# Patient Record
Sex: Male | Born: 1975
Health system: Southern US, Community
[De-identification: ages and names within clinical notes are randomized; demographics above are authoritative.]

## PROBLEM LIST (undated history)

## (undated) DIAGNOSIS — M545 Low back pain: Secondary | ICD-10-CM

## (undated) DIAGNOSIS — E119 Type 2 diabetes mellitus without complications: Secondary | ICD-10-CM

## (undated) DIAGNOSIS — I10 Essential (primary) hypertension: Secondary | ICD-10-CM

## (undated) HISTORY — PX: HERNIA REPAIR: SHX51

## (undated) HISTORY — DX: Low back pain: M54.5

## (undated) HISTORY — DX: Essential (primary) hypertension: I10

---

## 2010-12-17 DIAGNOSIS — E119 Type 2 diabetes mellitus without complications: Secondary | ICD-10-CM

## 2010-12-17 HISTORY — DX: Type 2 diabetes mellitus without complications: E11.9

## 2013-04-13 ENCOUNTER — Emergency Department (HOSPITAL_COMMUNITY)
Admission: EM | Admit: 2013-04-13 | Discharge: 2013-04-13 | Disposition: A | Discharge status: Home / Self Care | Payer: Self-pay | Attending: Emergency Medicine | Admitting: Emergency Medicine

## 2013-04-13 ENCOUNTER — Encounter (HOSPITAL_COMMUNITY): Payer: Self-pay | Admitting: Emergency Medicine

## 2013-04-13 DIAGNOSIS — Z79899 Other long term (current) drug therapy: Secondary | ICD-10-CM | POA: Insufficient documentation

## 2013-04-13 DIAGNOSIS — M79605 Pain in left leg: Secondary | ICD-10-CM

## 2013-04-13 DIAGNOSIS — IMO0001 Reserved for inherently not codable concepts without codable children: Secondary | ICD-10-CM | POA: Insufficient documentation

## 2013-04-13 DIAGNOSIS — L0390 Cellulitis, unspecified: Secondary | ICD-10-CM

## 2013-04-13 DIAGNOSIS — L02419 Cutaneous abscess of limb, unspecified: Secondary | ICD-10-CM | POA: Insufficient documentation

## 2013-04-13 DIAGNOSIS — I10 Essential (primary) hypertension: Secondary | ICD-10-CM | POA: Insufficient documentation

## 2013-04-13 DIAGNOSIS — M79609 Pain in unspecified limb: Secondary | ICD-10-CM | POA: Insufficient documentation

## 2013-04-13 DIAGNOSIS — E119 Type 2 diabetes mellitus without complications: Secondary | ICD-10-CM | POA: Insufficient documentation

## 2013-04-13 HISTORY — DX: Type 2 diabetes mellitus without complications: E11.9

## 2013-04-13 LAB — GLUCOSE, CAPILLARY
Glucose-Capillary: 301 mg/dL — ABNORMAL HIGH (ref 70–99)
Glucose-Capillary: 200 mg/dL — ABNORMAL HIGH (ref 70–99)

## 2013-04-13 MED ORDER — CEPHALEXIN 500 MG PO CAPS: 500.0000 mg | ORAL_CAPSULE | Freq: Four times a day (QID) | ORAL | Status: DC

## 2013-04-13 MED ORDER — SULFAMETHOXAZOLE-TRIMETHOPRIM 800-160 MG PO TABS: ORAL_TABLET | ORAL | Status: DC

## 2013-04-13 NOTE — ED Notes (Signed)
PA completed US at bedside.  Pt tolerated well.

## 2013-04-13 NOTE — ED Notes (Signed)
Pt c/o left leg pain x 2 days; pt denies obvious injury; small amount of redness noted

## 2013-04-13 NOTE — ED Notes (Addendum)
C/O red area to left thigh about 3in X 4 in. Area is red, warm and swollen. No known injury. Onset 2 days ago.

## 2013-04-13 NOTE — ED Provider Notes (Signed)
History     CSN: [id number]  Arrival date & time 04/13/13  6213   First MD Initiated Contact with Patient 04/13/13 [phone]      Chief Complaint  Patient presents with  . Leg Pain    (Consider location/radiation/quality/duration/timing/severity/associated sxs/prior treatment) HPI Comments: Corey Graham is a 37 y/o M with PMHx of DM and HTN presenting to the ED with left thigh discomfort. Patient reported that when he woke up on Saturday morning he was experiencing discomfort to the lateral aspect of left thigh. Patient reported that pain is described as a constant burning sensation, that worsens with walking due to radiation of pain going up the left leg. Patient reported the has been resting, using Tylenol with minimal relief. Denied fall, trauma, injury. Denied fever, chills, numbness, paresthesias, headache, dizziness, gi symptoms, urinary symptoms, saddle anesthesia, bowel incontinence, loss of sensation, chest pain, shortness of breathe, difficulty breathing.   The history is provided by the patient and the spouse. The history is limited by a language barrier. No language interpreter was used.    Past Medical History  Diagnosis Date  . Diabetes mellitus without complication     History reviewed. No pertinent past surgical history.  History reviewed. No pertinent family history.  History  Substance Use Topics  . Smoking status: Never Smoker   . Smokeless tobacco: Not on file  . Alcohol Use: No      Review of Systems  Constitutional: Negative for fever and chills.  HENT: Negative for ear pain, sore throat, neck pain and neck stiffness.   Eyes: Negative for pain and visual disturbance.  Respiratory: Negative for cough, chest tightness and shortness of breath.   Cardiovascular: Negative for chest pain.  Gastrointestinal: Negative for nausea, vomiting, abdominal pain, diarrhea and constipation.  Genitourinary: Negative for dysuria, hematuria, decreased urine  volume and difficulty urinating.  Musculoskeletal: Positive for myalgias. Negative for back pain.       Left thigh pain  Skin: Negative for rash.  Neurological: Negative for dizziness, weakness, light-headedness, numbness and headaches.  All other systems reviewed and are negative.    Allergies  Review of patient's allergies indicates no known allergies.  Home Medications   Current Outpatient Rx  Name  Route  Sig  Dispense  Refill  . acetaminophen (TYLENOL) 500 MG tablet   Oral   Take 1,000 mg by mouth every 6 (six) hours as needed for pain.         Marland Kitchen lisinopril (PRINIVIL,ZESTRIL) 5 MG tablet   Oral   Take 5 mg by mouth daily.         . metFORMIN (GLUCOPHAGE) 500 MG tablet   Oral   Take 500 mg by mouth 2 (two) times daily with a meal.         . cephALEXin (KEFLEX) 500 MG capsule   Oral   Take 1 capsule (500 mg total) by mouth 4 (four) times daily.   40 capsule   0   . sulfamethoxazole-trimethoprim (BACTRIM DS,SEPTRA DS) 800-160 MG per tablet      One tablet PO BID x 10 days   20 tablet   0     BP 132/99  Pulse 84  Temp(Src) 97.9 F (36.6 C) (Oral)  Resp 16  SpO2 98%  Physical Exam  Nursing note and vitals reviewed. Constitutional: He is oriented to person, place, and time. He appears well-developed and well-nourished. No distress.  HENT:  Head: Normocephalic and atraumatic.  Mouth/Throat: Oropharynx is clear and moist.  No oropharyngeal exudate.  Uvula midline, symmetrical elevation  Eyes: Conjunctivae and EOM are normal. Pupils are equal, round, and reactive to light. Right eye exhibits no discharge. Left eye exhibits no discharge.  Neck: Normal range of motion. Neck supple. No tracheal deviation present.  Negative lymphadenopathy  Cardiovascular: Normal rate, regular rhythm and normal heart sounds.  Exam reveals no friction rub.   No murmur heard. Radial pulses 2+ bilaterally Pedal pulses 2+ bilaterally Negative ankle and leg swelling Negative  pitting edema  Pulmonary/Chest: Effort normal and breath sounds normal. No respiratory distress. He has no wheezes. He has no rales.  Musculoskeletal: He exhibits tenderness. He exhibits no edema.       Legs: Limited ROM to left knee secondary to pain from left lateral aspect of thigh Pain upon palpation to lateral aspect of left thigh Strength 5+/5+ lower extremities bilaterally  Lymphadenopathy:    He has no cervical adenopathy.  Neurological: He is alert and oriented to person, place, and time. No cranial nerve deficit. He exhibits normal muscle tone. Coordination normal.  Sensation intact with differentiation of sharp and dull sensation to lower extremities bilaterally  Skin: Skin is warm and dry. No rash noted. He is not diaphoretic. There is erythema.     Erythema noted to lateral aspect of left thigh, mild swelling noted.   Psychiatric: He has a normal mood and affect. His behavior is normal. Thought content normal.    ED Course  Procedures (including critical care time)  11:40AM [place] bedside performed negative finding of fluid accumulation, no abscess or cyst noted.   Labs Reviewed  GLUCOSE, CAPILLARY - Abnormal; Notable for the following:    Glucose-Capillary 301 (*)    All other components within normal limits  GLUCOSE, CAPILLARY - Abnormal; Notable for the following:    Glucose-Capillary 200 (*)    All other components within normal limits   No results found.   1. Cellulitis   2. Leg pain, left   3. HTN (hypertension)   4. DM (diabetes mellitus)       MDM  I personally evaluated and examined the patient. Patient afebrile, normotensive, non-tachycardic, alert and oriented. Bedside ultrasound performed - no accumulation of fluids noted - r/o abscess. Tenderness upon palpation to the lateral aspect of the left leg - decreased ROM to left knee secondary to pain in left thigh. Discussed findings and case with Dr. Ranae Palms, Dr. Ranae Palms saw and examined patient -  reported case of possible cellulitis - recommended patient be discharged with antibiotics and to follow-up as outpatient. Patient aseptic, non-toxic appearing, in no acute distress. No neurovascular damage noted. No sign of septic joint - left knee mobile, no erythema, no swelling, no warmth to touch, no effusion. No abscess noted from bedside US. Discharged patient. Possible cellulitis. Discharged patient with antibiotics - bactrim and keflex. Discussed with patient to follow-up with urgent care center by the end of this week for re-check. Discussed with patient to rest, elevate leg, ice, no strenuous activity. Note given for work. Discussed with patient to continue taking home medications as prescribed and to monitor glucose levels. Discussed with patient to monitor symptoms and if symptoms are to worsen or change to report back to the ED. Patient agreed to plan of care, understood, all questions answered.         Raymon Mutton, PA-C 04/13/13 2139 Patient's primary language is Spanish - interpreter was wife that was with him who speaks [place].  Raymon Mutton, PA-C 04/14/13 [phone]

## 2013-04-14 NOTE — ED Provider Notes (Signed)
Medical screening examination/treatment/procedure(s) were conducted as a shared visit with non-physician practitioner(s) and myself.  I personally evaluated the patient during the encounter   Loren Racer, MD 04/14/13 1743

## 2014-06-15 ENCOUNTER — Emergency Department (HOSPITAL_COMMUNITY): Payer: Self-pay

## 2014-06-15 ENCOUNTER — Encounter (HOSPITAL_COMMUNITY): Payer: Self-pay | Admitting: Emergency Medicine

## 2014-06-15 ENCOUNTER — Emergency Department (HOSPITAL_COMMUNITY)
Admission: EM | Admit: 2014-06-15 | Discharge: 2014-06-16 | Disposition: A | Discharge status: Home / Self Care | Payer: Self-pay | Attending: Emergency Medicine | Admitting: Emergency Medicine

## 2014-06-15 DIAGNOSIS — Z9119 Patient's noncompliance with other medical treatment and regimen: Secondary | ICD-10-CM | POA: Insufficient documentation

## 2014-06-15 DIAGNOSIS — Z91199 Patient's noncompliance with other medical treatment and regimen due to unspecified reason: Secondary | ICD-10-CM | POA: Insufficient documentation

## 2014-06-15 DIAGNOSIS — E119 Type 2 diabetes mellitus without complications: Secondary | ICD-10-CM | POA: Insufficient documentation

## 2014-06-15 DIAGNOSIS — S9030XA Contusion of unspecified foot, initial encounter: Secondary | ICD-10-CM | POA: Insufficient documentation

## 2014-06-15 DIAGNOSIS — S91115A Laceration without foreign body of left lesser toe(s) without damage to nail, initial encounter: Secondary | ICD-10-CM

## 2014-06-15 DIAGNOSIS — Z91148 Patient's other noncompliance with medication regimen for other reason: Secondary | ICD-10-CM

## 2014-06-15 DIAGNOSIS — R739 Hyperglycemia, unspecified: Secondary | ICD-10-CM

## 2014-06-15 DIAGNOSIS — Z79899 Other long term (current) drug therapy: Secondary | ICD-10-CM | POA: Insufficient documentation

## 2014-06-15 DIAGNOSIS — S91109A Unspecified open wound of unspecified toe(s) without damage to nail, initial encounter: Secondary | ICD-10-CM | POA: Insufficient documentation

## 2014-06-15 DIAGNOSIS — Y929 Unspecified place or not applicable: Secondary | ICD-10-CM | POA: Insufficient documentation

## 2014-06-15 DIAGNOSIS — W64XXXA Exposure to other animate mechanical forces, initial encounter: Secondary | ICD-10-CM | POA: Insufficient documentation

## 2014-06-15 DIAGNOSIS — Z9114 Patient's other noncompliance with medication regimen: Secondary | ICD-10-CM

## 2014-06-15 DIAGNOSIS — Y9389 Activity, other specified: Secondary | ICD-10-CM | POA: Insufficient documentation

## 2014-06-15 DIAGNOSIS — S9032XA Contusion of left foot, initial encounter: Secondary | ICD-10-CM

## 2014-06-15 LAB — I-STAT VENOUS BLOOD GAS, ED
BICARBONATE: 25.1 meq/L — AB (ref 20.0–24.0)
O2 Saturation: 85 %
PCO2 VEN: 41.5 mmHg — AB (ref 45.0–50.0)
PH VEN: 7.389 — AB (ref 7.250–7.300)
PO2 VEN: 50 mmHg — AB (ref 30.0–45.0)
TCO2: 26 mmol/L (ref 0–100)

## 2014-06-15 LAB — CBG MONITORING, ED
GLUCOSE-CAPILLARY: 345 mg/dL — AB (ref 70–99)
Glucose-Capillary: 316 mg/dL — ABNORMAL HIGH (ref 70–99)

## 2014-06-15 LAB — I-STAT CHEM 8, ED
BUN: 13 mg/dL (ref 6–23)
CHLORIDE: 101 meq/L (ref 96–112)
Calcium, Ion: 1.23 mmol/L (ref 1.12–1.23)
Creatinine, Ser: 0.5 mg/dL (ref 0.50–1.35)
Glucose, Bld: 378 mg/dL — ABNORMAL HIGH (ref 70–99)
HCT: 49 % (ref 39.0–52.0)
Hemoglobin: 16.7 g/dL (ref 13.0–17.0)
Potassium: 3.8 mEq/L (ref 3.7–5.3)
SODIUM: 136 meq/L — AB (ref 137–147)
TCO2: 26 mmol/L (ref 0–100)

## 2014-06-15 MED ORDER — CEPHALEXIN 250 MG PO CAPS
500.0000 mg | ORAL_CAPSULE | Freq: Once | ORAL | Status: AC
  Administered 2014-06-15: 500 mg via ORAL
  Filled 2014-06-15: qty 2

## 2014-06-15 MED ORDER — SODIUM CHLORIDE 0.9 % IV BOLUS (SEPSIS)
1000.0000 mL | Freq: Once | INTRAVENOUS | Status: AC
Start: 2014-06-15 — End: 2014-06-15
  Administered 2014-06-15: 1000 mL via INTRAVENOUS

## 2014-06-15 MED ORDER — BACITRACIN 500 UNIT/GM EX OINT
1.0000 "application " | TOPICAL_OINTMENT | Freq: Two times a day (BID) | CUTANEOUS | Status: DC
  Administered 2014-06-15: 1 via TOPICAL

## 2014-06-15 NOTE — ED Provider Notes (Signed)
CSN: [id number]     Arrival date & time 06/15/14  2022 History  This chart was scribed for Wynetta Emery, PA-C working with Rolland Porter, MD by Evon Slack, ED Scribe. This patient was seen in room TR06C/TR06C and the patient's care was started at 8:42 PM.    Chief Complaint  Patient presents with  . Foot Injury   The history is provided by the patient. No language interpreter was used.   HPI Comments: Corey Graham is a 38 y.o. male who presents to the Emergency Department complaining of left food injury. Pt states that a cow stepped on his foot this afternoon. The pain is 5/10, exacerbated by weightbearing. He states that he has take tylenol with some relief. He states that his last tetanus shot was 3 or 4 years ago. Pt also states that he has a history of diabetes. Noncompliant with metformin for over a year. Denies polydipsia or polyuria.  Past Medical History  Diagnosis Date  . Diabetes mellitus without complication    History reviewed. No pertinent past surgical history. History reviewed. No pertinent family history. History  Substance Use Topics  . Smoking status: Never Smoker   . Smokeless tobacco: Not on file  . Alcohol Use: No    Review of Systems  Musculoskeletal: Positive for arthralgias.  Skin: Positive for wound.   A complete 10 system review of systems was obtained and all systems are negative except as noted in the HPI and PMH.     Allergies  Review of patient's allergies indicates no known allergies.  Home Medications   Prior to Admission medications   Medication Sig Start Date End Date Taking? Authorizing Provider  acetaminophen (TYLENOL) 500 MG tablet Take 500 mg by mouth every 6 (six) hours as needed (pain).    Yes Historical Provider, MD  cephALEXin (KEFLEX) 500 MG capsule Take 1 capsule (500 mg total) by mouth 4 (four) times daily. 06/16/14   Nicole Pisciotta, PA-C  metFORMIN (GLUCOPHAGE) 500 MG tablet Take 1 tablet (500 mg total) by mouth  2 (two) times daily with a meal. 06/16/14   Nicole Pisciotta, PA-C  traMADol (ULTRAM) 50 MG tablet Take 1 tablet (50 mg total) by mouth every 6 (six) hours as needed. 06/16/14   Nicole Pisciotta, PA-C   Triage Vitals: BP 140/87  Pulse 81  Temp(Src) 98.1 F (36.7 C) (Oral)  Resp 18  Ht 5\' 5"  (1.651 m)  Wt 176 lb (79.833 kg)  BMI 29.29 kg/m2  SpO2 98%  Physical Exam  Nursing note and vitals reviewed. Constitutional: He is oriented to person, place, and time. He appears well-developed and well-nourished. No distress.  HENT:  Head: Normocephalic and atraumatic.  Right Ear: External ear normal.  Mouth/Throat: Oropharynx is clear and moist.  Eyes: Conjunctivae and EOM are normal. Pupils are equal, round, and reactive to light.  Neck: Normal range of motion. Neck supple. No tracheal deviation present.  Cardiovascular: Normal rate, regular rhythm and intact distal pulses.   Pulmonary/Chest: Effort normal and breath sounds normal. No stridor. No respiratory distress. He has no wheezes. He has no rales. He exhibits no tenderness.  Abdominal: Soft. Bowel sounds are normal. He exhibits no distension and no mass. There is no tenderness. There is no rebound and no guarding.  Musculoskeletal: Normal range of motion.       Feet:  Neurological: He is alert and oriented to person, place, and time.  Skin: Skin is warm and dry.  Psychiatric: He has a normal mood  and affect. His behavior is normal.    ED Course  Procedures (including critical care time) DIAGNOSTIC STUDIES: Oxygen Saturation is 98% on RA, normal by my interpretation.    COORDINATION OF CARE:    Labs Review Labs Reviewed  CBG MONITORING, ED - Abnormal; Notable for the following:    Glucose-Capillary 345 (*)    All other components within normal limits  I-STAT CHEM 8, ED - Abnormal; Notable for the following:    Sodium 136 (*)    Glucose, Bld 378 (*)    All other components within normal limits  I-STAT VENOUS BLOOD GAS, ED -  Abnormal; Notable for the following:    pH, Ven 7.389 (*)    pCO2, Ven 41.5 (*)    pO2, Ven 50.0 (*)    Bicarbonate 25.1 (*)    All other components within normal limits  CBG MONITORING, ED - Abnormal; Notable for the following:    Glucose-Capillary 316 (*)    All other components within normal limits  BLOOD GAS, VENOUS    Imaging Review Dg Foot Complete Left  06/15/2014   CLINICAL DATA:  Larey SeatFell from couch.  Second toe pain  EXAM: LEFT FOOT - COMPLETE 3+ VIEW  COMPARISON:  None.  FINDINGS: There is no evidence of fracture or dislocation. There is no evidence of arthropathy or other focal bone abnormality. Soft tissues are unremarkable.  IMPRESSION: Negative.   Electronically Signed   By: Signa Kellaylor  Stroud M.D.   On: 06/15/2014 21:36     EKG Interpretation None      MDM   Final diagnoses:  Hyperglycemia without ketosis  Foot contusion, left, initial encounter  Non compliance w medication regimen  Laceration of fourth toe, left, initial encounter   Filed Vitals:   06/15/14 2028 06/16/14 0010  BP: 140/87 140/94  Pulse: 81 72  Temp: 98.1 F (36.7 C) 97 F (36.1 C)  TempSrc: Oral Oral  Resp: 18 16  Height: 5\' 5"  (1.651 m)   Weight: 176 lb (79.833 kg)   SpO2: 98% 96%    Medications  bacitracin ointment 1 application (1 application Topical Given 06/15/14 2156)  cephALEXin (KEFLEX) capsule 500 mg (500 mg Oral Given 06/15/14 2156)  sodium chloride 0.9 % bolus 1,000 mL (0 mLs Intravenous Stopped 06/15/14 2327)    Corey DeutscherWinston Graham is a 38 y.o. male presenting with left foot pain after a cow stepped on it just prior to arrival. Pain is moderate in relief with Tylenol. Patient declines crutches states he has some at home. X-rays negative. Excellent range of motion patient is neurovascularly intact. There is a small laceration to the distal second digit. Bleeding is controlled.   Patient's wife tells me that he has been noncompliant with his diabetes medications for over a  year. States that he would take them but he just doesn't have insurance and doesn't have money to see a doctor. We have discussed how important it is to have very good wound hygiene and to keep an eye out for signs of infection especially because he is diabetic. Patient and wife verbalizes her understanding. CBG is elevated at 345. I stat Chem-8 with no gross abnormalities and CBG is non-acidotic. Patient is bolused and recheck shows glucose is 316. Patient amenable to discharge and will call to make an appointment at the wellness Center tomorrow morning.  Evaluation does not show pathology that would require ongoing emergent intervention or inpatient treatment. Pt is hemodynamically stable and mentating appropriately. Discussed findings and plan with  patient/guardian, who agrees with care plan. All questions answered. Return precautions discussed and outpatient follow up given.   New Prescriptions   CEPHALEXIN (KEFLEX) 500 MG CAPSULE    Take 1 capsule (500 mg total) by mouth 4 (four) times daily.   METFORMIN (GLUCOPHAGE) 500 MG TABLET    Take 1 tablet (500 mg total) by mouth 2 (two) times daily with a meal.   TRAMADOL (ULTRAM) 50 MG TABLET    Take 1 tablet (50 mg total) by mouth every 6 (six) hours as needed.          I personally performed the services described in this documentation, which was scribed in my presence. The recorded information has been reviewed and is accurate.      Wynetta Emeryicole Pisciotta, PA-C 06/16/14 [phone]

## 2014-06-15 NOTE — ED Notes (Signed)
Patient presents stating a cow stepped on his left second and third toe.  Toes look swollen

## 2014-06-16 MED ORDER — METFORMIN HCL 500 MG PO TABS: 500.0000 mg | ORAL_TABLET | Freq: Two times a day (BID) | ORAL | Status: DC

## 2014-06-16 MED ORDER — CEPHALEXIN 500 MG PO CAPS: 500.0000 mg | ORAL_CAPSULE | Freq: Four times a day (QID) | ORAL | Status: DC

## 2014-06-16 MED ORDER — TRAMADOL HCL 50 MG PO TABS: 50.0000 mg | ORAL_TABLET | Freq: Four times a day (QID) | ORAL | Status: DC | PRN

## 2014-06-16 NOTE — Discharge Instructions (Signed)
Si ve signos de infeccin ( calor, enrojecimiento , sensibilidad, pus, fuerte incremento en el dolor , la fiebre , vetas rojas ) inmediatamente volver a la sala de urgencias .  Tome sus antibiticos segn las indicaciones y Elaina [place] su finalizacin. Usted nunca debera tener ningn antibiticos sobrantes ! Empuje lquidos y Sunocomantenerse bien hidratado.  Para el control del dolor puede tomar : Motrin y / o Tylenol . Para el dolor irruptivo usted puede tomar Tramadol . No beba alcohol en coche o manejar maquinaria pesada al tomar Tramadol .  No dudara en volver a la sala de urgencias para cualquier nuevo empeoramiento ni respecto de los sntomas.  Si usted no tiene un mdico de atencin primaria puede establecer una en la  [facility]: [address] [place] [id number] [phone]  Despus de establecer la atencin . Hgales saber que te vieron en la sala de emergencia. Deben obtener los registros para Programmer, multimediasu manejo ulterior .  If you see signs of infection (warmth, redness, tenderness, pus, sharp increase in pain, fever, red streaking) immediately return to the emergency department.  Take your antibiotics as directed and to completion. You should never have any leftover antibiotics! Push fluids and stay well hydrated.   Do not hesitate to return to the Emergency Department for any new, worsening or concerning symptoms.   If you do not have a primary care doctor you can establish one at the   [facility]: [address] [place] [id number] [phone]  After you establish care. Let them know you were seen in the emergency room. They must obtain records for further management.   For pain control you may take:  Motrin and/or Tylenol. For breakthrough pain you may take Tramadol. Do not drink alcohol drive or operate heavy machinery when taking Tramadol.

## 2014-06-22 NOTE — ED Provider Notes (Signed)
Medical screening examination/treatment/procedure(s) were performed by non-physician practitioner and as supervising physician I was immediately available for consultation/collaboration.   EKG Interpretation None        Mark James, MD 06/22/14 0301 

## 2014-07-07 ENCOUNTER — Ambulatory Visit: Payer: Self-pay

## 2015-10-28 ENCOUNTER — Emergency Department (HOSPITAL_COMMUNITY): Payer: Self-pay

## 2015-10-28 ENCOUNTER — Emergency Department (HOSPITAL_COMMUNITY)
Admission: EM | Admit: 2015-10-28 | Discharge: 2015-10-28 | Disposition: A | Discharge status: Home / Self Care | Payer: Self-pay | Attending: Emergency Medicine | Admitting: Emergency Medicine

## 2015-10-28 ENCOUNTER — Encounter (HOSPITAL_COMMUNITY): Payer: Self-pay | Admitting: Nurse Practitioner

## 2015-10-28 DIAGNOSIS — J069 Acute upper respiratory infection, unspecified: Secondary | ICD-10-CM | POA: Insufficient documentation

## 2015-10-28 DIAGNOSIS — Z792 Long term (current) use of antibiotics: Secondary | ICD-10-CM | POA: Insufficient documentation

## 2015-10-28 DIAGNOSIS — J219 Acute bronchiolitis, unspecified: Secondary | ICD-10-CM | POA: Insufficient documentation

## 2015-10-28 DIAGNOSIS — E119 Type 2 diabetes mellitus without complications: Secondary | ICD-10-CM | POA: Insufficient documentation

## 2015-10-28 DIAGNOSIS — Z79899 Other long term (current) drug therapy: Secondary | ICD-10-CM | POA: Insufficient documentation

## 2015-10-28 MED ORDER — IPRATROPIUM-ALBUTEROL 0.5-2.5 (3) MG/3ML IN SOLN
3.0000 mL | Freq: Once | RESPIRATORY_TRACT | Status: AC
  Administered 2015-10-28: 3 mL via RESPIRATORY_TRACT
  Filled 2015-10-28: qty 3

## 2015-10-28 MED ORDER — GUAIFENESIN ER 600 MG PO TB12: 600.0000 mg | ORAL_TABLET | Freq: Two times a day (BID) | ORAL | Status: DC

## 2015-10-28 MED ORDER — BENZONATATE 100 MG PO CAPS: 100.0000 mg | ORAL_CAPSULE | Freq: Two times a day (BID) | ORAL | Status: DC | PRN

## 2015-10-28 MED ORDER — ALBUTEROL SULFATE HFA 108 (90 BASE) MCG/ACT IN AERS: 2.0000 | INHALATION_SPRAY | Freq: Four times a day (QID) | RESPIRATORY_TRACT | Status: DC | PRN

## 2015-10-28 NOTE — ED Notes (Addendum)
He reports painful cough increasingly worse over past 2 weeks, no fevers, n/v. One time he coughed and saw blood in his sputum but otherwise has been dry. Cough is worse at night. Denies weight loss. He has had night sweats. Denies weight loss. A&Ox4, resp e/u

## 2015-10-28 NOTE — Discharge Instructions (Signed)
Your x-ray today shows bronchiolitis, or inflammation of the small airways of your lungs. This is likely due to a virus. As we discussed, I will give you a prescription for an albuterol inhaler to use as needed for shortness of breath. You will also receive a prescription for tessalon, a cough medicine, and mucinex, an expectorant. You may use tylenol or advil as needed for pain. If you develop new or concerning symptoms, return to the ER.  Take medications as prescribed. Return to the emergency room for worsening condition or new concerning symptoms. Follow up with your regular doctor. If you don't have a regular doctor use one of the numbers below to establish a primary care doctor.   Emergency Department Resource Guide 1) Find a Doctor and Pay Out of Pocket Although you won't have to find out who is covered by your insurance plan, it is a good idea to ask around and get recommendations. You will then need to call the office and see if the doctor you have chosen will accept you as a new patient and what types of options they offer for patients who are self-pay. Some doctors offer discounts or will set up payment plans for their patients who do not have insurance, but you will need to ask so you aren't surprised when you get to your appointment.  2) Contact Your Local Health Department Not all health departments have doctors that can see patients for sick visits, but many do, so it is worth a call to see if yours does. If you don't know where your local health department is, you can check in your phone book. The CDC also has a tool to help you locate your state's health department, and many state websites also have listings of all of their local health departments.  3) Find a Walk-in Clinic If your illness is not likely to be very severe or complicated, you may want to try a walk in clinic. These are popping up all over the country in pharmacies, drugstores, and shopping centers. They're usually  staffed by nurse practitioners or physician assistants that have been trained to treat common illnesses and complaints. They're usually fairly quick and inexpensive. However, if you have serious medical issues or chronic medical problems, these are probably not your best option.  No Primary Care Doctor: - Call Health Connect at  [phone] - they can help you locate a primary care doctor that  accepts your insurance, provides certain services, etc. - Physician Referral [phone]- [phone]  Emergency Department Resource Guide 1) Find a Doctor and Pay Out of Pocket Although you won't have to find out who is covered by your insurance plan, it is a good idea to ask around and get recommendations. You will then need to call the office and see if the doctor you have chosen will accept you as a new patient and what types of options they offer for patients who are self-pay. Some doctors offer discounts or will set up payment plans for their patients who do not have insurance, but you will need to ask so you aren't surprised when you get to your appointment.  2) Contact Your Local Health Department Not all health departments have doctors that can see patients for sick visits, but many do, so it is worth a call to see if yours does. If you don't know where your local health department is, you can check in your phone book. The CDC also has a tool to help you locate your state's  health department, and many state websites also have listings of all of their local health departments.  3) Find a Walk-in Clinic If your illness is not likely to be very severe or complicated, you may want to try a walk in clinic. These are popping up all over the country in pharmacies, drugstores, and shopping centers. They're usually staffed by nurse practitioners or physician assistants that have been trained to treat common illnesses and complaints. They're usually fairly quick and inexpensive. However, if you have serious medical  issues or chronic medical problems, these are probably not your best option.  No Primary Care Doctor: - Call Health Connect at  [phone] - they can help you locate a primary care doctor that  accepts your insurance, provides certain services, etc. - Physician Referral Service- [phone]  Chronic Pain Problems: Organization         Address  Phone   Notes  [facility]  [phone] Patients need to be referred by their primary care doctor.   Medication Assistance: Organization         Address  Phone   Notes  [facility] [address] [place] [place], [place] [address] [phone] --Must be a resident of [facility] -- Must have NO insurance coverage whatsoever (no Medicaid/ Medicare, etc.) -- The pt. MUST have a primary care doctor that directs their care regularly and follows them in the community   [facility]  [phone]   [employer]  [phone]    Agencies that provide inexpensive medical care: Organization         Address  Phone   Notes  [facility]  [phone]   [facility]    [phone]   [facility] [address] [place], [place] [address] [phone]   [facility] [address] [place] [phone]   [facility]    [phone]   [facility]    [phone]   [facility]  [address], [place] Phone:  [phone], Fax:  [phone] Hours of Operation:  9 am - 6 pm, M-F.  Also accepts Medicaid/Medicare and self-pay.  [facility]  [address], [place] Phone: [phone], Fax: [phone]. Hours of Operation:  8:30 am - 5:30 pm, M-F.  Also accepts Medicaid and self-pay.  [facility] [place] [address] [place] Phone: [phone]   [facility] [address] [place] [employer], [place]  [phone], Ext. [phone] Mondays & Thursdays: 7-9 AM.  First 15 patients are seen on a first come, first serve basis.    Medicaid-accepting [facility] Providers:  Organization         Address  Phone   Notes  [facility] [address], [place] [phone] Also accepts self-pay patients.  [facility] [address] Laurell Josephs [place], [place]  [phone]   [facility] [address], [place] [phone]   [facility] [address], [place] [phone]   Renaye Rakers [address], [place]   [phone] Only accepts [place] patients after they have their name applied to their card.   Self-Pay (no insurance) in [facility]:  Organization  Address  Phone   Notes  Sickle Cell Patients, [facility] [place] [phone]   [facility] [place] [phone]   [facility] [place]  [address], [place] [phone]   [facility]/Dr. Osei-Bonsu  [address] [place] or [address], [place] [phone] Phone number for both [place] and [employer] locations is the same.  [facility] [address] [place] [phone]   [facility] [address], [place] or [address] [phone]   [facility] [address], [place] [phone], phone; [phone], fax Sees patients 1st and 3rd Saturday of every month.  Must not qualify for public or private insurance (i.e. Medicaid, Medicare, [place] Health Choice, Veterans' Benefits)  Household income should be no more than 200% of the poverty level The clinic cannot treat you if you are pregnant or think you are pregnant  Sexually transmitted  diseases are not treated at the clinic.

## 2015-10-28 NOTE — ED Provider Notes (Signed)
CSN: [id number]     Arrival date & time 10/28/15  1234 History   First MD Initiated Contact with Patient 10/28/15 1500     Chief Complaint  Patient presents with  . Cough    HPI   [name] is an 39 y.o. male with history of diet-controlled DM who presents to the ED for evaluation of cough. He speaks [place] and some [place]; the history is partially obtained with his son translating. Pt reports he was in his usual state of health until about two weeks ago when he began experiencing a cough.  He states that the cough is mostly dry but he sometimes feels congestion in his chest. He states that the cough has been particularly worse the past two nights and feels that he has had chest tightness and SOB at night. He also endorses chest tightness and diffuse rib pain when he has his coughing fits. Denies any other URI symptoms at home including fever, sore throat, abd pain, N/V/D. He has not tried anything to alleviate his symptoms. Denies tobacco, EtOH, or other drug use. Denies flu shot this year. Denies recent travel.   Past Medical History  Diagnosis Date  . Diabetes mellitus without complication (HCC)    History reviewed. No pertinent past surgical history. History reviewed. No pertinent family history. Social History  Substance Use Topics  . Smoking status: Never Smoker   . Smokeless tobacco: None  . Alcohol Use: No    Review of Systems  All other systems reviewed and are negative.     Allergies  Review of patient's allergies indicates no known allergies.  Home Medications   Prior to Admission medications   Medication Sig Start Date End Date Taking? Authorizing Provider  acetaminophen (TYLENOL) 500 MG tablet Take 500 mg by mouth every 6 (six) hours as needed (pain).     Historical Provider, MD  cephALEXin (KEFLEX) 500 MG capsule Take 1 capsule (500 mg total) by mouth 4 (four) times daily. 06/16/14   Nicole Pisciotta, PA-C  metFORMIN (GLUCOPHAGE) 500 MG tablet Take 1  tablet (500 mg total) by mouth 2 (two) times daily with a meal. 06/16/14   Nicole Pisciotta, PA-C  traMADol (ULTRAM) 50 MG tablet Take 1 tablet (50 mg total) by mouth every 6 (six) hours as needed. 06/16/14   Nicole Pisciotta, PA-C   BP 134/83 mmHg  Pulse 98  Temp(Src) 98.4 F (36.9 C) (Oral)  Resp 16  Wt 173 lb 4.8 oz (78.608 kg)  SpO2 99% Physical Exam  Constitutional: He is oriented to person, place, and time. He appears well-developed and well-nourished. No distress.  HENT:  Head: Atraumatic.  Right Ear: External ear normal.  Left Ear: External ear normal.  Mouth/Throat: Oropharynx is clear and moist. No oropharyngeal exudate.  Eyes: Conjunctivae and EOM are normal. No scleral icterus.  Neck: Normal range of motion. Neck supple. No tracheal deviation present.  Cardiovascular: Normal rate, regular rhythm and normal heart sounds.   Pulmonary/Chest: Effort normal. No accessory muscle usage. No respiratory distress. He exhibits no tenderness.  Diffuse bilateral inspiratory and expiratory wheezes. Diffuse rhonchi and upper airway congestion.   Abdominal: Soft. Bowel sounds are normal. He exhibits no distension. There is no tenderness.  Musculoskeletal: Normal range of motion. He exhibits no edema or tenderness.  Lymphadenopathy:    He has no cervical adenopathy.  Neurological: He is alert and oriented to person, place, and time.  Skin: Skin is warm and dry. He is not diaphoretic.  Nursing note  and vitals reviewed.   ED Course  Procedures (including critical care time) Labs Review Labs Reviewed - No data to display  Imaging Review Dg Chest 2 View  10/28/2015  CLINICAL DATA:  Cough for 2 weeks EXAM: CHEST  2 VIEW COMPARISON:  None. FINDINGS: There is central interstitial prominence bilaterally. There is no edema or consolidation. Heart size and pulmonary vascularity are normal. No adenopathy. No bone lesions. IMPRESSION: Findings consistent with central bronchiolitis. This appearance  may be seen with viral type pneumonitis. No edema or consolidation. No volume loss. Electronically Signed   By: Bretta BangWilliam  Woodruff III M.D.   On: 10/28/2015 15:47   I have personally reviewed and evaluated these images and lab results as part of my medical decision-making.   EKG Interpretation None      MDM   Final diagnoses:  Bronchiolitis  Upper respiratory infection    Likely acute bronchitis. Pt has pretty significant wheezing on exam but has no known history of COPD/RAD. He is afebrile, not tachycardic, and maintaining SpO2 99% on RA so I have a lower suspicion for pneumonia. Will get CXR. Will also give duoneb treatment. Dispo likely d/c home with supportive meds.    CXR significant for central bronchiolitis, possibly 2/2 viral pneumonitis. Discussed findings with pt. He subjectively feels improved after duoneb. Lung sounds much improved with only one soft wheeze in lower R lobe. No indication for abx therapy at this time. Will give pt rx for albuterol inhaler for wheezing prn. Will also give tessalon perles and mucinex. Strict return precautions given including new or worsening symptoms, high fever, chest pain, difficulty breathing. Reso guide given for PCP establishment.    Carlene CoriaSerena Y Sohum Delillo, PA-C 10/28/15 1639  Leta BaptistEmily Roe Nguyen, MD 10/30/15 [phone]

## 2016-10-19 LAB — GLUCOSE, POCT (MANUAL RESULT ENTRY): POC Glucose: 355 mg/dl — AB (ref 70–99)

## 2016-10-30 ENCOUNTER — Ambulatory Visit: Payer: Self-pay | Admitting: Internal Medicine

## 2016-12-25 ENCOUNTER — Encounter: Payer: Self-pay | Admitting: Internal Medicine

## 2016-12-25 ENCOUNTER — Ambulatory Visit (INDEPENDENT_AMBULATORY_CARE_PROVIDER_SITE_OTHER): Payer: Self-pay | Admitting: Internal Medicine

## 2016-12-25 VITALS — BP 124/80 | HR 78 | Resp 12 | Ht 63.5 in | Wt 164.0 lb

## 2016-12-25 DIAGNOSIS — R739 Hyperglycemia, unspecified: Secondary | ICD-10-CM | POA: Insufficient documentation

## 2016-12-25 DIAGNOSIS — E1165 Type 2 diabetes mellitus with hyperglycemia: Secondary | ICD-10-CM | POA: Insufficient documentation

## 2016-12-25 DIAGNOSIS — N481 Balanitis: Secondary | ICD-10-CM

## 2016-12-25 DIAGNOSIS — Z79899 Other long term (current) drug therapy: Secondary | ICD-10-CM

## 2016-12-25 LAB — GLUCOSE, POCT (MANUAL RESULT ENTRY): POC GLUCOSE: 248 mg/dL — AB (ref 70–99)

## 2016-12-25 MED ORDER — FLUCONAZOLE 150 MG PO TABS: 150.0000 mg | ORAL_TABLET | Freq: Once | ORAL | 0 refills | Status: AC

## 2016-12-25 MED ORDER — METFORMIN HCL ER 500 MG PO TB24: 500.0000 mg | ORAL_TABLET | Freq: Every day | ORAL | 11 refills | Status: DC

## 2016-12-25 MED ORDER — AGAMATRIX PRESTO W/DEVICE KIT: PACK | 0 refills | Status: DC

## 2016-12-25 MED ORDER — GLUCOSE BLOOD VI STRP: ORAL_STRIP | 12 refills | Status: DC

## 2016-12-25 NOTE — Progress Notes (Signed)
   Subjective:    Patient ID: Corey Graham, male    DOB: 05/18/1976, 41 y.o.   MRN: [id number]  HPI   Here to establish  1.  DM type 2:  Diagnosed 6 years ago.  [place]oved from [place] to [place] 5 months ago.  Previously went to clinic in [place], but last time was at least 2 years ago.   Last treated his DM 2 years ago as well. Has also not been checking his sugars. He did take Metformin 500 mg once or twice daily, but gave him headache and made him sleepy.  Drinks soda 2-3 times daily  2.  Possible yeast infection of penis:  Wife states she has had yeast infections and now her husband has burning and pain of distal penis.   3.  Flaking, redness and itching of malar area and top of head for a while.  Scalp is better now, but not face.  No change with alcohol or hot beverage intake.   No outpatient prescriptions have been marked as taking for the 12/25/16 encounter (Office Visit) with Julieanne MansonElizabeth Rheana Casebolt, MD.    No Known Allergies       Review of Systems     Objective:   Physical Exam  HEENT:  PERRL, EOMI, malar and nasolabial folds with mild erythema, flaking.  Minimally in glabellar area Neck:  Supple, No adenopathy Chest:  CTA CV:  RRR with normal S1 and S2, No S3, S4 or murmur, radial pulses normal and equal. GU:  Unable to retract foreskin fully due to erythema of underside of foreskin with edema and painful fissuring.        Assessment & Plan:  1.  DM:  Long discussion regarding lifestyle change with family with eating and physical activity.   Discussed complications of DM long term if not controlled Try Metformin ER 500 mg twice daily with meals. A1C for baseline Glucometer and test strips Instruction on documenting sugars and bringing in documentation at next visit in 6 weeks. Wife with morbid obesity and establishing next week. Eye referral.  2.  Balanitis:  Discussed need to control DM.  Fluconazole 150 mg daily for 3 days.  3.  Seborrhea of  face:  Terbinafine cream twice daily to affected area.

## 2016-12-26 LAB — COMPREHENSIVE METABOLIC PANEL
A/G RATIO: 1.4 (ref 1.2–2.2)
ALT: 27 IU/L (ref 0–44)
AST: 23 IU/L (ref 0–40)
Albumin: 4.2 g/dL (ref 3.5–5.5)
Alkaline Phosphatase: 135 IU/L — ABNORMAL HIGH (ref 39–117)
BILIRUBIN TOTAL: 0.4 mg/dL (ref 0.0–1.2)
BUN/Creatinine Ratio: 18 (ref 9–20)
BUN: 12 mg/dL (ref 6–20)
CALCIUM: 9.5 mg/dL (ref 8.7–10.2)
CO2: 24 mmol/L (ref 18–29)
Chloride: 96 mmol/L (ref 96–106)
Creatinine, Ser: 0.68 mg/dL — ABNORMAL LOW (ref 0.76–1.27)
GFR calc Af Amer: 139 mL/min/{1.73_m2} (ref >59)
GFR, EST NON AFRICAN AMERICAN: 120 mL/min/{1.73_m2} (ref >59)
GLOBULIN, TOTAL: 3 g/dL (ref 1.5–4.5)
Glucose: 214 mg/dL — ABNORMAL HIGH (ref 65–99)
POTASSIUM: 4.7 mmol/L (ref 3.5–5.2)
SODIUM: 136 mmol/L (ref 134–144)
Total Protein: 7.2 g/dL (ref 6.0–8.5)

## 2016-12-26 LAB — HGB A1C W/O EAG: Hgb A1c MFr Bld: 12.9 % — ABNORMAL HIGH (ref 4.8–5.6)

## 2017-01-30 ENCOUNTER — Other Ambulatory Visit: Payer: Self-pay | Admitting: Licensed Clinical Social Worker

## 2017-02-01 NOTE — Progress Notes (Signed)
Patient was notified of lab results. He would like Dr. Delrae AlfredMulberry to know he works from 2 am to 8am and says he snacks at work with cookies, coffee, or tea whenever has a few minutes to rest. When he gets home he checks his blood sugar levels and are usually 209,238, or 264. He then eats breakfast which is usually a fried egg and beans and milk or cereal. He works again from 2pm-6pm and eats again when he gets home at 6pm. Usually piece of stake with some beans and about 8 to 10 tortillas and sometimes has a second helping of food.

## 2017-02-14 ENCOUNTER — Ambulatory Visit: Payer: Self-pay | Admitting: Internal Medicine

## 2017-03-13 ENCOUNTER — Ambulatory Visit: Payer: Self-pay | Admitting: Internal Medicine

## 2017-06-05 ENCOUNTER — Ambulatory Visit: Payer: Self-pay | Admitting: Internal Medicine

## 2017-06-10 ENCOUNTER — Ambulatory Visit (INDEPENDENT_AMBULATORY_CARE_PROVIDER_SITE_OTHER): Payer: Self-pay | Admitting: Internal Medicine

## 2017-06-10 ENCOUNTER — Encounter: Payer: Self-pay | Admitting: Internal Medicine

## 2017-06-10 ENCOUNTER — Ambulatory Visit: Payer: Self-pay | Admitting: Internal Medicine

## 2017-06-10 VITALS — BP 130/90 | HR 86 | Resp 12 | Ht 63.0 in | Wt 170.0 lb

## 2017-06-10 DIAGNOSIS — E1165 Type 2 diabetes mellitus with hyperglycemia: Secondary | ICD-10-CM

## 2017-06-10 DIAGNOSIS — M545 Low back pain, unspecified: Secondary | ICD-10-CM

## 2017-06-10 DIAGNOSIS — G8929 Other chronic pain: Secondary | ICD-10-CM

## 2017-06-10 LAB — GLUCOSE, POCT (MANUAL RESULT ENTRY): POC Glucose: 318 mg/dl — AB (ref 70–99)

## 2017-06-11 LAB — HGB A1C W/O EAG: HEMOGLOBIN A1C: 11.7 % — AB (ref 4.8–5.6)

## 2017-06-24 ENCOUNTER — Encounter: Payer: Self-pay | Admitting: Internal Medicine

## 2017-06-24 DIAGNOSIS — M545 Low back pain, unspecified: Secondary | ICD-10-CM

## 2017-06-24 HISTORY — DX: Low back pain, unspecified: M54.50

## 2017-06-24 NOTE — Progress Notes (Addendum)
   Subjective:    Patient ID: Corey Graham, male    DOB: 1976-02-04, 41 y.o.   MRN: [id number]  HPI    This is a note transcribed from written record of Dr. Carolann Littler, covering physician.  Please see written record in Media tab:    Follow up for Diabetes:  Previous A1C = 12.9 Jan 9th, before treatment  Has been taking Metformin ER 500 mg twice daily , but forgot meds the past 4 days.  No side effects.  When he  takes it BS's  Are 150-190,  If not taking >280-300s. Saw eye doctor last winter was told he needed to see a different doc, but has not rec'd appt. Yet.    Low back pain for several years.  Works on Barista job.  No injury.  Low mid back pain--hurts after standing a long time.  No acute injry;noradiating pain, numbness or weakness  Current Meds  Medication Sig  . Blood Glucose Monitoring Suppl (AGAMATRIX PRESTO) w/Device KIT Check sugars twice daily before meals  . glucose blood (AGAMATRIX PRESTO TEST) test strip Check sugars twice daily before meals  . metFORMIN (GLUCOPHAGE-XR) 500 MG 24 hr tablet Take 1 tablet (500 mg total) by mouth daily with breakfast.    No Known Allergies  Review of Systems     Objective:   Physical Exam Skin:  No rash HEENT:  Nl Neck:  Nl Chest:  Lungs clear CV:  RRR without murmur Abdomen:  Soft, NT Extremities:  Nl senstaion, no lesions, no edema Back:  - SLR.  Mild tenderness L1/2 midline + right paralumbar area.  No visible/palpable abn Neuro-normal LE exam       Assessment & Plan:  1.  Diabetes- ?Doing better?  Tolerating metformin, no SE's.  Check Hgb A1C today. Continue Metformin ER 500 BID ( Can take 2 qd or 1 BID) F/u in 3 mos with Dr. Amil Amen Check with eye doctor--?referral Suspect poor compliance with meal Sugar in coffee Suggested:  Sub a  H.b'd egg for cookies Monitor:  Check glucose at home 3 x/week and record--bring notebook next visit. A1C returned at 11.7:  Would increased dose of  Metformin (onlyl on 500 BID without side effects  Defer to Dr. Amil Amen.  2.  Back PIn-try heating pad. Tylenol If worse and wants work note, PT

## 2017-09-09 ENCOUNTER — Ambulatory Visit (INDEPENDENT_AMBULATORY_CARE_PROVIDER_SITE_OTHER): Payer: Self-pay | Admitting: Internal Medicine

## 2017-09-09 ENCOUNTER — Encounter: Payer: Self-pay | Admitting: Internal Medicine

## 2017-09-09 VITALS — BP 150/100 | HR 70 | Resp 12 | Ht 63.0 in | Wt 164.0 lb

## 2017-09-09 DIAGNOSIS — K029 Dental caries, unspecified: Secondary | ICD-10-CM

## 2017-09-09 DIAGNOSIS — Z23 Encounter for immunization: Secondary | ICD-10-CM

## 2017-09-09 DIAGNOSIS — I10 Essential (primary) hypertension: Secondary | ICD-10-CM

## 2017-09-09 DIAGNOSIS — Z79899 Other long term (current) drug therapy: Secondary | ICD-10-CM

## 2017-09-09 DIAGNOSIS — E1165 Type 2 diabetes mellitus with hyperglycemia: Secondary | ICD-10-CM

## 2017-09-09 DIAGNOSIS — R030 Elevated blood-pressure reading, without diagnosis of hypertension: Secondary | ICD-10-CM

## 2017-09-09 DIAGNOSIS — E11319 Type 2 diabetes mellitus with unspecified diabetic retinopathy without macular edema: Secondary | ICD-10-CM

## 2017-09-09 DIAGNOSIS — H40003 Preglaucoma, unspecified, bilateral: Secondary | ICD-10-CM

## 2017-09-09 HISTORY — DX: Essential (primary) hypertension: I10

## 2017-09-09 LAB — GLUCOSE, POCT (MANUAL RESULT ENTRY): POC GLUCOSE: 235 mg/dL — AB (ref 70–99)

## 2017-09-09 MED ORDER — LISINOPRIL 5 MG PO TABS: 5.0000 mg | ORAL_TABLET | Freq: Every day | ORAL | 11 refills | Status: DC

## 2017-09-09 MED ORDER — METFORMIN HCL ER 500 MG PO TB24: ORAL_TABLET | ORAL | 11 refills | Status: DC

## 2017-09-09 NOTE — Patient Instructions (Signed)
Tome un vaso de agua antes de cada comida Tome un minimo de 6 a 8 vasos de agua diarios Coma tres veces al dia Coma una proteina y una grasa saludable con comida.  (huevos, pescado, pollo, pavo, y limite carnes rojas Coma 5 porciones diarias de legumbres.  Mezcle los colores Coma 2 porciones diarias de frutas con cascara cuando sea comestible Use platos pequeos Suelte su tenedor o cuchara despues de cada mordida hata que se mastique y se trague Come en la mesa con amigos o familiares por lo menos una vez al dia Apague la televisin y aparatos electrnicos durante la comida  Su objetivo debe ser perder una libra por semana  Free flu vaccine at flu vaccine clinics:  September 20 8:30 to 11:30 a.m. And September 29th [facility] 1-4 p.m.  Both at [facility] next door to clinic  

## 2017-09-09 NOTE — Addendum Note (Signed)
Addended by: Marcene Duos on: 09/09/2017 05:19 PM   Modules accepted: Orders

## 2017-09-09 NOTE — Progress Notes (Signed)
Corey Graham will call [facility] to have records faxed.  09/09/2017.

## 2017-09-09 NOTE — Progress Notes (Signed)
Patient dental referral set to process 1st of the month.  To be included in the 10 to be sent.

## 2017-09-09 NOTE — Progress Notes (Addendum)
Subjective:    Patient ID: Corey Graham, male    DOB: [birth year], 41 y.o.   MRN: [id number]  HPI   1.  DM:  Checking sugars only 1-2 times weekly.   States generally around 8:30 a.m. When he returns from work. He is missing medications frequently.  Has 4 tabs of Metformin ER in bottle dated 07/16/2017, which should have been finished at least 3 weeks ago. Not scheduling his day.  Not changing lifestyle or eating habits.  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 And to work by 2 a.m. Describes eating a honeybun and other processed foods from vending in the morning for a snack around 4 a.m. Gets home around 8:30 a.m. Watches TV for 1 hour  May eat later --a regular breakfast with fried egg, 5 corn tortillas, beans and rice with chicken.  To bed until about 1:30 p.m. And to work again at 2 p.m.   Home around 6-6:30 p.m.  Eventually eats dinner Bedtime usually 10:30 p.m. And gets up 3 hours later. 3 children overweight.  Wife states he will not listen to her about lifestyle changes. Not clear if he feels he does really not have a problem Checks feet nightly--no concerns Had optometry exam and new glasses. Was told he has some problems with eyes and needs retinal specialist. No influenza this season yet No pneumovax No Tdap in past 10 years. No FLP recently   2.  Elevated BP:  Many years ago, was told bp was high--16 years ago.   Never was on medication.    3.  Dental problems:  Has cavities and breaking teeth --molars on both sider  Current Meds  Medication Sig  . Blood Glucose Monitoring Suppl (AGAMATRIX PRESTO) w/Device KIT Check sugars twice daily before meals  . glucose blood (AGAMATRIX PRESTO TEST) test strip Check sugars twice daily before meals  . metFORMIN (GLUCOPHAGE-XR) 500 MG 24 hr tablet 2 tabs by mouth twice daily with meals  . [DISCONTINUED] metFORMIN (GLUCOPHAGE-XR) 500 MG 24 hr tablet Take  1 tablet (500 mg total) by mouth daily with breakfast.    No Known Allergies  Review of Systems     Objective:   Physical Exam NAD HEENT:  PERRL, EOMI,  TMs pearly gray, throat without injection.  Several molars broken or deeply caried. Neck:  Supple, No adenopathy, no thyromegaly Chest:  CTA CV:  RRR without murmur or rub. Radial and DP pulses normal and equal Abd:  S, NT, No HSM or mass, + BS LE:  No edema  Diabetic Foot Exam - Simple   Simple Foot Form Diabetic Foot exam was performed with the following findings:  Yes 09/09/2017 10:07 AM  Visual Inspection No deformities, no ulcerations, no other skin breakdown bilaterally:  Yes Sensation Testing Intact to touch and monofilament testing bilaterally:  Yes Pulse Check Posterior Tibialis and Dorsalis pulse intact bilaterally:  Yes Comments          Assessment & Plan:  1.  DM:  A1C., BMP, FLP:  Metformin ER 500 2 tabs 2 times daily.  To get his day scheduled better--wife to sit down with him today and get a regular schedule mapped out To involve children in healthier lifestyle. Pneumovax Tdap To return for free flu vaccine next Saturday at [facility]. Referral to Ophthalmologist for glaucoma suspect when saw Optometry F/U with me in 1 month for #1 and 2  2.  Elevated  BP:  Urine microalbumin/crea and Lisinopril 5 mg daily. Return in 1 week for BP and BMP check  3.  Dental decay:  Dental clinic referral.  Discussed flossing nightly and brushing teeth afterward and in the morning as well when returns from morning work hours and after breakfast.

## 2017-09-10 LAB — MICROALBUMIN / CREATININE URINE RATIO
Creatinine, Urine: 79.8 mg/dL
Microalb/Creat Ratio: 691.6 mg/g creat — ABNORMAL HIGH (ref 0.0–30.0)
Microalbumin, Urine: 551.9 ug/mL

## 2017-09-10 LAB — BASIC METABOLIC PANEL
BUN / CREAT RATIO: 21 — AB (ref 9–20)
BUN: 13 mg/dL (ref 6–24)
CHLORIDE: 99 mmol/L (ref 96–106)
CO2: 22 mmol/L (ref 20–29)
Calcium: 9.3 mg/dL (ref 8.7–10.2)
Creatinine, Ser: 0.62 mg/dL — ABNORMAL LOW (ref 0.76–1.27)
GFR, EST AFRICAN AMERICAN: 143 mL/min/{1.73_m2} (ref >59)
GFR, EST NON AFRICAN AMERICAN: 124 mL/min/{1.73_m2} (ref >59)
Glucose: 240 mg/dL — ABNORMAL HIGH (ref 65–99)
POTASSIUM: 4.5 mmol/L (ref 3.5–5.2)
SODIUM: 137 mmol/L (ref 134–144)

## 2017-09-10 LAB — LIPID PANEL W/O CHOL/HDL RATIO
CHOLESTEROL TOTAL: 233 mg/dL — AB (ref 100–199)
HDL: 50 mg/dL (ref >39)
LDL CALC: 152 mg/dL — AB (ref 0–99)
TRIGLYCERIDES: 154 mg/dL — AB (ref 0–149)
VLDL Cholesterol Cal: 31 mg/dL (ref 5–40)

## 2017-09-10 LAB — HGB A1C W/O EAG: Hgb A1c MFr Bld: 10.9 % — ABNORMAL HIGH (ref 4.8–5.6)

## 2017-09-10 NOTE — Progress Notes (Signed)
This referral entered in error - new referral ordered.

## 2017-09-10 NOTE — Progress Notes (Signed)
Dennie Bible will process this referral to go out first of the month.  Referral printed.

## 2017-09-16 ENCOUNTER — Ambulatory Visit (INDEPENDENT_AMBULATORY_CARE_PROVIDER_SITE_OTHER): Payer: Self-pay

## 2017-09-16 VITALS — BP 128/82 | HR 74

## 2017-09-16 DIAGNOSIS — R030 Elevated blood-pressure reading, without diagnosis of hypertension: Secondary | ICD-10-CM

## 2017-09-16 DIAGNOSIS — Z79899 Other long term (current) drug therapy: Secondary | ICD-10-CM

## 2017-09-17 LAB — BASIC METABOLIC PANEL
BUN/Creatinine Ratio: 14 (ref 9–20)
BUN: 10 mg/dL (ref 6–24)
CALCIUM: 9.5 mg/dL (ref 8.7–10.2)
CO2: 28 mmol/L (ref 20–29)
Chloride: 97 mmol/L (ref 96–106)
Creatinine, Ser: 0.74 mg/dL — ABNORMAL LOW (ref 0.76–1.27)
GFR calc Af Amer: 133 mL/min/{1.73_m2} (ref >59)
GFR calc non Af Amer: 115 mL/min/{1.73_m2} (ref >59)
GLUCOSE: 167 mg/dL — AB (ref 65–99)
Potassium: 4.3 mmol/L (ref 3.5–5.2)
Sodium: 137 mmol/L (ref 134–144)

## 2017-09-17 NOTE — Progress Notes (Signed)
Patient instructed to continue current therapy and will let him know if Dr. Delrae Alfred decides to change his treatment

## 2017-10-09 ENCOUNTER — Ambulatory Visit: Payer: Self-pay | Admitting: Internal Medicine

## 2017-10-14 ENCOUNTER — Ambulatory Visit: Payer: Self-pay | Admitting: Internal Medicine

## 2017-10-14 IMAGING — DX DG CHEST 2V
2 series · 2 of 2 positions shown · non-contrast
Comparison: None.

CLINICAL DATA: Cough for 2 weeks

EXAM:
CHEST  2 VIEW

[chest pa]
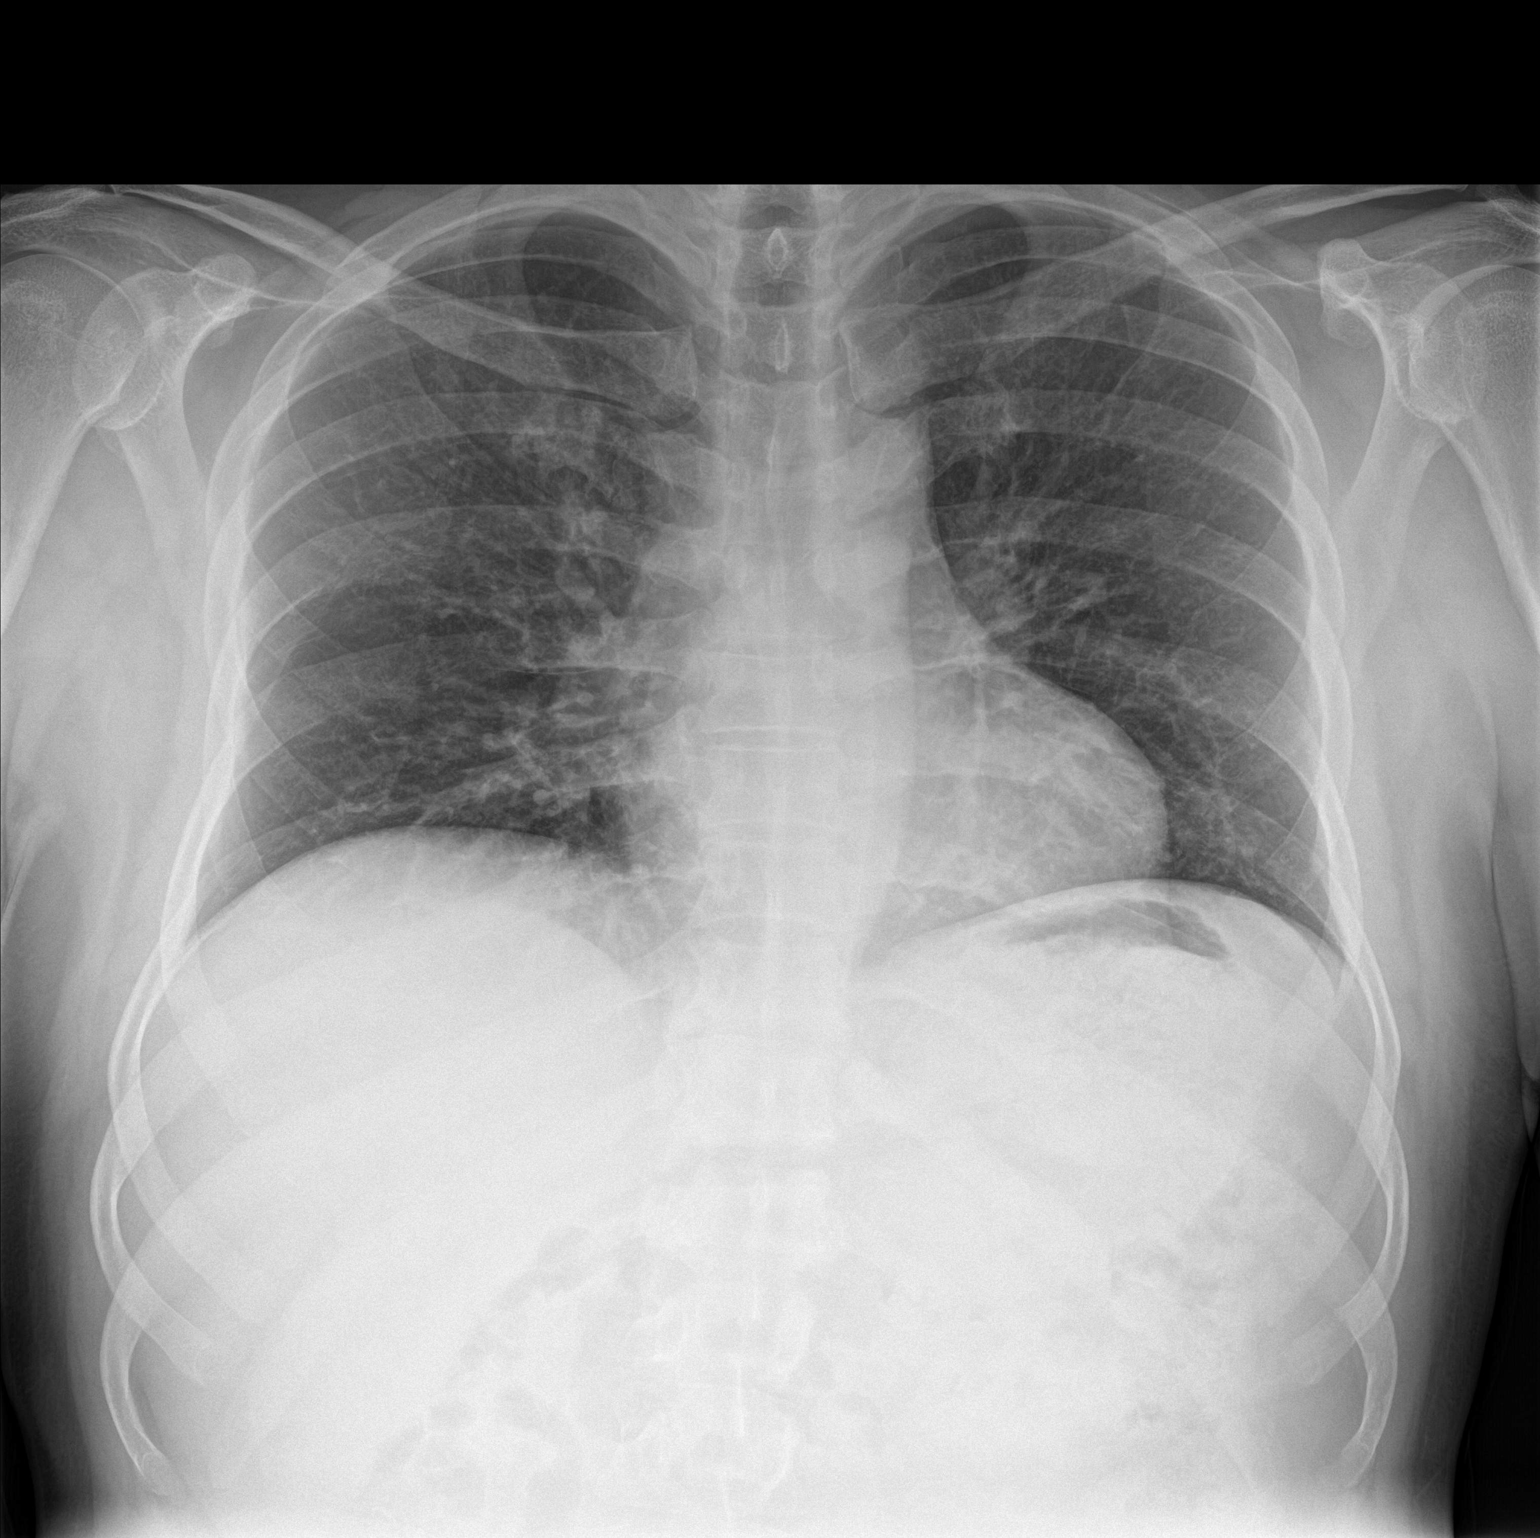

[chest lat]
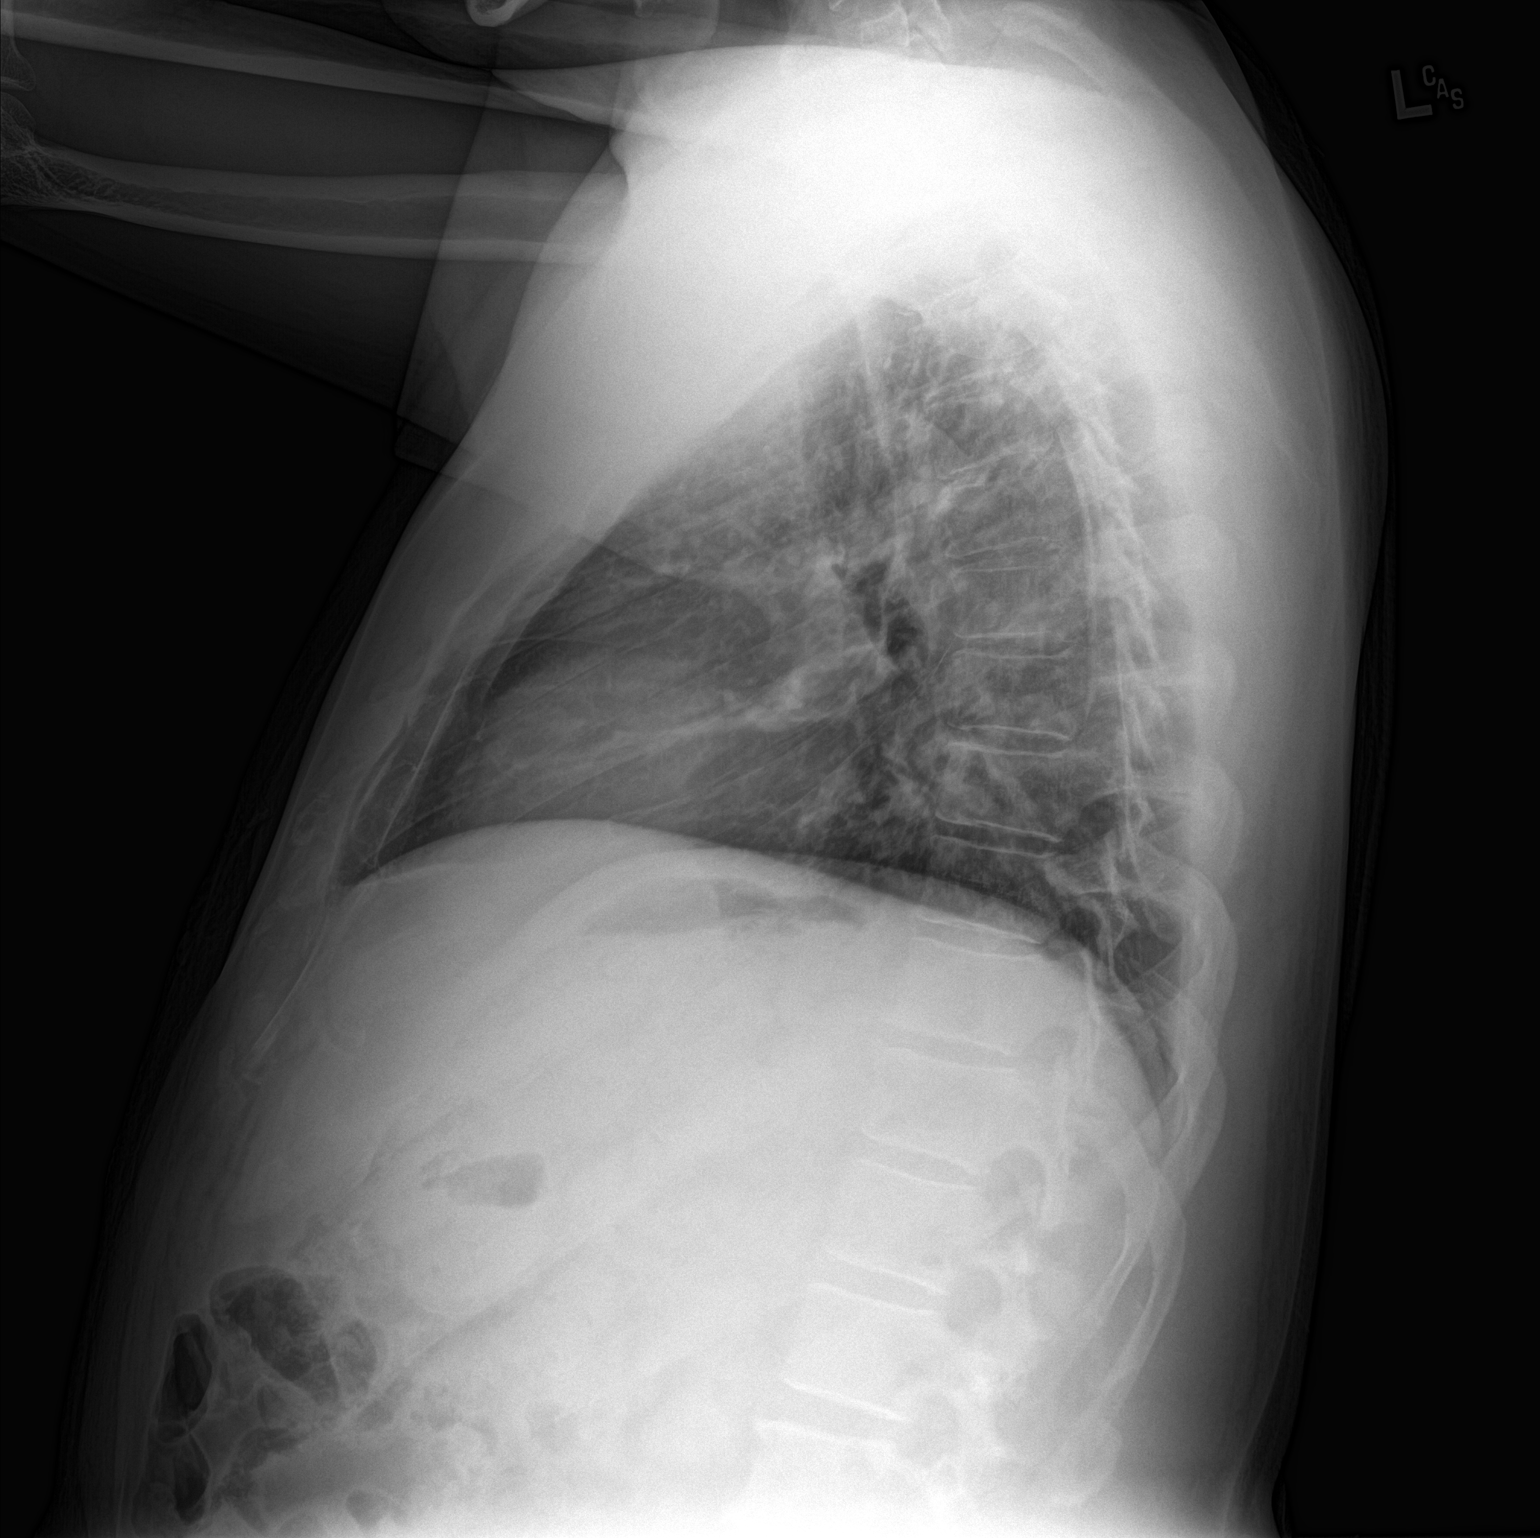

[2 of 2 positions shown; findings below may reference images not displayed]

FINDINGS: There is central interstitial prominence bilaterally. There is no
edema or consolidation. Heart size and pulmonary vascularity are
normal. No adenopathy. No bone lesions.
IMPRESSION: Findings consistent with central bronchiolitis. This appearance may
be seen with viral type pneumonitis. No edema or consolidation. No
volume loss.

## 2017-11-05 ENCOUNTER — Ambulatory Visit: Payer: Self-pay | Admitting: Internal Medicine

## 2017-11-05 ENCOUNTER — Encounter: Payer: Self-pay | Admitting: Internal Medicine

## 2017-11-05 VITALS — BP 142/102 | HR 76 | Resp 12 | Ht 63.0 in | Wt 166.0 lb

## 2017-11-05 DIAGNOSIS — R519 Headache, unspecified: Secondary | ICD-10-CM

## 2017-11-05 DIAGNOSIS — E1165 Type 2 diabetes mellitus with hyperglycemia: Secondary | ICD-10-CM

## 2017-11-05 DIAGNOSIS — R51 Headache: Secondary | ICD-10-CM

## 2017-11-05 DIAGNOSIS — Z23 Encounter for immunization: Secondary | ICD-10-CM

## 2017-11-05 DIAGNOSIS — I10 Essential (primary) hypertension: Secondary | ICD-10-CM

## 2017-11-05 MED ORDER — LISINOPRIL 5 MG PO TABS: ORAL_TABLET | ORAL | 11 refills | Status: DC

## 2017-11-05 NOTE — Progress Notes (Signed)
Subjective:    Patient ID: Corey Graham, male    DOB: January 12, 1976, 41 y.o.   MRN: [id number]  HPI   Has missed 2 appointments--missed the first and cancelled the 2nd.  1.  DM:  Has a few sugars in upper 100s and low 200s, but in general running in low to mid 100s.   The higher sugars were days when they were late to fill his meds.  2.  Essential Hypertension:  Stopped taking Lisinopril.  States he felt he was having headaches and not feeling well.  Only took for 1 week. States entire head hurt about 2 hours after taking the medication.  The headache would last 2 hours.  He denies having nausea, photophobia.  Unable to describe the character of the pain.  Sort of describes a sense of vertigo with the medication.  Just did not feel well in general taking the medication. He is not sure why he did not share this info with Cherice when he came in 1 week after starting the medication.   3.  Headache: Started last night.  Has never had a headache like this before.  States the pain came on suddenly in his right frontal head and around the right eye when he was standing going out to the car and turned his head to the right.  No pain in neck.   Describes the pain as sudden sharp pain shooting out from his head toward the right frontal and periorbital areas.   No discomfort in between the shooting pain.  He does state his right eye feels swollen.   Vision in both eyes fine. States he was able to sleep last night.  The pain did not keep him awake.   He cannot recall that he did anything yesterday where he might have over used neck muscles.   He is not under undo stress. No numbness, tingling or weakness in hands or feet.  4.  Right upper molar with cavity, but no pain.  Does not feel this is cause of headache pain.  Has appt with dentist in December.  Current Meds  Medication Sig  . Blood Glucose Monitoring Suppl (AGAMATRIX PRESTO) w/Device KIT Check sugars twice daily before meals  .  glucose blood (AGAMATRIX PRESTO TEST) test strip Check sugars twice daily before meals  . metFORMIN (GLUCOPHAGE-XR) 500 MG 24 hr tablet 2 tabs by mouth twice daily with meals    No Known Allergies   Review of Systems     Objective:   Physical Exam Intermittently pressing on right forehead.  HEENT:  PERRL, EOMI, discs sharp, Eyes without obvious swelling, redness or watering. TMs pearly gray, throat without injection.  Caries in upper molars.  Periodontal disease with gum recession. Neck:  Supple, No adenopathy.  Traps to nuchal ridge tight, but patient denies tenderness. Chest:  CTA CV:  RRR with normal S1 and S2, No S3, S4 or murmur.  Radial pulses normal and equal Abd:  S, NT, No HSM or mass, + BS Neuro: A & O x 3, CN II-XII grossly intact, DTRs 2+/4, Motor 5/5 throughout.  Sensory grossly normal. Gait normal        Assessment & Plan:  1.  DM: improved control based on blood sugar journal.  Discussed need to pick up medications 5 days before runs out so never runs out of meds.  2.  Essential Hypertension:  Possibly factor in headache.  Perhaps BP dropped to quickly with low dose Lisinopril.  To start  2.5 mg daily and notify clinic immediately if does not feel well on medication.   Consider switch to ARB if so.  3.  Headache:  Suspect tension related.  Ibuprofen 200 mg 2-4 tabs with food as needed.  If develops severe headache or with neurologic symptoms, to go to ED.  4.  HM:  Influenza vaccine.    5.  Hyperlipidemia:  Recheck FLP with A1C  Follow up end of December for bp and pulse as well as A1C

## 2017-12-03 ENCOUNTER — Other Ambulatory Visit: Payer: Self-pay

## 2017-12-03 VITALS — BP 122/82 | HR 74

## 2017-12-03 DIAGNOSIS — E1165 Type 2 diabetes mellitus with hyperglycemia: Secondary | ICD-10-CM

## 2017-12-03 DIAGNOSIS — I10 Essential (primary) hypertension: Secondary | ICD-10-CM

## 2017-12-03 DIAGNOSIS — Z13220 Encounter for screening for lipoid disorders: Secondary | ICD-10-CM

## 2017-12-04 ENCOUNTER — Other Ambulatory Visit: Payer: Self-pay | Admitting: Internal Medicine

## 2017-12-04 LAB — LIPID PANEL W/O CHOL/HDL RATIO
Cholesterol, Total: 217 mg/dL — ABNORMAL HIGH (ref 100–199)
HDL: 51 mg/dL (ref >39)
LDL Calculated: 134 mg/dL — ABNORMAL HIGH (ref 0–99)
TRIGLYCERIDES: 158 mg/dL — AB (ref 0–149)
VLDL CHOLESTEROL CAL: 32 mg/dL (ref 5–40)

## 2017-12-04 LAB — HGB A1C W/O EAG: Hgb A1c MFr Bld: 7.9 % — ABNORMAL HIGH (ref 4.8–5.6)

## 2017-12-04 MED ORDER — GLIPIZIDE 5 MG PO TABS: ORAL_TABLET | ORAL | 11 refills | Status: DC

## 2017-12-04 NOTE — Progress Notes (Signed)
Adding low dose glipizide to get glucose down just a bit more.

## 2018-01-31 ENCOUNTER — Telehealth: Payer: Self-pay | Admitting: Internal Medicine

## 2018-01-31 MED ORDER — METFORMIN HCL ER 500 MG PO TB24: ORAL_TABLET | ORAL | 11 refills | Status: DC

## 2018-01-31 NOTE — Telephone Encounter (Signed)
[facility]. Nicky please notify patient

## 2018-01-31 NOTE — Telephone Encounter (Signed)
Patient called stating will like to have Metformin to be [facility].  Cherice please advise.

## 2018-01-31 NOTE — Telephone Encounter (Signed)
Nicky left detail VM to patient.

## 2018-02-11 ENCOUNTER — Ambulatory Visit: Payer: Self-pay | Admitting: Internal Medicine

## 2018-04-11 ENCOUNTER — Ambulatory Visit: Payer: Self-pay | Admitting: Internal Medicine

## 2018-04-11 ENCOUNTER — Encounter: Payer: Self-pay | Admitting: Internal Medicine

## 2018-04-11 VITALS — BP 138/92 | HR 84 | Resp 12 | Ht 63.0 in | Wt 172.0 lb

## 2018-04-11 DIAGNOSIS — G4700 Insomnia, unspecified: Secondary | ICD-10-CM

## 2018-04-11 DIAGNOSIS — E1165 Type 2 diabetes mellitus with hyperglycemia: Secondary | ICD-10-CM

## 2018-04-11 DIAGNOSIS — I10 Essential (primary) hypertension: Secondary | ICD-10-CM

## 2018-04-11 LAB — GLUCOSE, POCT (MANUAL RESULT ENTRY): POC GLUCOSE: 166 mg/dL — AB (ref 70–99)

## 2018-04-11 NOTE — Patient Instructions (Addendum)
Corey GammonCarlos Graham Zafn--read his books  Warm bath and reading before bed. If unable to get to sleep in 20 minutes, get out of bed, go somewhere calm and read until you are sleepy, then back to bed. If awaken and unable to get back to sleep in 20 minutes--read as above until sleepy. Physical activity with kids every day--gradually work up to 1 hour.  Do not exercise close to bedtime. No napping Try to get sleep from 9 a.m. To 1 p.m. And 9 p.m. To 1 a.m. Daily.

## 2018-04-11 NOTE — Progress Notes (Signed)
   Subjective:    Patient ID: Corey Graham, male    DOB: 06/20/1976, 42 y.o.   MRN: [id number]  HPI   1.  DM:  Not checking sugars.  States he forgets.  Last checked 2 weeks ago and was 142 fasting in the morning. Forgetting the Metformin once daily, sometimes in the morning and sometimes in the evening.  Takes the half dose of Glipizide the other time of day.  Basically, taking only one with each meal, but not both.    2.  Essential Hypertension/microlbumuria:  Taking lisinopril 2.5 mg daily.  He denies missing his Lisinopril at all.    3.  Hyperlipidemia:  Was not at goal in February, but did not follow up after labs.  Discussed may need to start statin, but needs to get his other medications set first.     4.  Insomnia:  For at least 2 years.  Can only sleep about 3 hours.  Works from 2 a.m. To 8 a.m.  Returns to work 2 p.m. Until 6 or 7 p.m. Milks cows on a dairy farm. Tries to sleep starting at 10 a.m., but unable to fall asleep, so gets up and watches TV or walks.   Little noises wake him up.  Children x 3 are 16, 13, 10.   They are not there when he returns in the morning. He spends time with kids in the evening when he returns from job.  They do not do anything physically active--watch TV or eat dinner together.  One child has weight concerns. At night, in bed by 9 to 11 p.m., but still with problems sleeping. Not outside much with his work nor with free time.  Current Meds  Medication Sig  . glipiZIDE (GLUCOTROL) 5 MG tablet 1/2 tab by mouth twice daily with meals  . lisinopril (PRINIVIL,ZESTRIL) 5 MG tablet 1/2 tab by mouth daily  . metFORMIN (GLUCOPHAGE-XR) 500 MG 24 hr tablet 2 tabs by mouth twice daily with meals    No Known Allergies  Review of Systems     Objective:   Physical Exam  NAD Lungs:  CTA CV:  RRR without murmur or rub, radial pulses normal and equal LE:  No edema      Assessment & Plan:  1.  DM:  Did not feel meds most of 2018 when  called pharmacy.  Last metformin fill was from July of 2018 until just a month ago.  [facility],   2.  Essential Hypertension:  Not filling medication regularly as above  3.  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   Otherwise sleep recommendations given in patient instructions.  4.  Left shoulder: return for acute visit.  Brought this up at end of visit.

## 2018-04-25 ENCOUNTER — Ambulatory Visit: Payer: Self-pay | Admitting: Internal Medicine

## 2018-05-01 ENCOUNTER — Ambulatory Visit: Payer: Self-pay | Admitting: Internal Medicine

## 2018-07-14 ENCOUNTER — Ambulatory Visit: Payer: Self-pay | Admitting: Internal Medicine

## 2018-07-14 ENCOUNTER — Encounter: Payer: Self-pay | Admitting: Internal Medicine

## 2018-07-14 VITALS — BP 138/80 | HR 76 | Resp 12 | Ht 63.0 in | Wt 175.0 lb

## 2018-07-14 DIAGNOSIS — R1010 Upper abdominal pain, unspecified: Secondary | ICD-10-CM

## 2018-07-14 DIAGNOSIS — M25512 Pain in left shoulder: Secondary | ICD-10-CM

## 2018-07-14 DIAGNOSIS — M75102 Unspecified rotator cuff tear or rupture of left shoulder, not specified as traumatic: Secondary | ICD-10-CM

## 2018-07-14 DIAGNOSIS — G8929 Other chronic pain: Secondary | ICD-10-CM

## 2018-07-14 DIAGNOSIS — E1165 Type 2 diabetes mellitus with hyperglycemia: Secondary | ICD-10-CM

## 2018-07-14 LAB — GLUCOSE, POCT (MANUAL RESULT ENTRY): POC GLUCOSE: 284 mg/dL — AB (ref 70–99)

## 2018-07-14 NOTE — Progress Notes (Signed)
CC: Shoulder pain Abdominal pain  HPI: [name] is a 42 yo M with HTN and DM who presents with 6+ months of intermittent left shoulder pain and stomach pain.    With respect to shoulder pain, this is an intermittent pain, severe in intensity, worse with activity, especially reaching out to lift something in front of him, or with abduction of the arm.  There is no associated paresthesias, swelling, skin changes.  There is no trauma, injury, car accident, fall.  He has had no previous problems with this shoulder.  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  He has not tried any medications to help it.  With regard to the abdominal pain, this is been present for the last 4 days, is also intermittent, not provoked by eating, movement, activity, exertion.  It is a sharp pain, radiating from the groin up to the epigastrium, lasts 30 seconds to minute, resolve spontaneously.    ROS: No fever, vomiting, diarrhea, weight loss, abdominal distention.  No paresthesias in the arm, no arm weakness.  He will    PMH:  1. Hypertension 2. Diabetes   Medications:  Prior to Admission medications   Medication Sig Start Date End Date Taking? Authorizing Provider  glipiZIDE (GLUCOTROL) 5 MG tablet 1/2 tab by mouth twice daily with meals 12/04/17  Yes Mack Hook, MD  lisinopril (PRINIVIL,ZESTRIL) 5 MG tablet 1/2 tab by mouth daily Patient taking differently: 5 mg daily.  11/05/17  Yes Mack Hook, MD  metFORMIN (GLUCOPHAGE-XR) 500 MG 24 hr tablet 2 tabs by mouth twice daily with meals 01/31/18  Yes Mack Hook, MD  Blood Glucose Monitoring Suppl (AGAMATRIX PRESTO) w/Device KIT Check sugars twice daily before meals Patient not taking: Reported on 04/11/2018 12/25/16   Mack Hook, MD  glucose blood (AGAMATRIX PRESTO TEST) test strip Check sugars twice daily before meals Patient not taking: Reported on 04/11/2018 12/25/16   Mack Hook, MD       SH: Works in a dairy. Nonsmoker      OBJECTIVE: BP 138/80 (BP Location: Left Arm, Patient Position: Sitting, Cuff Size: Normal)   Pulse 76   Resp 12   Ht '5\' 3"'$  (1.6 m)   Wt 175 lb (79.4 kg)   BMI 31.00 kg/m  GEN: Appears well, no acute distress, well nourished Abdomen: No tenderness to palpation, no distension, no scars. No HSM   COR: Normal S1, S2, RRR< no murmurs, no LE edema RESP: Normal respiratory rate and rhythm, lungs clear without wheezes or rales. MSK: There is no deformity of the left shoulder.  There is pain with palpation over the biceps tendon insertion, and over the rotator cuff, and over the [facility] joint.  There is no swelling over the [facility] joint.  There is normal active and passive range of motion of the right shoulder.  Left shoulder flexion is lacking only 20 degrees, same with extension.  With abduction, he lacks about 90 degrees, limited mostly by pain.  With passive range of motion, I can extend the left shoulder further.  Negative drop arm test.  No pain with internal or external rotation of the shoulder.  Actually no pain with resisted abduction.      A&P: 1.  Rotator cuff syndrome versus AC joint sprain: The history of pain with lifting things separated from his body suggest to me [facility] joint injury.  On exam, he is mostly having pain with abduction, which also  suggests rotator cuff pathology.  There is also a history of some locking of the shoulder, which suggests osteoarthritis. -Rest -Ibuprofen for 1 week, with omeprazole -Physical therapy referral has been made - X-ray, to eval for arthritis of the [facility] joint -Follow up pending results of x-ray, PT   2. Abdominal pain Intermittent, spontaneous, brief, sharp.  Suspect gas pain or constipation.  3. Diabetes Poorly controlled.  Glucose here is 284.  Patient reports medication noncompliance.    Corey Graham 07/14/2018, 12:50 PM

## 2018-10-06 ENCOUNTER — Other Ambulatory Visit: Payer: Self-pay | Admitting: Internal Medicine

## 2019-03-03 ENCOUNTER — Ambulatory Visit: Payer: Self-pay | Admitting: Internal Medicine

## 2019-03-03 ENCOUNTER — Other Ambulatory Visit: Payer: Self-pay

## 2019-03-03 ENCOUNTER — Encounter: Payer: Self-pay | Admitting: Internal Medicine

## 2019-03-03 VITALS — BP 142/98 | HR 70 | Resp 12 | Ht 63.0 in | Wt 167.0 lb

## 2019-03-03 DIAGNOSIS — H109 Unspecified conjunctivitis: Secondary | ICD-10-CM

## 2019-03-03 DIAGNOSIS — E1165 Type 2 diabetes mellitus with hyperglycemia: Secondary | ICD-10-CM

## 2019-03-03 DIAGNOSIS — I10 Essential (primary) hypertension: Secondary | ICD-10-CM

## 2019-03-03 DIAGNOSIS — H5461 Unqualified visual loss, right eye, normal vision left eye: Secondary | ICD-10-CM

## 2019-03-03 LAB — GLUCOSE, POCT (MANUAL RESULT ENTRY): POC GLUCOSE: 489 mg/dL — AB (ref 70–99)

## 2019-03-03 MED ORDER — AGAMATRIX ULTRA-THIN LANCETS MISC: 11 refills | Status: DC

## 2019-03-03 MED ORDER — LISINOPRIL 5 MG PO TABS: ORAL_TABLET | ORAL | 11 refills | Status: DC

## 2019-03-03 MED ORDER — METFORMIN HCL ER 500 MG PO TB24: ORAL_TABLET | ORAL | 11 refills | Status: DC

## 2019-03-03 MED ORDER — GLIPIZIDE 5 MG PO TABS: ORAL_TABLET | ORAL | 11 refills | Status: DC

## 2019-03-03 MED ORDER — OFLOXACIN 0.3 % OP SOLN: 1.0000 [drp] | Freq: Four times a day (QID) | OPHTHALMIC | 0 refills | Status: DC

## 2019-03-03 MED ORDER — GLUCOSE BLOOD VI STRP: ORAL_STRIP | 12 refills | Status: DC

## 2019-03-03 NOTE — Progress Notes (Signed)
    Subjective:    Patient ID: Corey Graham, male   DOB: [birth year], 43 y.o.   MRN: [id number]   HPI   interpreted  Two weeks ago, developed redness and pustular discharge in his right eye and the next day, developed the same thing in his left eye.  He apparently used chamolile tea he had brewed to put in his eyes.   His eyes are better with redness and discharge, but he is having a foreign body sensation in his right eye, lateral canthus area.   He is noting a "black" area in the upper part of his right visual field.  Sounds like he also is having floaters.  States these symptoms started the day after he developed the redness and discharge He is sometimes awakening with crusting of his eyes. His left eye otherwise seems normal.  2.  DM/HTN:  Has not been taking medications for 1 month.  Has not taken Glipizide for 6 months.  Was filled until 11/2018, however.   Has a very difficult job at a dairy.  Long fragmented hours and limited sleep. Employers are not supportive of time for medical care.   He is off 10-12 each morning and could come in.  No outpatient medications have been marked as taking for the 03/03/19 encounter (Appointment) with Julieanne Manson, MD.   No Known Allergies   Review of Systems    Objective:   There were no vitals taken for this visit.  Physical Exam   Constantly rubbing at right eye. HEENT:  PERRL, EOMI, + RR OU.  Discs sharp.  Unable to clearly see if any hemorrhages.   On fluorescence of right eye, no obvious abrasions/scratching.  No lesions over upper right cornea as well. Conjunctivae mildly injected bilaterally.  Mild crusting about eyelashes   Assessment & Plan   1.  Bilateral conjunctivitis with complaints of vision loss or changes in right eye:  Urgent referral to Ophthalmology.  Ocuflox eye drops 1 drop each eye 4 times daily for 5 days.  2.  DM:  Restart meds.  Long discussion regarding complications if does not follow up  regularly.   Discussed we will work with him to have appts during the 10-12 pm hour time, but he needs to recognize the importance of his care.  3.  Hypertension:  Restart medication. As above.  Spent 45 minutes face to face

## 2019-04-16 ENCOUNTER — Ambulatory Visit: Payer: Self-pay | Admitting: Internal Medicine

## 2019-05-12 NOTE — Progress Notes (Addendum)
[facility] Note  05/13/2019     CHIEF COMPLAINT Patient presents for Retina Evaluation   HISTORY OF PRESENT ILLNESS: Corey Graham is a 43 y.o. male who presents to the clinic today for:   HPI    Retina Evaluation    In right eye.  This started months ago.  Duration of months.  Associated Symptoms Floaters.  I, the attending physician,  performed the HPI with the patient and updated documentation appropriately.          Comments    Patient complains of shade covering superior part of his vision in his right eye.  Patient states this began about 3 months ago.  Patient is unable to see through shaded area.  Patient denies eye pain or discomfort.  Patient complains of floaters in his right eye only.  He denies flashes of light.  Unknown last BS/HgA1c or BP       Last edited by Bernarda Caffey, MD on 05/13/2019 10:02 AM. (History)    pt is with a family member who is interpreting for him today, pt states he saw Dr. Frederico Hamman last week, but he states his vision has been down for about 3 months, pt states he got an infection in his right eye and that's when he noticed the decreased vision, pt endorses being diabetic and having high blood pressure, pt states he does not check his blood sugar at home bc he forgets, last A1c (12.18.18) was 7.9, glucose on 03.17.20 was 489  Referring physician: Gevena Cotton, MD [address] [place], [place] [address]  HISTORICAL INFORMATION:   Selected notes from the MEDICAL RECORD NUMBER    CURRENT MEDICATIONS: Current Outpatient Medications (Ophthalmic Drugs)  Medication Sig  . ofloxacin (OCUFLOX) 0.3 % ophthalmic solution Place 1 drop into both eyes 4 (four) times daily.   No current facility-administered medications for this visit.  (Ophthalmic Drugs)   Current Outpatient Medications (Other)  Medication Sig  . AgaMatrix Ultra-Thin Lancets MISC Check blood glucose twice daily before meals   . Blood Glucose Monitoring Suppl (AGAMATRIX PRESTO) w/Device KIT Check sugars twice daily before meals (Patient not taking: Reported on 04/11/2018)  . glipiZIDE (GLUCOTROL) 5 MG tablet 1/2 tab by mouth twice daily with meals  . glucose blood (AGAMATRIX PRESTO TEST) test strip Check blood glucose twice daily before meals  . lisinopril (PRINIVIL,ZESTRIL) 5 MG tablet 1/2 tab by mouth daily  . metFORMIN (GLUCOPHAGE-XR) 500 MG 24 hr tablet 2 tabs by mouth twice daily with meals   No current facility-administered medications for this visit.  (Other)      REVIEW OF SYSTEMS: ROS    Positive for: Eyes   Negative for: Constitutional, Gastrointestinal, Neurological, Skin, Genitourinary, Musculoskeletal, HENT, Endocrine, Cardiovascular, Respiratory, Psychiatric, Allergic/Imm, Heme/Lymph   Last edited by Doneen Poisson on 05/13/2019  9:01 AM. (History)       ALLERGIES No Known Allergies  PAST MEDICAL HISTORY Past Medical History:  Diagnosis Date  . Diabetes mellitus without complication ([place]) 6811  . Essential hypertension 09/09/2017  . Low back pain 06/24/2017   History reviewed. No pertinent surgical history.  FAMILY HISTORY History reviewed. No pertinent family history.  SOCIAL HISTORY Social History   Tobacco Use  . Smoking status: Never Smoker  . Smokeless tobacco: Never Used  Substance Use Topics  . Alcohol use: Yes    Comment: infrequent  . Drug use: No         OPHTHALMIC  EXAM:  Base Eye Exam    Visual Acuity (Snellen - Linear)      Right Left   Dist [place] 20/400 20/20   Dist ph [place] NI        Tonometry (Tonopen, 9:10 AM)      Right Left   Pressure 21 24       Pupils      Dark Light Shape React APD   Right 4 3 Round Brisk 0   Left 4 3 Round Brisk 0       Visual Fields      Left Right    Full Full       Extraocular Movement      Right Left    Full Full       Neuro/Psych    Oriented x3:  Yes   Mood/Affect:  Normal       Dilation    Both eyes:   1.0% Mydriacyl, 2.5% Phenylephrine @ 9:10 AM        Slit Lamp and Fundus Exam    Slit Lamp Exam      Right Left   Lids/Lashes Normal midl Meibomian gland dysfunction   Conjunctiva/Sclera mild nasal Pinguecula mild nasal Pinguecula   Cornea 1+ Punctate epithelial erosions, mild Debris in tear film 1+ Punctate epithelial erosions, mild Debris in tear film   Anterior Chamber Deep and quiet Deep and quiet   Iris Round and dilated, No NVI Round and dilated, No NVI   Lens Clear Clear   Vitreous mild Vitreous syneresis mild Vitreous syneresis       Fundus Exam      Right Left   Disc focal edema inferiorly with +disc hemes Pink and Sharp, +cupping, inferior rim thinning   C/D Ratio 0.6 0.75   Macula Blunted foveal reflex, massive central edema, blot hemes greatest inferiorly along IT arcade, cluster of exudates superior macula Flat, scattered MA/CWS   Vessels Vascular attenuation, Tortuous, AV crossing changes, +CRVO with greater effect inferiorly Vascular attenuation, Tortuous, Copper wiring, AV crossing changes   Periphery Attached, 360 DBH greatest inferiorly Attached, scattered DBH/CWS greatest posteriorly about the disc, scattered peripheral MA/IRH        Refraction    Wearing Rx      Sphere   Right None   Left None       Manifest Refraction (Retinoscopy)      Sphere Cylinder Axis Dist VA   Right NI +/-3.00      Left [place] +0.25 030 20/20          IMAGING AND PROCEDURES  Imaging and Procedures for _0 @  OCT, Retina - OU - Both Eyes       Right Eye Quality was borderline. Central Foveal Thickness: 870. Progression has no prior data. Findings include abnormal foveal contour, subretinal fluid, intraretinal fluid, intraretinal hyper-reflective material.   Left Eye Quality was borderline. Central Foveal Thickness: 276. Findings include normal foveal contour, no SRF, no IRF (Trace ERM).   Notes *Images captured and stored on drive  Diagnosis / Impression:  OD:  CRVO with massive CME (+IRF/SRF) OS: NFP, no IRF/SRF   Clinical management:  See below  Abbreviations: NFP - Normal foveal profile. CME - cystoid macular edema. PED - pigment epithelial detachment. IRF - intraretinal fluid. SRF - subretinal fluid. EZ - ellipsoid zone. ERM - epiretinal membrane. ORA - outer retinal atrophy. ORT - outer retinal tubulation. SRHM - subretinal hyper-reflective material        Intravitreal Injection,  Pharmacologic Agent - OD - Right Eye       Time Out 05/13/2019. 11:31 AM. Confirmed correct patient, procedure, site, and patient consented.   Anesthesia Topical anesthesia was used. Anesthetic medications included Lidocaine 2%, Proparacaine 0.5%.   Procedure Preparation included 5% betadine to ocular surface, eyelid speculum. A 30 gauge needle was used.   Injection:  1.25 mg Bevacizumab (AVASTIN) SOLN   NDC: 30076-226-33, Lot: [id number] , Expiration date: 05/29/2019   Route: Intravitreal, Site: Right Eye, Waste: 0 mL  Post-op Post injection exam found visual acuity of at least counting fingers. The patient tolerated the procedure well. There were no complications. The patient received written and verbal post procedure care education.                 ASSESSMENT/PLAN:    ICD-10-CM   1. Central retinal vein occlusion with macular edema of right eye H34.8110 Intravitreal Injection, Pharmacologic Agent - OD - Right Eye    Bevacizumab (AVASTIN) SOLN 1.25 mg  2. Retinal edema H35.81 OCT, Retina - OU - Both Eyes  3. Moderate nonproliferative diabetic retinopathy of both eyes with macular edema associated with type 2 diabetes mellitus ([place]) E32.1224   4. Essential hypertension I10   5. Hypertensive retinopathy of both eyes H35.033   6. 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 CRVO w/ CME, OD  - The natural history of retinal vein occlusion and macular edema and treatment options including observation, laser photocoagulation, and  intravitreal antiVEGF injection with Avastin and Lucentis and Eylea and intravitreal injection of steroids with triamcinolone and Ozurdex and the complications of these procedures including loss of vision, infection, cataract, glaucoma, and retinal detachment were discussed with patient.  - Specifically discussed findings from [place] / [place] study regarding patient stabilization with anti-VEGF agents and increased potential for visual improvements.  Also discussed need for frequent follow up and potentially multiple injections given the chronic nature of the disease process  - pt reports symptoms for ~3 mos  - exam with 360 hemes, but inferior greater than superior; +inferior disc edema w/ hemes  - OCT shows massive central CME OD  - recommend IVA OD #1 today, 05.27.20  - pt wishes to proceed  - RBA of procedure discussed, questions answered  - informed consent obtained and signed  - see procedure note  - pt interested in referral to [facility] for [facility] of care  - will make referral w/ recommendation of f/u in ~4 weeks  3. Moderate nonproliferative diabetic retinopathy, both eyes - The incidence, risk factors for progression, natural history and treatment options for diabetic retinopathy were discussed with patient.   - The need for close monitoring of blood glucose, blood pressure, and serum lipids, avoiding cigarette or any type of tobacco, and the need for long term follow up was also discussed with patient. - exam scattered Why OU -- OD complicated by CRVO as above - OS without DME - monitor  4,5. Hypertensive retinopathy OU  - discussed importance of tight BP control  - monitor  6. Ocular hypertension, Glaucoma suspect  - IOP today, OD: 21, OS: 24  - +cupping OS   Ophthalmic Meds Ordered this visit:  Meds ordered this encounter  Medications  . Bevacizumab (AVASTIN) SOLN 1.25 mg       Return in about 4 weeks (around 06/10/2019) for f/u CRVO OD,  Dilated Exam, OCT.  There are no Patient Instructions on file for this visit.   Explained  the diagnoses, plan, and follow up with the patient and they expressed understanding.  Patient expressed understanding of the importance of proper follow up care.   Gardiner Sleeper, M.D., Ph.D. Diseases & Surgery of the Retina and Vitreous [facility]  I have reviewed the above documentation for accuracy and completeness, and I agree with the above. Gardiner Sleeper, M.D., Ph.D. 05/13/19 1:21 PM      Abbreviations: M myopia (nearsighted); A astigmatism; H hyperopia (farsighted); P presbyopia; Mrx spectacle prescription;  CTL contact lenses; OD right eye; OS left eye; OU both eyes  XT exotropia; ET esotropia; PEK punctate epithelial keratitis; PEE punctate epithelial erosions; DES dry eye syndrome; MGD meibomian gland dysfunction; ATs artificial tears; PFAT's preservative free artificial tears; [place] nuclear sclerotic cataract; PSC posterior subcapsular cataract; ERM epi-retinal membrane; PVD posterior vitreous detachment; RD retinal detachment; DM diabetes mellitus; DR diabetic retinopathy; NPDR non-proliferative diabetic retinopathy; PDR proliferative diabetic retinopathy; CSME clinically significant macular edema; DME diabetic macular edema; dbh dot blot hemorrhages; CWS cotton wool spot; POAG primary open angle glaucoma; C/D cup-to-disc ratio; HVF humphrey visual field; GVF goldmann visual field; OCT optical coherence tomography; IOP intraocular pressure; BRVO Branch retinal vein occlusion; CRVO central retinal vein occlusion; CRAO central retinal artery occlusion; BRAO branch retinal artery occlusion; RT retinal tear; SB scleral buckle; PPV pars plana vitrectomy; VH Vitreous hemorrhage; PRP panretinal laser photocoagulation; IVK intravitreal kenalog; VMT vitreomacular traction; MH Macular hole;  NVD neovascularization of the disc; NVE neovascularization elsewhere; AREDS age related eye  disease study; ARMD age related macular degeneration; POAG primary open angle glaucoma; EBMD epithelial/anterior basement membrane dystrophy; ACIOL anterior chamber intraocular lens; IOL intraocular lens; PCIOL posterior chamber intraocular lens; Phaco/IOL phacoemulsification with intraocular lens placement; [place] photorefractive keratectomy; LASIK laser assisted in situ keratomileusis; HTN hypertension; DM diabetes mellitus; COPD chronic obstructive pulmonary disease

## 2019-05-13 ENCOUNTER — Other Ambulatory Visit: Payer: Self-pay

## 2019-05-13 ENCOUNTER — Encounter (INDEPENDENT_AMBULATORY_CARE_PROVIDER_SITE_OTHER): Payer: Self-pay | Admitting: Ophthalmology

## 2019-05-13 ENCOUNTER — Ambulatory Visit (INDEPENDENT_AMBULATORY_CARE_PROVIDER_SITE_OTHER): Payer: Self-pay | Admitting: Ophthalmology

## 2019-05-13 DIAGNOSIS — I10 Essential (primary) hypertension: Secondary | ICD-10-CM

## 2019-05-13 DIAGNOSIS — H3581 Retinal edema: Secondary | ICD-10-CM

## 2019-05-13 DIAGNOSIS — H35033 Hypertensive retinopathy, bilateral: Secondary | ICD-10-CM

## 2019-05-13 DIAGNOSIS — E113313 Type 2 diabetes mellitus with moderate nonproliferative diabetic retinopathy with macular edema, bilateral: Secondary | ICD-10-CM

## 2019-05-13 DIAGNOSIS — H40053 Ocular hypertension, bilateral: Secondary | ICD-10-CM

## 2019-05-13 DIAGNOSIS — H34811 Central retinal vein occlusion, right eye, with macular edema: Secondary | ICD-10-CM

## 2019-05-13 MED ORDER — BEVACIZUMAB CHEMO INJECTION 1.25MG/0.05ML SYRINGE FOR KALEIDOSCOPE
1.2500 mg | INTRAVITREAL | Status: AC | PRN
  Administered 2019-05-13: 1.25 mg via INTRAVITREAL

## 2019-06-11 DIAGNOSIS — H3581 Retinal edema: Secondary | ICD-10-CM | POA: Insufficient documentation

## 2019-06-11 DIAGNOSIS — H34831 Tributary (branch) retinal vein occlusion, right eye, with macular edema: Secondary | ICD-10-CM | POA: Insufficient documentation

## 2019-06-11 DIAGNOSIS — E113413 Type 2 diabetes mellitus with severe nonproliferative diabetic retinopathy with macular edema, bilateral: Secondary | ICD-10-CM | POA: Insufficient documentation

## 2019-06-11 DIAGNOSIS — H2513 Age-related nuclear cataract, bilateral: Secondary | ICD-10-CM | POA: Insufficient documentation

## 2020-02-22 ENCOUNTER — Other Ambulatory Visit: Payer: Self-pay

## 2020-02-22 ENCOUNTER — Ambulatory Visit (HOSPITAL_COMMUNITY)
Admission: EM | Admit: 2020-02-22 | Discharge: 2020-02-22 | Disposition: A | Discharge status: Home / Self Care | Payer: Self-pay | Attending: Urgent Care | Admitting: Urgent Care

## 2020-02-22 ENCOUNTER — Encounter (HOSPITAL_COMMUNITY): Payer: Self-pay | Admitting: Emergency Medicine

## 2020-02-22 DIAGNOSIS — L02419 Cutaneous abscess of limb, unspecified: Secondary | ICD-10-CM

## 2020-02-22 DIAGNOSIS — L03119 Cellulitis of unspecified part of limb: Secondary | ICD-10-CM

## 2020-02-22 DIAGNOSIS — E1165 Type 2 diabetes mellitus with hyperglycemia: Secondary | ICD-10-CM

## 2020-02-22 DIAGNOSIS — M79605 Pain in left leg: Secondary | ICD-10-CM

## 2020-02-22 MED ORDER — DOXYCYCLINE HYCLATE 100 MG PO CAPS: 100.0000 mg | ORAL_CAPSULE | Freq: Two times a day (BID) | ORAL | 0 refills | Status: DC

## 2020-02-22 MED ORDER — NAPROXEN 500 MG PO TABS: 500.0000 mg | ORAL_TABLET | Freq: Two times a day (BID) | ORAL | 0 refills | Status: DC

## 2020-02-22 NOTE — Discharge Instructions (Addendum)
Para su diabetes, asegrese de Immunologist ricos en almidn y carbohidratos como la pasta, el pan, los pasteles, el arroz, las patatas y [place]. Estos alimentos pueden elevar su nivel de Production assistant, radio. Adems, limite su consumo de alcohol a 1 por da, evite los refrescos, los ts dulces. Para la presin arterial elevada, asegrese de controlar la sal en su dieta. No coma alimentos de restaurantes y limite los alimentos procesados en casa. Los alimentos procesados incluyen cosas como comidas congeladas, carnes y cenas de [place]. Asegrese de que su dolor preste atencin a las etiquetas de sodio en los alimentos que compra en el supermercado. Para condimentar, puede usar [employer] Mrs. Dash, que incluye muchos condimentos sin sal.  Ensaladas: col rizada, espinacas, repollo, mezcla de primavera; use semillas como pipas de calabaza o pipas de girasol, almendras; tambin puedes usar 1-2 huevos duros en tus ensaladas Frutas: aguacates, bayas (arndanos, frambuesas, moras), manzanas, naranjas. Verduras: esprragos, coliflor, brcoli, judas verdes, picos de [place], pimientos morrones; Mantngase alejado de verduras con [employer] papas, zanahorias, guisantes.  En cuanto a la carne, es mejor comer carnes [place] y limitar el consumo de carnes rojas, incluido el cerdo. Pescado salvaje, pechuga de pollo son buenas opciones.  No coma ningn alimento de esta lista al que sea alrgico.

## 2020-02-22 NOTE — ED Provider Notes (Signed)
[place]   MRN: [id number] DOB: March 24, 1976  Subjective:   Corey Graham is a 44 y.o. male presenting for 1 week hx of persistent left lower leg pain, redness and swelling.  Patient had an abscess that he has expressed and admittedly has gotten better in the past few days.  However he is concerned because he has diabetes that he has not managed consistently.  Has not had a good relationship with his PCP in the past due to feeling criticized.  Would like to get help finding a new one.  Denies fever, streaking, nausea, vomiting, belly pain.  Denies calf pain, redness and swelling.  No current facility-administered medications for this encounter.  Current Outpatient Medications:  .  AgaMatrix Ultra-Thin Lancets MISC, Check blood glucose twice daily before meals, Disp: 100 each, Rfl: 11 .  Blood Glucose Monitoring Suppl (AGAMATRIX PRESTO) w/Device KIT, Check sugars twice daily before meals (Patient not taking: Reported on 04/11/2018), Disp: 1 kit, Rfl: 0 .  glipiZIDE (GLUCOTROL) 5 MG tablet, 1/2 tab by mouth twice daily with meals, Disp: 30 tablet, Rfl: 11 .  glucose blood (AGAMATRIX PRESTO TEST) test strip, Check blood glucose twice daily before meals, Disp: 100 each, Rfl: 12 .  lisinopril (PRINIVIL,ZESTRIL) 5 MG tablet, 1/2 tab by mouth daily, Disp: 30 tablet, Rfl: 11 .  metFORMIN (GLUCOPHAGE-XR) 500 MG 24 hr tablet, 2 tabs by mouth twice daily with meals, Disp: 120 tablet, Rfl: 11 .  ofloxacin (OCUFLOX) 0.3 % ophthalmic solution, Place 1 drop into both eyes 4 (four) times daily., Disp: 5 mL, Rfl: 0   No Known Allergies  Past Medical History:  Diagnosis Date  . Diabetes mellitus without complication ([place]) 0355  . Essential hypertension 09/09/2017  . Low back pain 06/24/2017     Past Surgical History:  Procedure Laterality Date  . HERNIA REPAIR      No family history on file.  Social History   Tobacco Use  . Smoking status: Never Smoker  . Smokeless tobacco:  Never Used  Substance Use Topics  . Alcohol use: Yes    Comment: infrequent  . Drug use: No    ROS   Objective:   Vitals: BP (!) 143/90   Pulse 85   Temp 98.3 F (36.8 C) (Oral)   Resp 16   SpO2 98%   Physical Exam Constitutional:      General: He is not in acute distress.    Appearance: Normal appearance. He is well-developed and normal weight. He is not ill-appearing, toxic-appearing or diaphoretic.  HENT:     Head: Normocephalic and atraumatic.     Right Ear: External ear normal.     Left Ear: External ear normal.     Nose: Nose normal.     Mouth/Throat:     Pharynx: Oropharynx is clear.  Eyes:     General: No scleral icterus.       Right eye: No discharge.        Left eye: No discharge.     Extraocular Movements: Extraocular movements intact.     Pupils: Pupils are equal, round, and reactive to light.  Cardiovascular:     Rate and Rhythm: Normal rate.  Pulmonary:     Effort: Pulmonary effort is normal.  Musculoskeletal:     Cervical back: Normal range of motion.  Skin:      Neurological:     Mental Status: He is alert and oriented to person, place, and time.  Psychiatric:  Mood and Affect: Mood normal.        Behavior: Behavior normal.        Thought Content: Thought content normal.        Judgment: Judgment normal.     Assessment and Plan :   1. Cellulitis and abscess of leg   2. Left leg pain   3. Uncontrolled type 2 diabetes mellitus with hyperglycemia (HCC)     Wound is not amenable to drainage as there is no fluctuance.  Start doxycycline for cellulitis and abscess of left lower leg.  No evidence of DVT.  Recommended dietary modifications, follow-up with primary care [place] for reestablishing care with a new PCP. Counseled patient on potential for adverse effects with medications prescribed/recommended today, ER and return-to-clinic precautions discussed, patient verbalized understanding.    Jaynee Eagles, [place] 02/22/20 1644

## 2020-02-22 NOTE — ED Triage Notes (Signed)
PT has red swollen area over left lower leg for one week. Surrounding warmth and redness.

## 2020-03-21 ENCOUNTER — Other Ambulatory Visit: Payer: Self-pay | Admitting: Internal Medicine

## 2020-03-25 ENCOUNTER — Other Ambulatory Visit: Payer: Self-pay | Admitting: Internal Medicine

## 2020-03-31 ENCOUNTER — Ambulatory Visit (INDEPENDENT_AMBULATORY_CARE_PROVIDER_SITE_OTHER): Payer: Self-pay | Admitting: Primary Care

## 2020-04-01 ENCOUNTER — Ambulatory Visit (INDEPENDENT_AMBULATORY_CARE_PROVIDER_SITE_OTHER): Payer: Self-pay | Admitting: Primary Care

## 2020-04-01 ENCOUNTER — Other Ambulatory Visit: Payer: Self-pay

## 2020-04-01 ENCOUNTER — Encounter (INDEPENDENT_AMBULATORY_CARE_PROVIDER_SITE_OTHER): Payer: Self-pay | Admitting: Primary Care

## 2020-04-01 VITALS — BP 137/92 | HR 83 | Temp 97.3°F | Ht 64.57 in | Wt 166.4 lb

## 2020-04-01 DIAGNOSIS — G8929 Other chronic pain: Secondary | ICD-10-CM

## 2020-04-01 DIAGNOSIS — M545 Low back pain, unspecified: Secondary | ICD-10-CM

## 2020-04-01 DIAGNOSIS — I10 Essential (primary) hypertension: Secondary | ICD-10-CM

## 2020-04-01 DIAGNOSIS — Z13220 Encounter for screening for lipoid disorders: Secondary | ICD-10-CM

## 2020-04-01 DIAGNOSIS — Z131 Encounter for screening for diabetes mellitus: Secondary | ICD-10-CM

## 2020-04-01 DIAGNOSIS — E1165 Type 2 diabetes mellitus with hyperglycemia: Secondary | ICD-10-CM

## 2020-04-01 LAB — POCT GLYCOSYLATED HEMOGLOBIN (HGB A1C): Hemoglobin A1C: 13.2 % — AB (ref 4.0–5.6)

## 2020-04-01 MED ORDER — LISINOPRIL 20 MG PO TABS: ORAL_TABLET | ORAL | 1 refills | Status: DC

## 2020-04-01 MED ORDER — JANUMET 50-1000 MG PO TABS: 1.0000 | ORAL_TABLET | Freq: Two times a day (BID) | ORAL | 1 refills | Status: DC

## 2020-04-01 MED ORDER — GLIPIZIDE 10 MG PO TABS: ORAL_TABLET | ORAL | 1 refills | Status: DC

## 2020-04-01 MED ORDER — IBUPROFEN 800 MG PO TABS: 800.0000 mg | ORAL_TABLET | Freq: Three times a day (TID) | ORAL | 1 refills | Status: DC | PRN

## 2020-04-01 MED FILL — IBUPROFEN 800 MG TABLET: 800 | 30 days supply | Qty: 90 | Fill #0

## 2020-04-01 MED FILL — glipiZIDE 10 MG TABS: 10 | 30 days supply | Qty: 60 | Fill #0

## 2020-04-01 MED FILL — JANUMET 50-1,000 MG TABLET: 50-1000 | 30 days supply | Qty: 60 | Fill #0

## 2020-04-01 MED FILL — LISINOPRIL 20 MG TABLET: 20 | 30 days supply | Qty: 30 | Fill #0

## 2020-04-01 NOTE — Progress Notes (Signed)
Pt has some lower back pain since yesterday

## 2020-04-01 NOTE — Patient Instructions (Signed)
Diabetes mellitus y nutricin, en adultos Diabetes Mellitus and Nutrition, Adult Si sufre de diabetes (diabetes mellitus), es muy importante tener hbitos alimenticios saludables debido a que sus niveles de azcar en la sangre (glucosa) se ven afectados en gran medida por lo que come y bebe. [place]er alimentos saludables en las cantidades adecuadas, aproximadamente a la misma hora todos los das, lo ayudar a:  Controlar la glucemia.  Disminuir el riesgo de sufrir una enfermedad cardaca.  Mejorar la presin arterial.  Alcanzar o mantener un peso saludable. Todas las personas que sufren de diabetes son diferentes y cada una tiene necesidades diferentes en cuanto a un plan de alimentacin. El mdico puede recomendarle que trabaje con un especialista en dietas y nutricin (nutricionista) para elaborar el mejor plan para usted. Su plan de alimentacin puede variar segn factores como:  Las caloras que necesita.  Los medicamentos que toma.  Su peso.  Sus niveles de glucemia, presin arterial y colesterol.  Su nivel de actividad.  Otras afecciones que tenga, como enfermedades cardacas o renales. Cmo me afectan los carbohidratos? Los carbohidratos, o hidratos de carbono, afectan su nivel de glucemia ms que cualquier otro tipo de alimento. La ingesta de carbohidratos naturalmente aumenta la cantidad de glucosa en la sangre. El recuento de carbohidratos es un mtodo destinado a llevar un registro de la cantidad de carbohidratos que se consumen. El recuento de carbohidratos es importante para mantener la glucemia a un nivel saludable, especialmente si utiliza insulina o toma determinados medicamentos por va oral para la diabetes. Es importante conocer la cantidad de carbohidratos que se pueden ingerir en cada comida sin correr ningn riesgo. Esto es diferente en cada persona. Su nutricionista puede ayudarlo a calcular la cantidad de carbohidratos que debe ingerir en cada comida y en cada  refrigerio. Entre los alimentos que contienen carbohidratos, se incluyen:  Pan, cereal, arroz, pastas y galletas.  Papas y maz.  Guisantes, frijoles y lentejas.  Leche y yogur.  Frutas y jugo.  Postres, como pasteles, galletas, helado y caramelos. Cmo me afecta el alcohol? El alcohol puede provocar disminuciones sbitas de la glucemia (hipoglucemia), especialmente si utiliza insulina o toma determinados medicamentos por va oral para la diabetes. La hipoglucemia es una afeccin potencialmente mortal. Los sntomas de la hipoglucemia (somnolencia, mareos y confusin) son similares a los sntomas de haber consumido demasiado alcohol. Si el mdico afirma que el alcohol es seguro para usted, siga estas pautas:  Limite el consumo de alcohol a no ms de 1medida por da si es mujer y no est embarazada, y a 2medidas si es hombre. Una medida equivale a 12oz (355ml) de cerveza, 5oz (148ml) de vino o 1oz (44ml) de bebidas alcohlicas de alta graduacin.  No beba con el estmago vaco.  Mantngase hidratado bebiendo agua, refrescos dietticos o t helado sin azcar.  Tenga en cuenta que los refrescos comunes, los jugos y otras bebida para mezclar pueden contener mucha azcar y se deben contar como carbohidratos. Cules son algunos consejos para seguir este plan?  Leer las etiquetas de los alimentos  Comience por leer el tamao de la porcin en la "Informacin nutricional" en las etiquetas de los alimentos envasados y las bebidas. La cantidad de caloras, carbohidratos, grasas y otros nutrientes mencionados en la etiqueta se basan en una porcin del alimento. Muchos alimentos contienen ms de una porcin por envase.  Verifique la cantidad total de gramos (g) de carbohidratos totales en una porcin. Puede calcular la cantidad de porciones de carbohidratos al dividir el   total de carbohidratos por 15. Por ejemplo, si un alimento tiene un total de 30g de carbohidratos, equivale a 2  porciones de carbohidratos.  Verifique la cantidad de gramos (g) de grasas saturadas y grasas trans en una porcin. Escoja alimentos que no contengan grasa o que tengan un bajo contenido.  Verifique la cantidad de miligramos (mg) de sal (sodio) en una porcin. La mayora de las personas deben limitar la ingesta de sodio total a menos de 2300mg por da.  Siempre consulte la informacin nutricional de los alimentos etiquetados como "con bajo contenido de grasa" o "sin grasa". Estos alimentos pueden tener un mayor contenido de azcar agregada o carbohidratos refinados, y deben evitarse.  Hable con su nutricionista para identificar sus objetivos diarios en cuanto a los nutrientes mencionados en la etiqueta. Al ir de compras  Evite comprar alimentos procesados, enlatados o precocinados. Estos alimentos tienden a tener una mayor cantidad de grasa, sodio y azcar agregada.  Compre en la zona exterior de la tienda de comestibles. Esta zona incluye frutas y verduras frescas, granos a granel, carnes frescas y productos lcteos frescos. Al cocinar  Utilice mtodos de coccin a baja temperatura, como hornear, en lugar de mtodos de coccin a alta temperatura, como frer en abundante aceite.  Cocine con aceites saludables, como el aceite de oliva, canola o girasol.  Evite cocinar con manteca, crema o carnes con alto contenido de grasa. Planificacin de las comidas  Coma las comidas y los refrigerios regularmente, preferentemente a la misma hora todos los das. Evite pasar largos perodos de tiempo sin comer.  Consuma alimentos ricos en fibra, como frutas frescas, verduras, frijoles y cereales integrales. Consulte a su nutricionista sobre cuntas porciones de carbohidratos puede consumir en cada comida.  Consuma entre 4 y 6 onzas (oz) de protenas magras por da, como carnes magras, pollo, pescado, huevos o tofu. Una onza de protena magra equivale a: ? 1 onza de carne, pollo o  pescado. ? 1huevo. ?  taza de tofu.  Coma algunos alimentos por da que contengan grasas saludables, como aguacates, frutos secos, semillas y pescado. Estilo de vida  Controle su nivel de glucemia con regularidad.  Haga actividad fsica habitualmente como se lo haya indicado el mdico. Esto puede incluir lo siguiente: ? 150minutos semanales de ejercicio de intensidad moderada o alta. Esto podra incluir caminatas dinmicas, ciclismo o gimnasia acutica. ? Realizar ejercicios de elongacin y de fortalecimiento, como yoga o levantamiento de pesas, por lo menos 2veces por semana.  Tome los medicamentos como se lo haya indicado el mdico.  No consuma ningn producto que contenga nicotina o tabaco, como cigarrillos y cigarrillos electrnicos. Si necesita ayuda para dejar de fumar, consulte al mdico.  Trabaje con un asesor o instructor en diabetes para identificar estrategias para controlar el estrs y cualquier desafo emocional y social. Preguntas para hacerle al mdico  Es necesario que consulte a un instructor en el cuidado de la diabetes?  Es necesario que me rena con un nutricionista?  A qu nmero puedo llamar si tengo preguntas?  Cules son los mejores momentos para controlar la glucemia? Dnde encontrar ms informacin:  Asociacin Estadounidense de la Diabetes (American Diabetes Association): diabetes.org  Academia de Nutricin y Diettica (Academy of Nutrition and Dietetics): www.eatright.org  Instituto Nacional de la Diabetes y las Enfermedades Digestivas y Renales (National Institute of Diabetes and Digestive and Kidney Diseases, NIH): www.niddk.nih.gov Resumen  Un plan de alimentacin saludable lo ayudar a controlar la glucemia y mantener un estilo de vida saludable.    Trabajar con un especialista en dietas y nutricin (nutricionista) puede ayudarlo a elaborar el mejor plan de alimentacin para usted.  Tenga en cuenta que los carbohidratos (hidratos de  carbono) y el alcohol tienen efectos inmediatos en sus niveles de glucemia. Es importante contar los carbohidratos que ingiere y consumir alcohol con prudencia. Esta informacin no tiene como fin reemplazar el consejo del mdico. Asegrese de hacerle al mdico cualquier pregunta que tenga. Document Revised: 08/13/2017 Document Reviewed: 03/25/2017 Elsevier Patient Education  2020 Elsevier Inc.  

## 2020-04-01 NOTE — Progress Notes (Signed)
 New Patient Office Visit  Subjective:  Patient ID: Corey Graham, male    DOB: 02/05/1976  Age: 43 y.o. MRN: [id number]  CC:  Chief Complaint  Patient presents with  . New Patient (Initial Visit)    HPI ( Interpretor Jesus [id number]) [name] is a 43 year old Hispanic male which has not had medical attention for several years he is a type 2 diabetes and hypertension. Denies shortness of breath, headaches, chest pain or lower extremity edema, sudden onset, vision changes, unilateral weakness, dizziness, paresthesias.   Past Medical History:  Diagnosis Date  . Diabetes mellitus without complication (HCC) 2012  . Essential hypertension 09/09/2017  . Low back pain 06/24/2017    Past Surgical History:  Procedure Laterality Date  . HERNIA REPAIR      History reviewed. No pertinent family history.  Social History   Socioeconomic History  . Marital status: Married    Spouse name: Not on file  . Number of children: 3  . Years of education: Not on file  . Highest education level: Not on file  Occupational History  . Not on file  Tobacco Use  . Smoking status: Never Smoker  . Smokeless tobacco: Never Used  Substance and Sexual Activity  . Alcohol use: Yes    Comment: infrequent  . Drug use: No  . Sexual activity: Yes  Other Topics Concern  . Not on file  Social History Narrative   LIves at home with wife and 3 children   Social Determinants of Health   Financial Resource Strain:   . Difficulty of Paying Living Expenses:   Food Insecurity:   . Worried About Running Out of Food in the Last Year:   . Ran Out of Food in the Last Year:   Transportation Needs:   . Lack of Transportation (Medical):   . Lack of Transportation (Non-Medical):   Physical Activity:   . Days of Exercise per Week:   . Minutes of Exercise per Session:   Stress:   . Feeling of Stress :   Social Connections:   . Frequency of Communication with Friends and Family:    . Frequency of Social Gatherings with Friends and Family:   . Attends Religious Services:   . Active Member of Clubs or Organizations:   . Attends Club or Organization Meetings:   . Marital Status:   Intimate Partner Violence:   . Fear of Current or Ex-Partner:   . Emotionally Abused:   . Physically Abused:   . Sexually Abused:     ROS Review of Systems  All other systems reviewed and are negative.   Objective:   Today's Vitals: BP (!) 137/92 (BP Location: Right Arm, Patient Position: Sitting, Cuff Size: Normal)   Pulse 83   Temp (!) 97.3 F (36.3 C) (Temporal)   Ht 5' 4.57" (1.64 m)   Wt 166 lb 6.4 oz (75.5 kg)   SpO2 97%   BMI 28.06 kg/m   Physical Exam Vitals reviewed.  Constitutional:      Appearance: Normal appearance.  HENT:     Head: Normocephalic.     Nose: Nose normal.  Eyes:     Extraocular Movements: Extraocular movements intact.     Pupils: Pupils are equal, round, and reactive to light.  Cardiovascular:     Rate and Rhythm: Normal rate and regular rhythm.  Pulmonary:     Effort: Pulmonary effort is normal.     Breath sounds: Normal breath sounds.    Abdominal:     General: Bowel sounds are normal.     Palpations: Abdomen is soft.  Musculoskeletal:        General: Normal range of motion.     Cervical back: Normal range of motion.  Skin:    General: Skin is warm and dry.  Neurological:     Mental Status: He is alert and oriented to person, place, and time.  Psychiatric:        Mood and Affect: Mood normal.        Behavior: Behavior normal.        Thought Content: Thought content normal.        Judgment: Judgment normal.     Assessment & Plan:  Corey Graham was seen today for new patient (initial visit).  Diagnoses and all orders for this visit:  Screening for diabetes mellitus -     HgB A1c  Essential hypertension Counseled on blood pressure goal of less than 130/80, low-sodium, DASH diet, medication compliance, 150 minutes of moderate  intensity exercise per week. Discussed medication compliance, adverse effects. -     Comprehensive metabolic panel  Uncontrolled type 2 diabetes mellitus with hyperglycemia, without long-term current use of insulin (HCC) ADA recommends the following therapeutic goals for glycemic control related to A1c measurements: Goal of therapy: Less than 6.5 hemoglobin A1c.  Reference clinical practice recommendations. Foods that are high in carbohydrates are the following rice, potatoes, breads, sugars, and pastas.  Reduction in the intake (eating) will assist in lowering your blood sugars. -     Comprehensive metabolic panel -     CBC with Differential  Lipid screening -     Lipid Panel  Chronic bilateral low back pain without sciatica BACK PAIN  Location: lumbar/sacral Quality: burning, heaviness, pressure, squeezing and Onset: gradual Worse with: bend       Better with: rest  Radiation: no Trauma: shovel to move feed for animals  Best sitting/standing/leaning forward: worse  Red Flags Fecal/urinary incontinence: no  Numbness/Weakness: no  Fever/chills/sweats: no  Night pain: yes  Unexplained weight loss: no  No relief with bedrest: yes  h/o cancer/immunosuppression:   Other orders -     lisinopril (ZESTRIL) 20 MG tablet; Take 1 tab by mouth daily -     glipiZIDE (GLUCOTROL) 10 MG tablet; Take 1 tab by mouth twice daily with meals -     sitaGLIPtin-metformin (JANUMET) 50-1000 MG tablet; Take 1 tablet by mouth 2 (two) times daily with a meal. -     ibuprofen (ADVIL) 800 MG tablet; Take 1 tablet (800 mg total) by mouth every 8 (eight) hours as needed for moderate pain.    Outpatient Encounter Medications as of 04/01/2020  Medication Sig  . glipiZIDE (GLUCOTROL) 10 MG tablet Take 1 tab by mouth twice daily with meals  . lisinopril (ZESTRIL) 20 MG tablet Take 1 tab by mouth daily  . metFORMIN (GLUCOPHAGE-XR) 500 MG 24 hr tablet 2 tabs by mouth twice daily with meals  . [DISCONTINUED]  glipiZIDE (GLUCOTROL) 5 MG tablet 1/2 tab by mouth twice daily with meals  . [DISCONTINUED] lisinopril (PRINIVIL,ZESTRIL) 5 MG tablet 1/2 tab by mouth daily  . AgaMatrix Ultra-Thin Lancets MISC Check blood glucose twice daily before meals (Patient not taking: Reported on 04/01/2020)  . Blood Glucose Monitoring Suppl (AGAMATRIX PRESTO) w/Device KIT Check sugars twice daily before meals (Patient not taking: Reported on 04/01/2020)  . glucose blood (AGAMATRIX PRESTO TEST) test strip Check blood glucose twice daily before meals (Patient  not taking: Reported on 04/01/2020)  . ibuprofen (ADVIL) 800 MG tablet Take 1 tablet (800 mg total) by mouth every 8 (eight) hours as needed for moderate pain.  . sitaGLIPtin-metformin (JANUMET) 50-1000 MG tablet Take 1 tablet by mouth 2 (two) times daily with a meal.  . [DISCONTINUED] doxycycline (VIBRAMYCIN) 100 MG capsule Take 1 capsule (100 mg total) by mouth 2 (two) times daily.  . [DISCONTINUED] naproxen (NAPROSYN) 500 MG tablet Take 1 tablet (500 mg total) by mouth 2 (two) times daily.   No facility-administered encounter medications on file as of 04/01/2020.     {Follow-up: Return in about 4 weeks (around 04/29/2020) for in person Bp check.   Michelle P Edwards, NP 

## 2020-04-02 LAB — CBC WITH DIFFERENTIAL/PLATELET
Basophils Absolute: 0.1 10*3/uL (ref 0.0–0.2)
Basos: 1 %
EOS (ABSOLUTE): 0.5 10*3/uL — ABNORMAL HIGH (ref 0.0–0.4)
Eos: 6 %
Hematocrit: 49 % (ref 37.5–51.0)
Hemoglobin: 16.5 g/dL (ref 13.0–17.7)
Immature Grans (Abs): 0 10*3/uL (ref 0.0–0.1)
Immature Granulocytes: 0 %
Lymphocytes Absolute: 2.9 10*3/uL (ref 0.7–3.1)
Lymphs: 36 %
MCH: 30.3 pg (ref 26.6–33.0)
MCHC: 33.7 g/dL (ref 31.5–35.7)
MCV: 90 fL (ref 79–97)
Monocytes Absolute: 0.5 10*3/uL (ref 0.1–0.9)
Monocytes: 6 %
Neutrophils Absolute: 4.1 10*3/uL (ref 1.4–7.0)
Neutrophils: 51 %
Platelets: 226 10*3/uL (ref 150–450)
RBC: 5.45 x10E6/uL (ref 4.14–5.80)
RDW: 12.4 % (ref 11.6–15.4)
WBC: 8.1 10*3/uL (ref 3.4–10.8)

## 2020-04-02 LAB — LIPID PANEL
Chol/HDL Ratio: 5.6 ratio — ABNORMAL HIGH (ref 0.0–5.0)
Cholesterol, Total: 282 mg/dL — ABNORMAL HIGH (ref 100–199)
HDL: 50 mg/dL (ref >39)
LDL Chol Calc (NIH): 170 mg/dL — ABNORMAL HIGH (ref 0–99)
Triglycerides: 324 mg/dL — ABNORMAL HIGH (ref 0–149)
VLDL Cholesterol Cal: 62 mg/dL — ABNORMAL HIGH (ref 5–40)

## 2020-04-02 LAB — COMPREHENSIVE METABOLIC PANEL
ALT: 23 IU/L (ref 0–44)
AST: 24 IU/L (ref 0–40)
Albumin/Globulin Ratio: 1.7 (ref 1.2–2.2)
Albumin: 4.5 g/dL (ref 4.0–5.0)
Alkaline Phosphatase: 150 IU/L — ABNORMAL HIGH (ref 39–117)
BUN/Creatinine Ratio: 14 (ref 9–20)
BUN: 10 mg/dL (ref 6–24)
Bilirubin Total: 0.4 mg/dL (ref 0.0–1.2)
CO2: 22 mmol/L (ref 20–29)
Calcium: 9.6 mg/dL (ref 8.7–10.2)
Chloride: 97 mmol/L (ref 96–106)
Creatinine, Ser: 0.71 mg/dL — ABNORMAL LOW (ref 0.76–1.27)
GFR calc Af Amer: 134 mL/min/{1.73_m2} (ref >59)
GFR calc non Af Amer: 116 mL/min/{1.73_m2} (ref >59)
Globulin, Total: 2.6 g/dL (ref 1.5–4.5)
Glucose: 232 mg/dL — ABNORMAL HIGH (ref 65–99)
Potassium: 4.8 mmol/L (ref 3.5–5.2)
Sodium: 137 mmol/L (ref 134–144)
Total Protein: 7.1 g/dL (ref 6.0–8.5)

## 2020-04-05 ENCOUNTER — Other Ambulatory Visit (INDEPENDENT_AMBULATORY_CARE_PROVIDER_SITE_OTHER): Payer: Self-pay | Admitting: Primary Care

## 2020-04-05 MED ORDER — PRAVASTATIN SODIUM 40 MG PO TABS: 40.0000 mg | ORAL_TABLET | Freq: Every day | ORAL | 3 refills | Status: DC

## 2020-04-05 MED FILL — PRAVASTATIN NA 40 MG TAB: 40 | 30 days supply | Qty: 30 | Fill #0

## 2020-04-05 NOTE — Progress Notes (Unsigned)
prava

## 2020-04-06 ENCOUNTER — Telehealth (INDEPENDENT_AMBULATORY_CARE_PROVIDER_SITE_OTHER): Payer: Self-pay

## 2020-04-06 NOTE — Telephone Encounter (Signed)
Patient called to inform PCP that these are the medication that he has Lisinopril 5 mg, Glipizide 5 mg, Metformin.  This is just an [place]

## 2020-04-06 NOTE — Telephone Encounter (Signed)
Sent to PCP for notification.

## 2020-04-08 ENCOUNTER — Telehealth (INDEPENDENT_AMBULATORY_CARE_PROVIDER_SITE_OTHER): Payer: Self-pay

## 2020-04-08 NOTE — Telephone Encounter (Signed)
Call placed to patient using pacific interpreter [phone]) patient verified date of birth. He is aware of elevated cholesterol which can lead to stroke and heart attack. Advised him to decrease fatty food, red meat, cheese and milk. Advised to increase fiber with whole grains and veggies. Patient aware that cholesterol medication was sent to [facility] pharmacy. Maryjean Morn, CMA

## 2020-04-08 NOTE — Telephone Encounter (Signed)
-----   Message from Grayce Sessions, NP sent at 04/05/2020 10:09 AM EDT ----- Sent pravastatin 40mg  to [facility]. Cholesterol that can lead to heart attack and stroke. Decrease your fatty foods, red meat, cheese, milk and increase fiber like whole grains and veggies.

## 2020-04-29 ENCOUNTER — Other Ambulatory Visit: Payer: Self-pay

## 2020-04-29 ENCOUNTER — Ambulatory Visit (INDEPENDENT_AMBULATORY_CARE_PROVIDER_SITE_OTHER): Payer: Self-pay | Admitting: Primary Care

## 2020-04-29 VITALS — BP 151/100 | HR 82 | Ht 64.57 in | Wt 169.0 lb

## 2020-04-29 DIAGNOSIS — E1165 Type 2 diabetes mellitus with hyperglycemia: Secondary | ICD-10-CM

## 2020-04-29 DIAGNOSIS — I10 Essential (primary) hypertension: Secondary | ICD-10-CM

## 2020-04-29 DIAGNOSIS — G44219 Episodic tension-type headache, not intractable: Secondary | ICD-10-CM

## 2020-04-29 LAB — GLUCOSE, POCT (MANUAL RESULT ENTRY): POC Glucose: 143 mg/dl — AB (ref 70–99)

## 2020-04-29 MED ORDER — AMLODIPINE BESYLATE 10 MG PO TABS: 10.0000 mg | ORAL_TABLET | Freq: Every day | ORAL | 3 refills | Status: DC

## 2020-04-29 NOTE — Progress Notes (Signed)
Presents for  hypertension evaluation, on previous visit medication was adjusted to include increasing lisinopril '20mg'$  daily.  Patient reports adherence with medications.  Current Medication List Current Outpatient Medications on File Prior to Visit  Medication Sig Dispense Refill  . AgaMatrix Ultra-Thin Lancets MISC Check blood glucose twice daily before meals 100 each 11  . Blood Glucose Monitoring Suppl (AGAMATRIX PRESTO) w/Device KIT Check sugars twice daily before meals 1 kit 0  . glipiZIDE (GLUCOTROL) 10 MG tablet Take 1 tab by mouth twice daily with meals 180 tablet 1  . glucose blood (AGAMATRIX PRESTO TEST) test strip Check blood glucose twice daily before meals 100 each 12  . ibuprofen (ADVIL) 800 MG tablet Take 1 tablet (800 mg total) by mouth every 8 (eight) hours as needed for moderate pain. 90 tablet 1  . lisinopril (ZESTRIL) 20 MG tablet Take 1 tab by mouth daily 90 tablet 1  . pravastatin (PRAVACHOL) 40 MG tablet Take 1 tablet (40 mg total) by mouth daily. 90 tablet 3  . sitaGLIPtin-metformin (JANUMET) 50-1000 MG tablet Take 1 tablet by mouth 2 (two) times daily with a meal. 180 tablet 1   No current facility-administered medications on file prior to visit.   Past Medical History  Past Medical History:  Diagnosis Date  . Diabetes mellitus without complication ([place]) 0947  . Essential hypertension 09/09/2017  . Low back pain 06/24/2017   Dietary habits include: decreasing sodium and sugars in diet  Exercise habits include  Family / Social history: none  ASCVD risk factors include- Corey Graham  O:  Physical Exam Vitals reviewed.  Constitutional:      Appearance: Normal appearance.  HENT:     Nose: Nose normal.  Cardiovascular:     Rate and Rhythm: Normal rate and regular rhythm.  Pulmonary:     Effort: Pulmonary effort is normal.     Breath sounds: Normal breath sounds.  Abdominal:     General: Bowel sounds are normal.  Musculoskeletal:        General: Normal range  of motion.     Cervical back: Normal range of motion.  Skin:    General: Skin is warm and dry.  Neurological:     Mental Status: He is alert and oriented to person, place, and time.  Psychiatric:        Mood and Affect: Mood normal.        Behavior: Behavior normal.        Thought Content: Thought content normal.        Judgment: Judgment normal.      Review of Systems  Neurological: Positive for headaches.       Intermittent  All other systems reviewed and are negative.   Last 3 Office BP readings: BP Readings from Last 3 Encounters:  04/29/20 (!) 151/100  04/01/20 (!) 137/92  02/22/20 (!) 143/90    BMET    Component Value Date/Time   NA 137 04/01/2020 0957   K 4.8 04/01/2020 0957   CL 97 04/01/2020 0957   CO2 22 04/01/2020 0957   GLUCOSE 232 (H) 04/01/2020 0957   GLUCOSE 378 (H) 06/15/2014 2232   BUN 10 04/01/2020 0957   CREATININE 0.71 (L) 04/01/2020 0957   CALCIUM 9.6 04/01/2020 0957   GFRNONAA 116 04/01/2020 0957   GFRAA 134 04/01/2020 0957    Renal function: CrCl cannot be calculated (Patient's most recent lab result is older than the maximum 21 days allowed.).  Clinical ASCVD: Yes  The 10-year ASCVD risk  score Corey Bussing DC Brooke Bonito., et al., 2013) is: 8.1%   Values used to calculate the score:     Age: 44 years     Sex: Male     Is Non-Hispanic African American: No     Diabetic: Yes     Tobacco smoker: No     Systolic Blood Pressure: 677 mmHg     Is BP treated: Yes     HDL Cholesterol: 50 mg/dL     Total Cholesterol: 282 mg/dL  Diagnoses and all orders for this visit:  -    A/P:  Essential hypertension Hypertension longstanding on current medications. BP Goal = 130/80 mmHg. Patient is adherent with current medications.  -Started adding Norvasc 10, and lisinopril  -F/u labs ordered - none -Counseled on lifestyle modifications for blood pressure control including reduced dietary sodium, increased exercise, adequate sleep   Uncontrolled type 2  diabetes mellitus with hyperglycemia, without long-term current use of insulin (HCC) -     Glucose (CBG) 143  ADA recommends the following therapeutic goals for glycemic control related to A1c measurements: Goal of therapy: Less than 6.5 hemoglobin A1c.  Reference clinical practice recommendations. Foods that are high in carbohydrates are the following rice, potatoes, breads, sugars, and pastas.  Reduction in the intake (eating) will assist in lowering your blood sugars  Episodic tension-type headache, not intractable Headache Management:   Cool Compress. Lie down and place a cool compress on your head.   Avoid headache triggers. If certain foods or odors seem to have triggered your migraines in the past, avoid them. A headache diary might help you identify triggers.   Include physical activity in your daily routine. Try a daily walk or other moderate aerobic exercise.   Manage stress. Find healthy ways to cope with the stressors, such as delegating tasks on your to-do list.   Practice relaxation techniques. Try deep breathing, yoga, massage and visualization.   Eat regularly. Eating regularly scheduled meals and maintaining a healthy diet might help prevent headaches. Also, drink plenty of fluids.   Follow a regular sleep schedule. Sleep deprivation might contribute to headaches  Consider biofeedback. With this mind-body technique, you learn to control certain bodily functions -- such as muscle tension, heart rate and blood pressure -- to prevent headaches or reduce headache pain.

## 2020-04-29 NOTE — Progress Notes (Signed)
BP f/u  At times he does get headaches in the back of his head  [facility] [id number]  169lb

## 2020-05-09 ENCOUNTER — Other Ambulatory Visit: Payer: Self-pay

## 2020-05-09 ENCOUNTER — Ambulatory Visit: Payer: Self-pay

## 2020-05-11 MED FILL — AMLODIPINE BESYLATE 10 MG T: 10 | 30 days supply | Qty: 30 | Fill #0

## 2020-05-11 MED FILL — PRAVASTATIN NA 40 MG TAB: 40 | 30 days supply | Qty: 30 | Fill #1

## 2020-05-11 MED FILL — AMLODIPINE BESYLATE 10 MG T: 10 | 30 days supply | Qty: 30 | Fill #1

## 2020-05-20 MED FILL — IBUPROFEN 800 MG TABLET: 800 | 30 days supply | Qty: 90 | Fill #1

## 2020-05-20 MED FILL — PRAVASTATIN NA 40 MG TAB: 40 | 30 days supply | Qty: 30 | Fill #1

## 2020-05-20 MED FILL — AMLODIPINE BESYLATE 10 MG T: 10 | 30 days supply | Qty: 30 | Fill #0

## 2020-05-20 MED FILL — glipiZIDE 10 MG TABS: 10 | 30 days supply | Qty: 60 | Fill #1

## 2020-05-20 MED FILL — JANUMET 50-1,000 MG TABLET: 50-1000 | 30 days supply | Qty: 60 | Fill #1

## 2020-05-30 ENCOUNTER — Other Ambulatory Visit: Payer: Self-pay

## 2020-05-30 ENCOUNTER — Encounter (INDEPENDENT_AMBULATORY_CARE_PROVIDER_SITE_OTHER): Payer: Self-pay | Admitting: Primary Care

## 2020-05-30 ENCOUNTER — Ambulatory Visit (INDEPENDENT_AMBULATORY_CARE_PROVIDER_SITE_OTHER): Payer: Self-pay | Admitting: Primary Care

## 2020-05-30 VITALS — BP 138/90 | HR 91 | Temp 97.4°F | Ht 64.0 in | Wt 172.6 lb

## 2020-05-30 DIAGNOSIS — E1165 Type 2 diabetes mellitus with hyperglycemia: Secondary | ICD-10-CM

## 2020-05-30 DIAGNOSIS — E1149 Type 2 diabetes mellitus with other diabetic neurological complication: Secondary | ICD-10-CM

## 2020-05-30 DIAGNOSIS — I10 Essential (primary) hypertension: Secondary | ICD-10-CM

## 2020-05-30 LAB — POCT CBG (FASTING - GLUCOSE)-MANUAL ENTRY: Glucose Fasting, POC: 97 mg/dL (ref 70–99)

## 2020-05-30 MED ORDER — GABAPENTIN 100 MG PO CAPS: 100.0000 mg | ORAL_CAPSULE | Freq: Three times a day (TID) | ORAL | 3 refills | Status: DC

## 2020-05-30 NOTE — Progress Notes (Signed)
[facility] for  hypertension evaluation, on previous visit medication was adjusted to include lisinopril 20 mg daily   Patient reports adherence with medications.  Current Medication List Current Outpatient Medications on File Prior to Visit  Medication Sig Dispense Refill  . AgaMatrix Ultra-Thin Lancets MISC Check blood glucose twice daily before meals 100 each 11  . amLODipine (NORVASC) 10 MG tablet Take 1 tablet (10 mg total) by mouth daily. 90 tablet 3  . Blood Glucose Monitoring Suppl (AGAMATRIX PRESTO) w/Device KIT Check sugars twice daily before meals 1 kit 0  . glipiZIDE (GLUCOTROL) 10 MG tablet Take 1 tab by mouth twice daily with meals 180 tablet 1  . glucose blood (AGAMATRIX PRESTO TEST) test strip Check blood glucose twice daily before meals 100 each 12  . ibuprofen (ADVIL) 800 MG tablet Take 1 tablet (800 mg total) by mouth every 8 (eight) hours as needed for moderate pain. 90 tablet 1  . lisinopril (ZESTRIL) 20 MG tablet Take 1 tab by mouth daily 90 tablet 1  . pravastatin (PRAVACHOL) 40 MG tablet Take 1 tablet (40 mg total) by mouth daily. 90 tablet 3  . sitaGLIPtin-metformin (JANUMET) 50-1000 MG tablet Take 1 tablet by mouth 2 (two) times daily with a meal. 180 tablet 1   No current facility-administered medications on file prior to visit.   Past Medical History  Past Medical History:  Diagnosis Date  . Diabetes mellitus without complication ([place]) 5697  . Essential hypertension 09/09/2017  . Low back pain 06/24/2017   Dietary habits include:watching carbohydrates  Exercise habits include: walking Family / Social history: No  ASCVD risk factors include- [place]  O:  BP 138/90 (BP Location: Right Arm, Patient Position: Sitting, Cuff Size: Normal)   Pulse 91   Temp (!) 97.4 F (36.3 C) (Temporal)   Ht 5' 4" (1.626 m)   Wt 172 lb 9.6 oz (78.3 kg)   SpO2 94%   BMI 29.63 kg/m   Review of Systems  All other systems reviewed and are  negative. numbness and tingling in his hands  Polydipsia Polyuria   Physical Exam  General: Vital signs reviewed.  Patient is well-developed and well-nourished, in no acute distress and cooperative with exam.  Head: Normocephalic and atraumatic. Eyes: EOMI, conjunctivae normal, no scleral icterus.  Neck: Supple, trachea midline, normal ROM, no JVD, masses, thyromegaly, or carotid bruit present.  Cardiovascular: RRR, S1 normal, S2 normal, no murmurs, gallops, or rubs. Pulmonary/Chest: Clear to auscultation bilaterally, no wheezes, rales, or rhonchi. Abdominal: Soft, non-tender, non-distended, BS +, no masses, organomegaly, or guarding present.  Musculoskeletal: No joint deformities, erythema, or stiffness, ROM full and nontender. Extremities: No lower extremity edema bilaterally,  pulses symmetric and intact bilaterally. No cyanosis or clubbing. Neurological: A&O x3, Strength is normal and symmetric bilaterally, cranial nerve II-XII are grossly intact, no focal motor deficit, sensory intact to light touch bilaterally.  Skin: Warm, dry and intact. No rashes or erythema. Psychiatric: Normal mood and affect. speech and behavior is normal. Cognition and memory are normal.  Last 3 Office BP readings: BP Readings from Last 3 Encounters:  05/30/20 138/90  04/29/20 (!) 151/100  04/01/20 (!) 137/92    BMET    Component Value Date/Time   NA 137 04/01/2020 0957   K 4.8 04/01/2020 0957   CL 97 04/01/2020 0957   CO2 22 04/01/2020 0957   GLUCOSE 232 (H) 04/01/2020 0957   GLUCOSE 378 (H) 06/15/2014 2232   BUN 10 04/01/2020  0957   CREATININE 0.71 (L) 04/01/2020 0957   CALCIUM 9.6 04/01/2020 0957   GFRNONAA 116 04/01/2020 0957   GFRAA 134 04/01/2020 0957    Renal function: CrCl cannot be calculated (Patient's most recent lab result is older than the maximum 21 days allowed.).  Clinical ASCVD: Yes  The 10-year ASCVD risk score Mikey Bussing DC Jr., et al., 2013) is: 7.6%   Values used to calculate  the score:     Age: 44 years     Sex: Male     Is Non-Hispanic African American: No     Diabetic: Yes     Tobacco smoker: No     Systolic Blood Pressure: 321 mmHg     Is BP treated: Yes     HDL Cholesterol: 50 mg/dL     Total Cholesterol: 282 mg/dL   A/P: Quay was seen today for blood pressure check.  Diagnoses and all orders for this visit:  Uncontrolled type 2 diabetes mellitus with hyperglycemia, without long-term current use of insulin (HCC) -     Glucose (CBG), Fasting Goal of therapy: Less than 6.5 hemoglobin A1c. Decrease foods that are high in carbohydrates are the following rice, potatoes, breads, sugars, and pastas.  Reduction in the intake (eating) will assist in lowering your blood sugars.  Other diabetic neurological complication associated with type 2 diabetes mellitus ([place]) Other orders Experiencing  numbness and, burning, tingling,in his fingers   Will prescribe -     gabapentin (NEURONTIN) 100 MG capsule; Take 1 capsule (100 mg total) by mouth 3 (three) times daily.  Essential hypertension Hypertension longstanding currently amlopdipine 48m daily and lisinopril 226mdaily on current medications. BP Goal = 130/80 mmHg. Patient is adherent with current medications.  -Continued .  -F/u labs ordered - -Counseled on lifestyle modifications for blood pressure control including reduced dietary sodium, increased exercise, adequate sleep  MiJuluis MireP-C

## 2020-06-07 MED FILL — LISINOPRIL 20 MG TABLET: 20 | 30 days supply | Qty: 30 | Fill #1

## 2020-06-13 ENCOUNTER — Telehealth: Payer: Self-pay | Admitting: Primary Care

## 2020-06-13 NOTE — Telephone Encounter (Signed)
I received a msg from FO to call the Pt, I called and LVM to call me back to see how I can help him

## 2020-08-02 ENCOUNTER — Ambulatory Visit (INDEPENDENT_AMBULATORY_CARE_PROVIDER_SITE_OTHER): Payer: Self-pay | Admitting: Primary Care

## 2020-08-05 ENCOUNTER — Encounter (INDEPENDENT_AMBULATORY_CARE_PROVIDER_SITE_OTHER): Payer: Self-pay | Admitting: Primary Care

## 2020-08-05 ENCOUNTER — Ambulatory Visit (INDEPENDENT_AMBULATORY_CARE_PROVIDER_SITE_OTHER): Payer: Self-pay | Admitting: Primary Care

## 2020-08-05 ENCOUNTER — Other Ambulatory Visit: Payer: Self-pay

## 2020-08-05 VITALS — BP 152/96 | HR 93 | Ht 64.0 in | Wt 176.4 lb

## 2020-08-05 DIAGNOSIS — I10 Essential (primary) hypertension: Secondary | ICD-10-CM

## 2020-08-05 DIAGNOSIS — E1165 Type 2 diabetes mellitus with hyperglycemia: Secondary | ICD-10-CM

## 2020-08-05 LAB — POCT GLYCOSYLATED HEMOGLOBIN (HGB A1C): HbA1c, POC (controlled diabetic range): 9.3 % — AB (ref 0.0–7.0)

## 2020-08-05 LAB — GLUCOSE, POCT (MANUAL RESULT ENTRY): POC Glucose: 225 mg/dl — AB (ref 70–99)

## 2020-08-05 MED ORDER — LISINOPRIL 40 MG PO TABS: ORAL_TABLET | ORAL | 1 refills | Status: DC

## 2020-08-05 MED ORDER — JANUMET 50-1000 MG PO TABS: 1.0000 | ORAL_TABLET | Freq: Two times a day (BID) | ORAL | 1 refills | Status: DC

## 2020-08-05 MED ORDER — GLIPIZIDE 10 MG PO TABS: ORAL_TABLET | ORAL | 1 refills | Status: DC

## 2020-08-05 MED FILL — ?GLIPIZIDE 10 MG TABLET: 10 | 30 days supply | Qty: 60 | Fill #0

## 2020-08-05 MED FILL — LISINOPRIL 40 MG TABLET: 40 | 90 days supply | Qty: 90 | Fill #0

## 2020-08-05 MED FILL — !JANUMET 50-1000MG TABLET: 50-1000 | 30 days supply | Qty: 60 | Fill #0

## 2020-08-05 NOTE — Patient Instructions (Addendum)
Hipertensin en los adultos Hypertension, Adult El trmino hipertensin es otra forma de denominar a la presin arterial elevada. La presin arterial elevada fuerza al corazn a trabajar ms para bombear la sangre. Esto puede causar problemas con el paso del tiempo. Una lectura de presin arterial est compuesta por 2 nmeros. Hay un nmero superior (sistlico) sobre un nmero inferior (diastlico). Lo ideal es tener la presin arterial por debajo de 120/80. Las elecciones saludables pueden ayudar a bajar la presin arterial, o tal vez necesite medicamentos para bajarla. Cules son las causas? Se desconoce la causa de esta afeccin. Algunas afecciones pueden estar relacionadas con la presin arterial alta. Qu incrementa el riesgo?  Fumar.  Tener diabetes mellitus tipo 2, colesterol alto, o ambos.  No hacer la cantidad suficiente de actividad fsica o ejercicio.  Tener sobrepeso.  Consumir mucha grasa, azcar, caloras o sal (sodio) en su dieta.  Beber alcohol en exceso.  Tener una enfermedad renal a largo plazo (crnica).  Tener antecedentes familiares de presin arterial alta.  Edad. Los riesgos aumentan con la edad.  Raza. El riesgo es mayor para las personas afroamericanas.  Sexo. Antes de los 45aos, los hombres corren ms riesgo que las mujeres. Despus de los 65aos, las mujeres corren ms riesgo que los hombres.  Tener apnea obstructiva del sueo.  Estrs. Cules son los signos o los sntomas?  Es posible que la presin arterial alta puede no cause sntomas. La presin arterial muy alta (crisis hipertensiva) puede provocar: ? Dolor de cabeza. ? Sensaciones de preocupacin o nerviosismo (ansiedad). ? Falta de aire. ? Hemorragia nasal. ? Sensacin de malestar en el estmago (nuseas). ? Vmitos. ? Cambios en la forma de ver. ? Dolor muy intenso en el pecho. ? Convulsiones. Cmo se trata?  Esta afeccin se trata haciendo cambios saludables en el estilo de  vida, por ejemplo: ? Consumir alimentos saludables. ? Hacer ms ejercicio. ? Beber menos alcohol.  El mdico puede recetarle medicamentos si los cambios en el estilo de vida no son suficientes para lograr controlar la presin arterial y si: ? El nmero de arriba est por encima de 130. ? El nmero de abajo est por encima de 80.  Su presin arterial personal ideal puede variar. Siga estas instrucciones en su casa: Comida y bebida   Si se lo dicen, siga el plan de alimentacin de DASH (Dietary Approaches to Stop Hypertension, Maneras de alimentarse para detener la hipertensin). Para seguir este plan: ? Llene la mitad del plato de cada comida con frutas y verduras. ? Llene un cuarto del plato de cada comida con cereales integrales. Los cereales integrales incluyen pasta integral, arroz integral y pan integral. ? Coma y beba productos lcteos con bajo contenido de grasa, como leche descremada o yogur bajo en grasas. ? Llene un cuarto del plato de cada comida con protenas bajas en grasa (magras). Las protenas bajas en grasa incluyen pescado, pollo sin piel, huevos, frijoles y tofu. ? Evite consumir carne grasa, carne curada y procesada, o pollo con piel. ? Evite consumir alimentos prehechos o procesados.  Consuma menos de 1500 mg de sal por da.  No beba alcohol si: ? El mdico le indica que no lo haga. ? Est embarazada, puede estar embarazada o est tratando de quedar embarazada.  Si bebe alcohol: ? Limite la cantidad que bebe a lo siguiente:  De 0 a 1 medida por da para las mujeres.  De 0 a 2 medidas por da para los hombres. ? Est atento a   la cantidad de alcohol que hay en las bebidas que toma. En los [place], una medida equivale a una botella de cerveza de 12oz ( ), un vaso de vino de 5oz ( ) o un vaso de una bebida alcohlica de alta graduacin de 1oz (70ml). Estilo de vida   Trabaje con su mdico para mantenerse en un peso saludable o para perder  peso. Pregntele a su mdico cul es el peso recomendable para usted.  Haga al menos de ejercicio la [employer] de la [place]. Estos pueden incluir caminar, nadar o andar en bicicleta.  Realice al menos 30 minutos de ejercicio que fortalezca sus msculos (ejercicios de resistencia) al menos 3 das a la [place]. Estos pueden incluir levantar pesas o hacer Pilates.  No consuma ningn producto que contenga nicotina o tabaco, como cigarrillos, cigarrillos electrnicos y tabaco de Theatre manager. Si necesita ayuda para dejar de fumar, consulte al [employer].  Controle su presin arterial en su casa tal como le indic el mdico.  Concurra a todas las visitas de seguimiento como se lo haya indicado el mdico. Esto es importante. Medicamentos  [employer] de venta libre y los recetados solamente como se lo haya indicado el mdico. Siga cuidadosamente las indicaciones.  No omita las dosis de medicamentos para la presin arterial. Los medicamentos pierden eficacia si omite dosis. El hecho de omitir las dosis tambin [place] el riesgo de otros problemas.  Pregntele a su mdico a qu efectos secundarios o reacciones a los Museum/gallery curator. Comunquese con un mdico si:  Piensa que tiene [place] reaccin a los medicamentos que est tomando.  Tiene dolores de cabeza frecuentes (recurrentes).  Se siente mareado.  Tiene hinchazn en los tobillos.  Tiene problemas de visin. Solicite ayuda inmediatamente si:  Siente un dolor de cabeza muy intenso.  Empieza a sentirse desorientado (confundido).  Se siente dbil o adormecido.  Siente que va a desmayarse.  Tiene un dolor muy intenso en las siguientes zonas: ? Pecho. ? Vientre (abdomen).  Vomita ms de una vez.  Tiene dificultad para respirar. Resumen  El trmino hipertensin es otra forma de denominar a la presin arterial elevada.  La presin arterial elevada fuerza al corazn a trabajar ms para bombear  la sangre.  Para la [employer], una presin arterial normal es menor que 120/80.  Las decisiones saludables pueden ayudarle a disminuir su presin arterial. Si no puede bajar su presin arterial mediante decisiones saludables, es posible que deba tomar medicamentos. Esta informacin no tiene Theme park manager el consejo del mdico. Asegrese de hacerle al mdico cualquier pregunta que tenga. Document Revised: 09/18/2018 Document Reviewed: 09/18/2018 Elsevier Patient Education  2020 Elsevier Inc. Can try melatonin 5mg -15 mg at night for sleep, can also do benadryl 25-50mg  at night for sleep.  If this does not help we can try prescription medication.  Also here is some information about good sleep hygiene.   Insomnia Insomnia is frequent trouble falling and/or staying asleep. Insomnia can be a long term problem or a short term problem. Both are common. Insomnia can be a short term problem when the wakefulness is related to a certain stress or worry. Long term insomnia is often related to ongoing stress during waking hours and/or poor sleeping habits. Overtime, sleep deprivation itself can make the problem worse. Every little thing feels more severe because you are overtired and your ability to cope is decreased. CAUSES   Stress, anxiety, and depression.  Poor sleeping habits.  Distractions such as TV in the bedroom.  Naps close to bedtime.  Engaging in emotionally charged conversations before bed.  Technical reading before sleep.  Alcohol and other sedatives. They may make the problem worse. They can hurt normal sleep patterns and normal dream activity.  Stimulants such as caffeine for several hours prior to bedtime.  Pain syndromes and shortness of breath can cause insomnia.  Exercise late at night.  Changing time zones may cause sleeping problems (jet lag). It is sometimes helpful to have someone observe your sleeping patterns. They should look for periods of not  breathing during the night (sleep apnea). They should also look to see how long those periods last. If you live alone or observers are uncertain, you can also be observed at a sleep clinic where your sleep patterns will be professionally monitored. Sleep apnea requires a checkup and treatment. Give your caregivers your medical history. Give your caregivers observations your family has made about your sleep.  SYMPTOMS   Not feeling rested in the morning.  Anxiety and restlessness at bedtime.  Difficulty falling and staying asleep. TREATMENT   Your caregiver may prescribe treatment for an underlying medical disorders. Your caregiver can give advice or help if you are using alcohol or other drugs for self-medication. Treatment of underlying problems will usually eliminate insomnia problems.  Medications can be prescribed for short time use. They are generally not recommended for lengthy use.  Over-the-counter sleep medicines are not recommended for lengthy use. They can be habit forming.  You can promote easier sleeping by making lifestyle changes such as:  Using relaxation techniques that help with breathing and reduce muscle tension.  Exercising earlier in the day.  Changing your diet and the time of your last meal. No night time snacks.  Establish a regular time to go to bed.  Counseling can help with stressful problems and worry.  Soothing music and white noise may be helpful if there are background noises you cannot remove.  Stop tedious detailed work at least one hour before bedtime. HOME CARE INSTRUCTIONS   Keep a diary. Inform your caregiver about your progress. This includes any medication side effects. See your caregiver regularly. Take note of:  Times when you are asleep.  Times when you are awake during the night.  The quality of your sleep.  How you feel the next day. This information will help your caregiver care for you.  Get out of bed if you are still awake  after 15 minutes. Read or do some quiet activity. Keep the lights down. Wait until you feel sleepy and go back to bed.  Keep regular sleeping and waking hours. Avoid naps.  Exercise regularly.  Avoid distractions at bedtime. Distractions include watching television or engaging in any intense or detailed activity like attempting to balance the household checkbook.  Develop a bedtime ritual. Keep a familiar routine of bathing, brushing your teeth, climbing into bed at the same time each night, listening to soothing music. Routines increase the success of falling to sleep faster.  Use relaxation techniques. This can be using breathing and muscle tension release routines. It can also include visualizing peaceful scenes. You can also help control troubling or intruding thoughts by keeping your mind occupied with boring or repetitive thoughts like the old concept of counting sheep. You can make it more creative like imagining planting one beautiful flower after another in your backyard garden.  During your day, work to eliminate stress. When this is not possible use  some of the previous suggestions to help reduce the anxiety that accompanies stressful situations. MAKE SURE YOU:   Understand these instructions.  Will watch your condition.  Will get help right away if you are not doing well or get worse. Document Released: 11/30/2000 Document Revised: 02/25/2012 Document Reviewed: 12/31/2007 [facility] Patient Information 2015 [place], [place]. This information is not intended to replace advice given to you by your health care provider. Make sure you discuss any questions you have with your health care provider.

## 2020-08-05 NOTE — Progress Notes (Signed)
Established Patient Office Visit  Subjective:  Patient ID: Corey Graham, male    DOB: [birth year]  Age: 44 y.o. MRN: [id number]  CC:  Chief Complaint  Patient presents with  . Diabetes    HPI Corey Graham is a 44 year presents for management of type 2 diabetes he denies any signs and symptoms of hypo or hyperglycemia.  Blood pressure is elevated 152/96 Denies shortness of breath, headaches, chest pain or lower extremity edema  Past Medical History:  Diagnosis Date  . Diabetes mellitus without complication ([place]) 7846  . Essential hypertension 09/09/2017  . Low back pain 06/24/2017    Past Surgical History:  Procedure Laterality Date  . HERNIA REPAIR      History reviewed. No pertinent family history.  Social History   Socioeconomic History  . Marital status: Married    Spouse name: Not on file  . Number of children: 3  . Years of education: Not on file  . Highest education level: Not on file  Occupational History  . Not on file  Tobacco Use  . Smoking status: Never Smoker  . Smokeless tobacco: Never Used  Substance and Sexual Activity  . Alcohol use: Yes    Comment: infrequent  . Drug use: No  . Sexual activity: Yes  Other Topics Concern  . Not on file  Social History Narrative   LIves at home with wife and 3 children   Social Determinants of Health   Financial Resource Strain:   . Difficulty of Paying Living Expenses: Not on file  Food Insecurity:   . Worried About Charity fundraiser in the Last Year: Not on file  . Ran Out of Food in the Last Year: Not on file  Transportation Needs:   . Lack of Transportation (Medical): Not on file  . Lack of Transportation (Non-Medical): Not on file  Physical Activity:   . Days of Exercise per Week: Not on file  . Minutes of Exercise per Session: Not on file  Stress:   . Feeling of Stress : Not on file  Social Connections:   . Frequency of Communication with Friends and Family: Not on  file  . Frequency of Social Gatherings with Friends and Family: Not on file  . Attends Religious Services: Not on file  . Active Member of Clubs or Organizations: Not on file  . Attends Archivist Meetings: Not on file  . Marital Status: Not on file  Intimate Partner Violence:   . Fear of Current or Ex-Partner: Not on file  . Emotionally Abused: Not on file  . Physically Abused: Not on file  . Sexually Abused: Not on file    Outpatient Medications Prior to Visit  Medication Sig Dispense Refill  . AgaMatrix Ultra-Thin Lancets MISC Check blood glucose twice daily before meals 100 each 11  . amLODipine (NORVASC) 10 MG tablet Take 1 tablet (10 mg total) by mouth daily. 90 tablet 3  . Blood Glucose Monitoring Suppl (AGAMATRIX PRESTO) w/Device KIT Check sugars twice daily before meals 1 kit 0  . gabapentin (NEURONTIN) 100 MG capsule Take 1 capsule (100 mg total) by mouth 3 (three) times daily. 90 capsule 3  . glucose blood (AGAMATRIX PRESTO TEST) test strip Check blood glucose twice daily before meals 100 each 12  . ibuprofen (ADVIL) 800 MG tablet Take 1 tablet (800 mg total) by mouth every 8 (eight) hours as needed for moderate pain. 90 tablet 1  . pravastatin (PRAVACHOL)  40 MG tablet Take 1 tablet (40 mg total) by mouth daily. 90 tablet 3  . glipiZIDE (GLUCOTROL) 10 MG tablet Take 1 tab by mouth twice daily with meals 180 tablet 1  . lisinopril (ZESTRIL) 20 MG tablet Take 1 tab by mouth daily 90 tablet 1  . sitaGLIPtin-metformin (JANUMET) 50-1000 MG tablet Take 1 tablet by mouth 2 (two) times daily with a meal. 180 tablet 1   No facility-administered medications prior to visit.    No Known Allergies  ROS Review of Systems  Gastrointestinal: Positive for abdominal pain.       History of hernia repair below the surgical site on the left-hand side he has been experiencing some pain  All other systems reviewed and are negative.     Objective:    Physical Exam Vitals  reviewed.  Constitutional:      Appearance: Normal appearance. He is obese.  HENT:     Right Ear: Tympanic membrane normal.     Left Ear: Tympanic membrane normal.     Nose: Nose normal.  Eyes:     Extraocular Movements: Extraocular movements intact.  Cardiovascular:     Rate and Rhythm: Normal rate and regular rhythm.     Pulses: Normal pulses.     Heart sounds: Normal heart sounds.  Pulmonary:     Effort: Pulmonary effort is normal.     Breath sounds: Normal breath sounds.  Abdominal:     General: Bowel sounds are normal.  Musculoskeletal:        General: Normal range of motion.  Skin:    General: Skin is warm and dry.  Neurological:     Mental Status: He is alert and oriented to person, place, and time.  Psychiatric:        Mood and Affect: Mood normal.        Behavior: Behavior normal.        Thought Content: Thought content normal.     BP (!) 152/96   Pulse 93   Ht $R'5\' 4"'AN$  (1.626 m)   Wt 176 lb 6.4 oz (80 kg)   SpO2 97%   BMI 30.28 kg/m  Wt Readings from Last 3 Encounters:  08/05/20 176 lb 6.4 oz (80 kg)  05/30/20 172 lb 9.6 oz (78.3 kg)  04/29/20 169 lb (76.7 kg)     Health Maintenance Due  Topic Date Due  . Hepatitis C Screening  Never done  . HIV Screening  Never done  . OPHTHALMOLOGY EXAM  05/12/2020  . INFLUENZA VACCINE  07/17/2020    There are no preventive care reminders to display for this patient.  No results found for: TSH Lab Results  Component Value Date   WBC 8.1 04/01/2020   HGB 16.5 04/01/2020   HCT 49.0 04/01/2020   MCV 90 04/01/2020   PLT 226 04/01/2020   Lab Results  Component Value Date   NA 137 04/01/2020   K 4.8 04/01/2020   CO2 22 04/01/2020   GLUCOSE 232 (H) 04/01/2020   BUN 10 04/01/2020   CREATININE 0.71 (L) 04/01/2020   BILITOT 0.4 04/01/2020   ALKPHOS 150 (H) 04/01/2020   AST 24 04/01/2020   ALT 23 04/01/2020   PROT 7.1 04/01/2020   ALBUMIN 4.5 04/01/2020   CALCIUM 9.6 04/01/2020   Lab Results  Component  Value Date   CHOL 282 (H) 04/01/2020   Lab Results  Component Value Date   HDL 50 04/01/2020   Lab Results  Component Value Date  LDLCALC 170 (H) 04/01/2020   Lab Results  Component Value Date   TRIG 324 (H) 04/01/2020   Lab Results  Component Value Date   CHOLHDL 5.6 (H) 04/01/2020   Lab Results  Component Value Date   HGBA1C 9.3 (A) 08/05/2020      Assessment & Plan:  Corey Graham was seen today for diabetes.  Diagnoses and all orders for this visit:  Uncontrolled type 2 diabetes mellitus with hyperglycemia, without long-term current use of insulin (HCC) A1c has improved drastically within 4 months from 13.2 today 9.3.  He is taking medication daily, monitoring his diet and exercising as much as possible to also include working.  Continue current medications agree that as long as A1c is decreasing we will not start insulin. -     POCT glucose (manual entry) -     POCT glycosylated hemoglobin (Hb A1C) -     sitaGLIPtin-metformin (JANUMET) 50-1000 MG tablet; Take 1 tablet by mouth 2 (two) times daily with a meal. -     glipiZIDE (GLUCOTROL) 10 MG tablet; Take 1 tab by mouth twice daily with meals  Essential hypertension Counseled on blood pressure goal of less than 130/80, low-sodium, DASH diet, medication compliance, 150 minutes of moderate intensity exercise per week. Increased lisinopril 40mg  daily and continue amlodipine 10 mg daily  Other orders -     lisinopril (ZESTRIL) 40 MG tablet; Take 1 tab by mouth daily    Follow-up: Return in about 3 months (around 11/05/2020) for labs fasting/ DM, HTN.    Kerin Perna, NP

## 2020-08-15 ENCOUNTER — Encounter (INDEPENDENT_AMBULATORY_CARE_PROVIDER_SITE_OTHER): Payer: Self-pay

## 2020-08-15 ENCOUNTER — Other Ambulatory Visit (INDEPENDENT_AMBULATORY_CARE_PROVIDER_SITE_OTHER): Payer: Self-pay

## 2020-08-15 ENCOUNTER — Other Ambulatory Visit (INDEPENDENT_AMBULATORY_CARE_PROVIDER_SITE_OTHER): Payer: Self-pay | Admitting: Family Medicine

## 2020-08-15 ENCOUNTER — Other Ambulatory Visit: Payer: Self-pay

## 2020-08-16 ENCOUNTER — Other Ambulatory Visit (INDEPENDENT_AMBULATORY_CARE_PROVIDER_SITE_OTHER): Payer: Self-pay

## 2020-08-16 DIAGNOSIS — E1165 Type 2 diabetes mellitus with hyperglycemia: Secondary | ICD-10-CM

## 2020-08-16 DIAGNOSIS — I10 Essential (primary) hypertension: Secondary | ICD-10-CM

## 2020-08-17 ENCOUNTER — Other Ambulatory Visit (INDEPENDENT_AMBULATORY_CARE_PROVIDER_SITE_OTHER): Payer: Self-pay | Admitting: Primary Care

## 2020-08-17 DIAGNOSIS — E782 Mixed hyperlipidemia: Secondary | ICD-10-CM

## 2020-08-17 LAB — CMP14+EGFR
ALT: 28 IU/L (ref 0–44)
AST: 23 IU/L (ref 0–40)
Albumin/Globulin Ratio: 1.6 (ref 1.2–2.2)
Albumin: 4.2 g/dL (ref 4.0–5.0)
Alkaline Phosphatase: 119 IU/L (ref 48–121)
BUN/Creatinine Ratio: 19 (ref 9–20)
BUN: 12 mg/dL (ref 6–24)
Bilirubin Total: <0.2 mg/dL (ref 0.0–1.2)
CO2: 20 mmol/L (ref 20–29)
Calcium: 8.9 mg/dL (ref 8.7–10.2)
Chloride: 100 mmol/L (ref 96–106)
Creatinine, Ser: 0.64 mg/dL — ABNORMAL LOW (ref 0.76–1.27)
GFR calc Af Amer: 139 mL/min/{1.73_m2} (ref >59)
GFR calc non Af Amer: 120 mL/min/{1.73_m2} (ref >59)
Globulin, Total: 2.7 g/dL (ref 1.5–4.5)
Glucose: 196 mg/dL — ABNORMAL HIGH (ref 65–99)
Potassium: 4.2 mmol/L (ref 3.5–5.2)
Sodium: 135 mmol/L (ref 134–144)
Total Protein: 6.9 g/dL (ref 6.0–8.5)

## 2020-08-17 LAB — CBC WITH DIFFERENTIAL/PLATELET
Basophils Absolute: 0.1 10*3/uL (ref 0.0–0.2)
Basos: 1 %
EOS (ABSOLUTE): 0.4 10*3/uL (ref 0.0–0.4)
Eos: 5 %
Hematocrit: 42.9 % (ref 37.5–51.0)
Hemoglobin: 14.7 g/dL (ref 13.0–17.7)
Immature Grans (Abs): 0 10*3/uL (ref 0.0–0.1)
Immature Granulocytes: 1 %
Lymphocytes Absolute: 3 10*3/uL (ref 0.7–3.1)
Lymphs: 40 %
MCH: 30.9 pg (ref 26.6–33.0)
MCHC: 34.3 g/dL (ref 31.5–35.7)
MCV: 90 fL (ref 79–97)
Monocytes Absolute: 0.4 10*3/uL (ref 0.1–0.9)
Monocytes: 6 %
Neutrophils Absolute: 3.7 10*3/uL (ref 1.4–7.0)
Neutrophils: 47 %
Platelets: 219 10*3/uL (ref 150–450)
RBC: 4.75 x10E6/uL (ref 4.14–5.80)
RDW: 12.6 % (ref 11.6–15.4)
WBC: 7.7 10*3/uL (ref 3.4–10.8)

## 2020-08-17 LAB — LIPID PANEL
Chol/HDL Ratio: 8.7 ratio — ABNORMAL HIGH (ref 0.0–5.0)
Cholesterol, Total: 278 mg/dL — ABNORMAL HIGH (ref 100–199)
HDL: 32 mg/dL — ABNORMAL LOW (ref >39)
Triglycerides: 1104 mg/dL (ref 0–149)

## 2020-08-17 MED ORDER — EZETIMIBE 10 MG PO TABS: 10.0000 mg | ORAL_TABLET | Freq: Every day | ORAL | 3 refills | Status: DC

## 2020-08-17 MED ORDER — PRAVASTATIN SODIUM 80 MG PO TABS: 80.0000 mg | ORAL_TABLET | Freq: Every day | ORAL | 1 refills | Status: DC

## 2020-08-17 MED FILL — PRAVASTATIN SODIUM 80 MG TA: 80 | 30 days supply | Qty: 30 | Fill #0

## 2020-08-17 MED FILL — EZETIMIBE 10 MG TABS: 10 | 30 days supply | Qty: 30 | Fill #0

## 2020-08-17 NOTE — Progress Notes (Signed)
Called pt as per NP instrctions. Made pt aware of NP note and instructions / verbalized understanding   "Please let patient know Cholesterol level is elevated  this can lead to heart attack and stroke. To lower your number you can decrease your fatty foods, red meat, cheese, milk and increase fiber like whole grains and veggies. Increased Pravastatin 80mg  and added   Labs are essentially normal. There are some minor variations in your blood work that do not require any additional work up at this time. Will continue to monitor. Make sure you are drinking at least 48 oz of water per day. Work on eating a low fat, heart healthy diet and participate in regular aerobic exercise program to control as well. Exercise at least  30 minutes per day-5 days per week. Avoid red meat. No fried foods. No junk foods, sodas, sugary foods or drinks, unhealthy snacking, alcohol or smoking."

## 2020-08-18 MED FILL — !JANUMET 50-1000MG TABLET: 50-1000 | 30 days supply | Qty: 60 | Fill #0

## 2020-08-18 MED FILL — ?GLIPIZIDE 10 MG TABLET: 10 | 30 days supply | Qty: 60 | Fill #0

## 2020-08-18 MED FILL — LISINOPRIL 40 MG TABLET: 40 | 90 days supply | Qty: 90 | Fill #0

## 2020-10-04 ENCOUNTER — Other Ambulatory Visit (INDEPENDENT_AMBULATORY_CARE_PROVIDER_SITE_OTHER): Payer: Self-pay | Admitting: Primary Care

## 2020-10-04 MED FILL — EZETIMIBE 10 MG TABS: 10 | 30 days supply | Qty: 30 | Fill #1

## 2020-10-04 MED FILL — IBUPROFEN 800 MG TABLET: 800 | 30 days supply | Qty: 90 | Fill #0

## 2020-10-04 MED FILL — PRAVASTATIN SODIUM 80 MG TA: 80 | 30 days supply | Qty: 30 | Fill #1

## 2020-10-14 MED FILL — PRAVASTATIN SODIUM 80 MG TA: 80 | 30 days supply | Qty: 30 | Fill #1

## 2020-10-14 MED FILL — EZETIMIBE 10 MG TABS: 10 | 30 days supply | Qty: 30 | Fill #1

## 2020-10-21 ENCOUNTER — Telehealth (INDEPENDENT_AMBULATORY_CARE_PROVIDER_SITE_OTHER): Payer: Self-pay

## 2020-10-21 NOTE — Telephone Encounter (Signed)
Patient came into requesting an ophthalmology referral. Patient state he is having vision disturbance.  Please advice [phone]

## 2020-10-21 NOTE — Telephone Encounter (Signed)
Open in error

## 2020-10-28 ENCOUNTER — Ambulatory Visit: Payer: Self-pay

## 2020-11-04 ENCOUNTER — Ambulatory Visit (INDEPENDENT_AMBULATORY_CARE_PROVIDER_SITE_OTHER): Payer: Self-pay | Admitting: Primary Care

## 2020-11-09 ENCOUNTER — Other Ambulatory Visit: Payer: Self-pay

## 2020-11-09 ENCOUNTER — Ambulatory Visit (INDEPENDENT_AMBULATORY_CARE_PROVIDER_SITE_OTHER): Payer: Self-pay | Admitting: Primary Care

## 2020-11-09 ENCOUNTER — Other Ambulatory Visit (INDEPENDENT_AMBULATORY_CARE_PROVIDER_SITE_OTHER): Payer: Self-pay | Admitting: Primary Care

## 2020-11-09 DIAGNOSIS — R0602 Shortness of breath: Secondary | ICD-10-CM

## 2020-11-09 DIAGNOSIS — E1165 Type 2 diabetes mellitus with hyperglycemia: Secondary | ICD-10-CM

## 2020-11-09 DIAGNOSIS — I10 Essential (primary) hypertension: Secondary | ICD-10-CM

## 2020-11-09 DIAGNOSIS — R059 Cough, unspecified: Secondary | ICD-10-CM

## 2020-11-09 LAB — POCT GLYCOSYLATED HEMOGLOBIN (HGB A1C): HbA1c, POC (controlled diabetic range): 10.3 % — AB (ref 0.0–7.0)

## 2020-11-09 LAB — GLUCOSE, POCT (MANUAL RESULT ENTRY): POC Glucose: 183 mg/dl — AB (ref 70–99)

## 2020-11-09 MED ORDER — JANUMET 50-1000 MG PO TABS: 1.0000 | ORAL_TABLET | Freq: Two times a day (BID) | ORAL | 1 refills | Status: DC

## 2020-11-09 MED ORDER — GABAPENTIN 100 MG PO CAPS: 100.0000 mg | ORAL_CAPSULE | Freq: Three times a day (TID) | ORAL | 3 refills | Status: DC

## 2020-11-09 MED ORDER — LISINOPRIL 40 MG PO TABS: ORAL_TABLET | ORAL | 1 refills | Status: DC

## 2020-11-09 MED ORDER — AMLODIPINE BESYLATE 10 MG PO TABS: 10.0000 mg | ORAL_TABLET | Freq: Every day | ORAL | 3 refills | Status: DC

## 2020-11-09 MED ORDER — LANTUS SOLOSTAR 100 UNIT/ML ~~LOC~~ SOPN: 12.0000 [IU] | PEN_INJECTOR | Freq: Every day | SUBCUTANEOUS | 99 refills | Status: DC

## 2020-11-09 MED ORDER — HYDROCHLOROTHIAZIDE 25 MG PO TABS: 25.0000 mg | ORAL_TABLET | Freq: Every day | ORAL | 3 refills | Status: DC

## 2020-11-09 MED ORDER — GUAIFENESIN 100 MG/5ML PO SOLN: 5.0000 mL | ORAL | 0 refills | Status: DC | PRN

## 2020-11-09 MED ORDER — GLIPIZIDE 10 MG PO TABS: ORAL_TABLET | ORAL | 1 refills | Status: DC

## 2020-11-09 MED ORDER — ALBUTEROL SULFATE HFA 108 (90 BASE) MCG/ACT IN AERS: 2.0000 | INHALATION_SPRAY | Freq: Four times a day (QID) | RESPIRATORY_TRACT | 1 refills | Status: DC | PRN

## 2020-11-09 MED FILL — ?GLIPIZIDE 10 MG TABLET: 10 | 30 days supply | Qty: 60 | Fill #0

## 2020-11-09 MED FILL — GABAPENTIN 100 MG CAPSULE: 100 | 30 days supply | Qty: 90 | Fill #0

## 2020-11-09 MED FILL — ?BASAGLAR 100 UNITS/ML KWPE: 100 | 25 days supply | Qty: 3 | Fill #0

## 2020-11-09 MED FILL — ALBUTEROL SULFATE HFA 108 (: 108 (90 BAS | 25 days supply | Qty: 18 | Fill #0

## 2020-11-09 MED FILL — AMLODIPINE BESYLATE 10 MG T: 10 | 90 days supply | Qty: 90 | Fill #0

## 2020-11-09 MED FILL — !JANUMET 50-1000MG TABLET: 50-1000 | 30 days supply | Qty: 60 | Fill #0

## 2020-11-09 MED FILL — HYDROCHLOROTHIAZIDE 25 MG T: 25 | 90 days supply | Qty: 90 | Fill #0

## 2020-11-09 MED FILL — LISINOPRIL 40 MG TABLET: 40 | 90 days supply | Qty: 90 | Fill #0

## 2020-11-09 NOTE — Progress Notes (Signed)
DM and HTN Took medication today.

## 2020-11-09 NOTE — Patient Instructions (Addendum)
Insomnio Insomnia El insomnio es un trastorno del sueo que causa dificultades para conciliar el sueo o para [place]. Puede producir fatiga, falta de energa, dificultad para concentrarse, cambios en el estado de nimo y mal rendimiento escolar o laboral. Hay tres formas diferentes de clasificar el insomnio:  Dificultad para conciliar el sueo.  Dificultad para mantener el sueo.  Despertar muy precoz por la maana. Cualquier tipo de insomnio puede ser a Barrister's clerk (crnico) o a Control and instrumentation engineer (agudo). Ambos son frecuentes. Generalmente, el insomnio a corto plazo dura tres meses o menos tiempo. El crnico ocurre al menos tres veces por semana durante ms de tres meses. Cules son las causas? El insomnio puede deberse a otra afeccin, situacin o sustancia, por ejemplo:  Ansiedad.  Ciertos medicamentos.  Enfermedad de reflujo gastroesofgico (ERGE) u otras enfermedades gastrointestinales.  Asma y otras enfermedades respiratorias.  Sndrome de las piernas inquietas, apnea del sueo u otros trastornos del sueo.  Dolor crnico.  Menopausia.  Accidente cerebrovascular.  Consumo excesivo de alcohol, tabaco u drogas ilegales.  Afecciones de salud mental, como depresin.  Cafena.  Trastornos neurolgicos, como enfermedad de Alzheimer.  Hiperactividad de la glndula tiroidea (hipertiroidismo). En ocasiones, la causa del insomnio puede ser desconocida. Qu incrementa el riesgo? Los factores de riesgo de tener insomnio incluyen lo siguiente:  Sexo. La enfermedad afecta ms a menudo a las mujeres que a los hombres.  Edad. El insomnio es ms frecuente a medida que una persona envejece.  Estrs.  La falta de actividad fsica.  Los horarios de trabajo irregulares o los turnos nocturnos.  Los viajes a lugares de diferentes zonas horarias.  Ciertas afecciones mdicas y de salud mental. Cules son los signos o los sntomas? Si tiene insomnio, el sntoma principal es la  dificultad para conciliar el sueo o mantenerlo. Esto puede derivar en otros sntomas, por ejemplo:  Sentirse fatigado o tener poca energa.  Ponerse nervioso por [employer] irse a dormir.  No sentirse descansado por la maana.  Tener dificultad para concentrarse.  Sentirse irritable, ansioso o deprimido. Cmo se diagnostica? Esta afeccin se puede diagnosticar en funcin de lo siguiente:  Los sntomas y antecedentes mdicos. El mdico puede hacerle preguntas sobre: ? Hbitos de sueo. ? Cualquier afeccin mdica que tenga. ? La salud mental.  Un examen fsico. Cmo se trata? El tratamiento para el insomnio depende de la causa. El tratamiento puede centrarse en tratar Ardelia Mems afeccin preexistente que causa el insomnio. El tratamiento tambin puede incluir lo siguiente:  Medicamentos que lo ayuden a dormir.  Asesoramiento psicolgico o terapia.  Ajustes en el estilo de vida para ayudarlo a dormir mejor. Siga estas indicaciones en su casa: Comida y bebida   Limite o evite el consumo de alcohol, bebidas con cafena y cigarrillos, especialmente cerca de la hora de [place], ya que pueden perturbarle el sueo.  No consuma una comida suculenta ni coma alimentos condimentados justo antes de la hora de [place]. Esto puede causarle molestias digestivas y dificultades para dormir. Hbitos de sueo   Lleve un registro del sueo ya que podra ser de utilidad para que usted y a su mdico puedan determinar qu podra estar causndole insomnio. Escriba los siguientes datos: ? Cundo duerme. ? Cundo se despierta durante la noche. ? Qu tan bien duerme. ? Qu tan relajado se siente al da siguiente. ? Cualquier efecto secundario de los [employer] toma. ? Lo que usted come y bebe.  Convierta su habitacin en un lugar oscuro, cmodo donde sea fcil  conciliar el sueo. ? Coloque persianas o cortinas oscuras que impidan la entrada de la luz del exterior. ? Para bloquear los ruidos,  use un aparato que reproduzca sonidos ambientales o relajantes de fondo. ? Mantenga baja la temperatura.  Limite el uso de pantallas antes de la hora de [place]. Esto incluye lo siguiente: ? Mirar televisin. ? Usar el telfono inteligente, la tableta o la computadora.  Siga una rutina que incluya ir a dormir y Chiropodist a la misma hora cada da y noche. Esto puede ayudarlo a conciliar el sueo ms rpidamente. Considere realizar [employer] tranquila, como leer, e incorporarla como parte de la rutina a la hora de irse a dormir.  Trate de evitar tomar siestas durante el da para que pueda dormir mejor por la noche.  Levntese de la cama si sigue despierto despus de de haber intentado dormirse. Mantenga bajas las luces, pero intente leer o hacer una actividad tranquila. Cuando tenga sueo, regrese a Pharmacist, hospital. Instrucciones generales  [employer] de venta libre y los recetados solamente como se lo haya indicado el mdico.  Realice ejercicio con regularidad como se lo haya indicado el mdico. Evite la actividad fsica desde varias horas antes de irse a dormir.  Utilice tcnicas de relajacin para controlar el estrs. Pdale al mdico que le sugiera algunas tcnicas que sean adecuadas para usted. Estos pueden incluir lo siguiente: ? Ejercicios de respiracin. ? Rutinas para aliviar la tensin muscular. ? Visualizacin de escenas apacibles.  Conduzca con cuidado. No conduzca si est muy somnoliento.  Concurra a todas las visitas de control como se lo haya indicado el mdico. Esto es importante. Comunquese con un mdico si:  Est cansado durante todo Medical laboratory scientific officer.  Tiene dificultad en su rutina diaria debido a la somnolencia.  Sigue teniendo problemas para dormir o Teacher, English as a foreign language. Solicite ayuda de inmediato si:  Piensa seriamente en lastimarse a usted mismo o a Engineer, maintenance (IT). Si alguna vez siente que puede lastimarse a usted mismo o a Economist, o tiene  pensamientos de poner fin a su vida, busque ayuda de inmediato. Puede dirigirse al servicio de emergencias ms cercano o comunicarse con:  El servicio de emergencias de su localidad (911 en [place]  Una lnea de asistencia al suicida y Visual merchandiser en crisis, como la [employer] de Prevencin del Suicidio (National Suicide Prevention Lifeline), al [phone]. Est disponible las 24 horas del da. Resumen  El insomnio es un trastorno del sueo que causa dificultades para conciliar el sueo o para [place].  El insomnio puede ser a Air cabin crew (crnico) o a Product manager (agudo).  El tratamiento para el insomnio depende de la causa. El tratamiento puede centrarse en tratar Neomia Dear afeccin preexistente que causa el insomnio.  Lleve un registro del sueo ya que podra ser de utilidad para que usted y a su mdico puedan determinar qu podra estar causndole insomnio. Esta informacin no tiene Theme park manager el consejo del mdico. Asegrese de hacerle al mdico cualquier pregunta que tenga. Document Revised: 03/15/2018 Document Reviewed: 11/22/2017 Elsevier Patient Education  2020 [employer]. Diabetes mellitus y nutricin, en adultos Diabetes Mellitus and Nutrition, Adult Si sufre de diabetes (diabetes mellitus), es muy importante tener hbitos alimenticios saludables debido a que sus niveles de Psychologist, counselling sangre (glucosa) se ven afectados en gran medida por lo que come y bebe. [place] alimentos saludables en las cantidades [place], aproximadamente a la [employer], [place] ayudar a:  Scientist, physiological glucemia.  Disminuir el riesgo de sufrir una enfermedad cardaca.  Mejorar la presin arterial.  Barista o mantener un peso saludable. Todas las personas que sufren de diabetes son diferentes y cada una tiene necesidades diferentes en cuanto a un plan de alimentacin. El mdico puede recomendarle que trabaje con un especialista en dietas y nutricin (nutricionista) para  Tax adviser plan para usted. Su plan de alimentacin puede variar segn factores como:  Las caloras que necesita.  Los medicamentos que toma.  Su peso.  Sus niveles de glucemia, presin arterial y colesterol.  Su nivel de [place].  Otras afecciones que tenga, como enfermedades cardacas o renales. Cmo me afectan los carbohidratos? Los carbohidratos, o hidratos de carbono, afectan su nivel de glucemia ms que cualquier otro tipo de alimento. La ingesta de carbohidratos naturalmente aumenta la cantidad de [employer]. El recuento de carbohidratos es un mtodo destinado a Midwife un registro de la cantidad de carbohidratos que se consumen. El recuento de carbohidratos es importante para Pharmacologist la glucemia a un nivel saludable, especialmente si utiliza insulina o toma determinados medicamentos por va oral para la diabetes. Es importante conocer la cantidad de carbohidratos que se pueden ingerir en cada comida sin correr Surveyor, minerals. Esto es Government social research officer. Su nutricionista puede ayudarlo a calcular la cantidad de carbohidratos que debe ingerir en cada comida y en cada refrigerio. Entre los alimentos que contienen carbohidratos, se incluyen:  Pan, cereal, arroz, pastas y galletas.  Papas y maz.  Guisantes, frijoles y lentejas.  Leche y Dentist.  Nils Pyle y [place].  Postres, como pasteles, galletas, helado y caramelos. Cmo me afecta el alcohol? El alcohol puede provocar disminuciones sbitas de la glucemia (hipoglucemia), especialmente si utiliza insulina o toma determinados medicamentos por va oral para la diabetes. La hipoglucemia es una afeccin potencialmente mortal. Los sntomas de la hipoglucemia (somnolencia, mareos y confusin) son similares a los sntomas de haber consumido demasiado alcohol. Si el mdico afirma que el alcohol es seguro para usted, [place] estas pautas:  Limite el consumo de alcohol a no ms de por da si es mujer y no est  [place], y a si es hombre. Una medida equivale a 12oz ( ) de cerveza, 5oz ( ) de vino o 1oz (64ml) de bebidas alcohlicas de alta graduacin.  No beba con el estmago vaco.  Mantngase hidratado bebiendo agua, refrescos dietticos o t helado sin azcar.  Tenga en cuenta que los refrescos comunes, los jugos y otras bebida para Engineer, manufacturing pueden contener mucha azcar y se deben contar como carbohidratos. Cules son algunos consejos para seguir este plan?  Leer las etiquetas de los alimentos  Comience por leer el tamao de la porcin en la "Informacin nutricional" en las etiquetas de los alimentos envasados y las bebidas. La cantidad de caloras, carbohidratos, grasas y otros nutrientes mencionados en la etiqueta se basan en una porcin del alimento. Muchos alimentos contienen ms de una porcin por envase.  Verifique la cantidad total de gramos (g) de carbohidratos totales en una porcin. Puede calcular la cantidad de porciones de carbohidratos al dividir el total de carbohidratos por 15. Por ejemplo, si un alimento tiene un total de 30g de carbohidratos, equivale a 2 porciones de carbohidratos.  Verifique la cantidad de gramos (g) de grasas saturadas y grasas trans en una porcin. Escoja alimentos que no contengan grasa o que tengan un bajo contenido.  Verifique la cantidad de miligramos (mg) de sal (sodio) en una porcin. [employer]  deben limitar la ingesta de sodio total a menos de 2300mg  por da.  Siempre consulte la informacin nutricional de los alimentos etiquetados como "con bajo contenido de grasa" o "sin grasa". Estos alimentos pueden tener un mayor contenido de International aid/development workerazcar agregada o carbohidratos refinados, y deben evitarse.  Hable con su nutricionista para identificar sus objetivos diarios en cuanto a los nutrientes mencionados en la etiqueta. Al ir de compras  Evite comprar alimentos procesados, enlatados o precocinados. Estos alimentos  tienden a Counselling psychologisttener una mayor cantidad de [place]wegrasa, sodio y azcar agregada.  Compre en la zona exterior de la tienda de comestibles. Esta zona incluye frutas y verduras frescas, granos a granel, carnes frescas y productos lcteos frescos. Al cocinar  Utilice mtodos de coccin a baja temperatura, como hornear, en lugar de mtodos de coccin a alta temperatura, como frer en abundante aceite.  Cocine con aceites saludables, como el aceite de [place], canola o [place]girasol.  Evite cocinar con manteca, crema o carnes con alto contenido de grasa. Planificacin de las comidas  Coma las comidas y los refrigerios regularmente, preferentemente a la misma hora todos Steinhatcheelos das. Evite pasar largos perodos de tiempo sin comer.  Consuma alimentos ricos en fibra, como frutas frescas, verduras, frijoles y cereales integrales. Consulte a su nutricionista sobre cuntas porciones de carbohidratos puede consumir en cada comida.  Consuma entre 4 y 6 onzas (oz) de protenas magras por da, como carnes [place] Greenmagras, pollo, pescado, huevos o tofu. Una onza de protena magra equivale a: ? 1 onza de carne, pollo o pescado. ? 1huevo. ?  taza de tofu.  Coma algunos alimentos por da que contengan grasas saludables, como aguacates, frutos secos, semillas y pescado. Estilo de vida  Controle su nivel de glucemia con regularidad.  Haga actividad fsica habitualmente como se lo haya indicado el mdico. Esto puede incluir lo siguiente: ? 150minutos semanales de ejercicio de intensidad moderada o alta. Esto podra incluir caminatas dinmicas, ciclismo o gimnasia acutica. ? Realizar ejercicios de elongacin y de fortalecimiento, como yoga o levantamiento de pesas, por lo menos 2veces por semana.  Tome los [employer] Companymedicamentos como se lo haya indicado el mdico.  No consuma ningn producto que contenga nicotina o tabaco, como cigarrillos y Administrator, Civil Servicecigarrillos electrnicos. Si necesita ayuda para dejar de fumar, consulte al CIGNAmdico.  Trabaje con un  asesor o instructor en diabetes para identificar estrategias para controlar el estrs y cualquier desafo emocional y social. Preguntas para hacerle al mdico  Es necesario que consulte a IT trainerun instructor en el cuidado de la diabetes?  Es necesario que me rena con un nutricionista?  A qu nmero puedo llamar si tengo preguntas?  Cules son los mejores momentos para controlar la glucemia? Dnde encontrar ms informacin:  Asociacin Estadounidense de la Diabetes (American Diabetes Association): diabetes.org  Academia de Nutricin y Pension scheme managerDiettica (Academy of Nutrition and Dietetics): www.eatright.org  The Krogernstituto Nacional de la Diabetes y las Enfermedades Digestivas y [facility](National Institute of Diabetes and Digestive and Kidney Diseases, NIH): CarFlippers.tnwww.niddk.nih.gov Resumen  Un plan de alimentacin saludable lo ayudar a Scientist, physiologicalcontrolar la glucemia y Pharmacologistmantener un estilo de vida saludable.  Trabajar con un especialista en dietas y nutricin (nutricionista) puede ayudarlo a Designer, television/film setelaborar el mejor plan de alimentacin para usted.  Tenga en cuenta que los carbohidratos (hidratos de carbono) y el alcohol tienen efectos inmediatos en sus niveles de glucemia. Es importante contar los carbohidratos que ingiere y consumir alcohol con prudencia. Esta informacin no tiene Theme park managercomo fin reemplazar el consejo del mdico. Asegrese de hacerle al  mdico cualquier pregunta que tenga. Document Revised: 08/13/2017 Document Reviewed: 03/25/2017 Elsevier Patient Education  2020 [employer].

## 2020-11-09 NOTE — Progress Notes (Signed)
[place]  Established Patient Office Visit  Subjective:  Patient ID: Corey Graham, male    DOB: 1976-02-10  Age: 44 y.o. MRN: [id number]  CC:  Chief Complaint  Patient presents with  . Diabetes  . Hypertension    HPI Corey Graham is a 44 year old Hispanic male who  presents for the management of type 2 diabetes ypoglycemic episodes:no, Polydipsia/polyuria: no, Visual disturbance: no, Chest pain: no, Paresthesias: yes and Glucose Monitoring: no Denies shortness of breath, headaches, chest pain or lower extremity edema. who presents with cough for the past week . Denies/ Admits positive sick exposure or precipitating event.  Describes cough as constant/ intermittent and dry/ productive with white/yellow sputum.  Has tried nothing Denies fever, chills, fatigue, sinus pain, rhinorrhea, sore throat, chest pain, nausea, changes in bowel or bladder habits. Admits to SOB, wheezing,  Past Medical History:  Diagnosis Date  . Diabetes mellitus without complication ([place]) 1308  . Essential hypertension 09/09/2017  . Low back pain 06/24/2017    Past Surgical History:  Procedure Laterality Date  . HERNIA REPAIR      No family history on file.  Social History   Socioeconomic History  . Marital status: Married    Spouse name: Not on file  . Number of children: 3  . Years of education: Not on file  . Highest education level: Not on file  Occupational History  . Not on file  Tobacco Use  . Smoking status: Never Smoker  . Smokeless tobacco: Never Used  Substance and Sexual Activity  . Alcohol use: Yes    Comment: infrequent  . Drug use: No  . Sexual activity: Yes  Other Topics Concern  . Not on file  Social History Narrative   LIves at home with wife and 3 children   Social Determinants of Health   Financial Resource Strain:   . Difficulty of Paying Living Expenses: Not on file  Food Insecurity:   . Worried About Sales executive in the Last Year: Not on file  . Ran Out of Food in the Last Year: Not on file  Transportation Needs:   . Lack of Transportation (Medical): Not on file  . Lack of Transportation (Non-Medical): Not on file  Physical Activity:   . Days of Exercise per Week: Not on file  . Minutes of Exercise per Session: Not on file  Stress:   . Feeling of Stress : Not on file  Social Connections:   . Frequency of Communication with Friends and Family: Not on file  . Frequency of Social Gatherings with Friends and Family: Not on file  . Attends Religious Services: Not on file  . Active Member of Clubs or Organizations: Not on file  . Attends Archivist Meetings: Not on file  . Marital Status: Not on file  Intimate Partner Violence:   . Fear of Current or Ex-Partner: Not on file  . Emotionally Abused: Not on file  . Physically Abused: Not on file  . Sexually Abused: Not on file    Outpatient Medications Prior to Visit  Medication Sig Dispense Refill  . ezetimibe (ZETIA) 10 MG tablet Take 1 tablet (10 mg total) by mouth daily. 90 tablet 3  . pravastatin (PRAVACHOL) 80 MG tablet Take 1 tablet (80 mg total) by mouth daily. 90 tablet 1  . amLODipine (NORVASC) 10 MG tablet Take 1 tablet (10 mg total) by mouth daily. 90 tablet 3  . gabapentin (  NEURONTIN) 100 MG capsule Take 1 capsule (100 mg total) by mouth 3 (three) times daily. 90 capsule 3  . glipiZIDE (GLUCOTROL) 10 MG tablet Take 1 tab by mouth twice daily with meals 180 tablet 1  . lisinopril (ZESTRIL) 40 MG tablet Take 1 tab by mouth daily 90 tablet 1  . sitaGLIPtin-metformin (JANUMET) 50-1000 MG tablet Take 1 tablet by mouth 2 (two) times daily with a meal. 180 tablet 1  . AgaMatrix Ultra-Thin Lancets MISC Check blood glucose twice daily before meals 100 each 11  . Blood Glucose Monitoring Suppl (AGAMATRIX PRESTO) w/Device KIT Check sugars twice daily before meals 1 kit 0  . glucose blood (AGAMATRIX PRESTO TEST) test strip Check blood  glucose twice daily before meals 100 each 12  . ibuprofen (ADVIL) 800 MG tablet TAKE 1 TABLET (800 MG TOTAL) BY MOUTH EVERY 8 (EIGHT) HOURS AS NEEDED FOR MODERATE PAIN. (Patient not taking: Reported on 11/09/2020) 90 tablet 1   No facility-administered medications prior to visit.    No Known Allergies  ROS Review of Systems  All other systems reviewed and are negative.     Objective:    There were no vitals taken for this visit. Physical Exam There were no vitals filed for this visit. General: Vital signs reviewed.  Patient is well-developed and well-nourished, in no acute distress and cooperative with exam.  Head: Normocephalic and atraumatic. Eyes: EOMI, conjunctivae normal, no scleral icterus.  Neck: Supple, trachea midline, normal ROM, no JVD, masses, thyromegaly, or carotid bruit present.  Cardiovascular: RRR, S1 normal, S2 normal, no murmurs, gallops, or rubs. Pulmonary/Chest: Clear to auscultation bilaterally, no wheezes, rales, or rhonchi. Abdominal: Soft, non-tender, non-distended, BS +, no masses, organomegaly, or guarding present.  Musculoskeletal: No joint deformities, erythema, or stiffness, ROM full and nontender. Extremities: No lower extremity edema bilaterally,  pulses symmetric and intact bilaterally. No cyanosis or clubbing. Neurological: A&O x3, Strength is normal and symmetric bilaterally, cranial nerve II-XII are grossly intact, no focal motor deficit, sensory intact to light touch bilaterally.  Skin: Warm, dry and intact. No rashes or erythema. Psychiatric: Normal mood and affect. speech and behavior is normal. Cognition and memory are normal.   There were no vitals taken for this visit. Wt Readings from Last 3 Encounters:  08/05/20 176 lb 6.4 oz (80 kg)  05/30/20 172 lb 9.6 oz (78.3 kg)  04/29/20 169 lb (76.7 kg)   Health Maintenance Due  Topic Date Due  . OPHTHALMOLOGY EXAM  Never done  . HIV Screening  Never done  . INFLUENZA VACCINE  07/17/2020     There are no preventive care reminders to display for this patient.  No results found for: TSH Lab Results  Component Value Date   WBC 7.7 08/16/2020   HGB 14.7 08/16/2020   HCT 42.9 08/16/2020   MCV 90 08/16/2020   PLT 219 08/16/2020   Lab Results  Component Value Date   NA 135 08/16/2020   K 4.2 08/16/2020   CO2 20 08/16/2020   GLUCOSE 196 (H) 08/16/2020   BUN 12 08/16/2020   CREATININE 0.64 (L) 08/16/2020   BILITOT <0.2 08/16/2020   ALKPHOS 119 08/16/2020   AST 23 08/16/2020   ALT 28 08/16/2020   PROT 6.9 08/16/2020   ALBUMIN 4.2 08/16/2020   CALCIUM 8.9 08/16/2020   Lab Results  Component Value Date   CHOL 278 (H) 08/16/2020   Lab Results  Component Value Date   HDL 32 (L) 08/16/2020   Lab Results  Component Value Date   LDLCALC Comment (A) 08/16/2020   Lab Results  Component Value Date   TRIG 1,104 ([place]) 08/16/2020   Lab Results  Component Value Date   CHOLHDL 8.7 (H) 08/16/2020   Lab Results  Component Value Date   HGBA1C 10.3 (A) 11/09/2020      Assessment & Plan:  Corey Graham was seen today for diabetes and hypertension.  Diagnoses and all orders for this visit:  Essential hypertension Counseled on blood pressure goal of less than 130/80, low-sodium, DASH diet, medication compliance, 150 minutes of moderate intensity exercise per week. Discussed medication compliance, adverse effects. -     hydrochlorothiazide (HYDRODIURIL) 25 MG tablet; Take 1 tablet (25 mg total) by mouth daily. -     lisinopril (ZESTRIL) 40 MG tablet; Take 1 tab by mouth daily -     amLODipine (NORVASC) 10 MG tablet; Take 1 tablet (10 mg total) by mouth daily.  Uncontrolled type 2 diabetes mellitus with hyperglycemia, without long-term current use of insulin (HCC)  A1C increased from 9.3- 3 months ago however 7 months prior 13.2 waxing /weaning . Explain f/u if no improvement he would be placed on insulin. Discussed  co- morbidities with uncontrol diabetes  Complications  -diabetic retinopathy, (close your eyes ? What do you see nothing) nephropathy decrease in kidney function- can lead to dialysis-on a machine 3 days a week to filter your kidney, neuropathy- numbness and tinging in your hands and feet,  increase risk of heart attack and stroke, and amputation due to decrease wound healing and circulation. Decrease your risk by taking medication daily as prescribed, monitor carbohydrates- foods that are high in carbohydrates are the following rice, potatoes, breads, sugars, and pastas.  Reduction in the intake (eating) will assist in lowering your blood sugars. Exercise daily at least 30 minutes daily.   After this he agreed to start taking insulin   -     sitaGLIPtin-metformin (JANUMET) 50-1000 MG tablet; Take 1 tablet by mouth 2 (two) times daily with a meal. -     glipiZIDE (GLUCOTROL) 10 MG tablet; Take 1 tab by mouth twice daily with meals -     HgB A1c -     Glucose (CBG) -     Ambulatory referral to Ophthalmology  SOB (shortness of breath) -     albuterol (VENTOLIN HFA) 108 (90 Base) MCG/ACT inhaler; Inhale 2 puffs into the lungs every 6 (six) hours as needed for wheezing or shortness of breath. General appearance: Alert, appears fatigued, but nontoxic; speaking in full sentences without difficulty HEENT:NCAT; Ears: EACs clear, TMs pearly gray; Eyes: PERRL.  EOM grossly intact. Nose: nares patent without rhinorrhea; Throat: tonsils nonerythematous or enlarged, uvula midline  Neck: supple without LAD Lungs: clear to auscultation bilaterally without adventitious breath sounds; normal respiratory effort; mild cough present Heart: regular rate and rhythm.  Radial pulses 2+ symmetrical bilaterally Skin: warm and dry Psychological: alert and cooperative; normal mood and affect  Meds ordered this encounter  Medications  . hydrochlorothiazide (HYDRODIURIL) 25 MG tablet    Sig: Take 1 tablet (25 mg total) by mouth daily.    Dispense:  90 tablet    Refill:  3  .  lisinopril (ZESTRIL) 40 MG tablet    Sig: Take 1 tab by mouth daily    Dispense:  90 tablet    Refill:  1    Please consider 90 day supplies to promote better adherence  . amLODipine (NORVASC) 10 MG tablet  Sig: Take 1 tablet (10 mg total) by mouth daily.    Dispense:  90 tablet    Refill:  3  . sitaGLIPtin-metformin (JANUMET) 50-1000 MG tablet    Sig: Take 1 tablet by mouth 2 (two) times daily with a meal.    Dispense:  180 tablet    Refill:  1  . glipiZIDE (GLUCOTROL) 10 MG tablet    Sig: Take 1 tab by mouth twice daily with meals    Dispense:  180 tablet    Refill:  1  . gabapentin (NEURONTIN) 100 MG capsule    Sig: Take 1 capsule (100 mg total) by mouth 3 (three) times daily.    Dispense:  90 capsule    Refill:  3  . insulin glargine (LANTUS SOLOSTAR) 100 UNIT/ML Solostar Pen    Sig: Inject 12 Units into the skin daily.    Dispense:  15 mL    Refill:  PRN  . albuterol (VENTOLIN HFA) 108 (90 Base) MCG/ACT inhaler    Sig: Inhale 2 puffs into the lungs every 6 (six) hours as needed for wheezing or shortness of breath.    Dispense:  18 g    Refill:  1  . guaiFENesin (ROBITUSSIN) 100 MG/5ML SOLN    Sig: Take 5 mLs (100 mg total) by mouth every 4 (four) hours as needed for cough or to loosen phlegm.    Dispense:  236 mL    Refill:  0    Orders Placed This Encounter  Procedures  . Ambulatory referral to Ophthalmology    Referral Priority:   Routine    Referral Type:   Consultation    Referral Reason:   Specialty Services Required    Requested Specialty:   Ophthalmology    Number of Visits Requested:   1  . HgB A1c  . Glucose (CBG)    Meds ordered this encounter  Medications  . hydrochlorothiazide (HYDRODIURIL) 25 MG tablet    Sig: Take 1 tablet (25 mg total) by mouth daily.    Dispense:  90 tablet    Refill:  3  . lisinopril (ZESTRIL) 40 MG tablet    Sig: Take 1 tab by mouth daily    Dispense:  90 tablet    Refill:  1    Please consider 90 day supplies to  promote better adherence  . amLODipine (NORVASC) 10 MG tablet    Sig: Take 1 tablet (10 mg total) by mouth daily.    Dispense:  90 tablet    Refill:  3  . sitaGLIPtin-metformin (JANUMET) 50-1000 MG tablet    Sig: Take 1 tablet by mouth 2 (two) times daily with a meal.    Dispense:  180 tablet    Refill:  1  . glipiZIDE (GLUCOTROL) 10 MG tablet    Sig: Take 1 tab by mouth twice daily with meals    Dispense:  180 tablet    Refill:  1  . gabapentin (NEURONTIN) 100 MG capsule    Sig: Take 1 capsule (100 mg total) by mouth 3 (three) times daily.    Dispense:  90 capsule    Refill:  3  . insulin glargine (LANTUS SOLOSTAR) 100 UNIT/ML Solostar Pen    Sig: Inject 12 Units into the skin daily.    Dispense:  15 mL    Refill:  PRN  . albuterol (VENTOLIN HFA) 108 (90 Base) MCG/ACT inhaler    Sig: Inhale 2 puffs into the lungs every 6 (six) hours as  needed for wheezing or shortness of breath.    Dispense:  18 g    Refill:  1  . guaiFENesin (ROBITUSSIN) 100 MG/5ML SOLN    Sig: Take 5 mLs (100 mg total) by mouth every 4 (four) hours as needed for cough or to loosen phlegm.    Dispense:  236 mL    Refill:  0    Follow-up: Return in about 3 months (around 02/09/2021) for clinical pharmasist asap/ PCP.    Kerin Perna, NP

## 2020-11-14 ENCOUNTER — Ambulatory Visit: Payer: Self-pay

## 2020-11-21 ENCOUNTER — Ambulatory Visit: Payer: Self-pay

## 2020-11-23 ENCOUNTER — Ambulatory Visit: Payer: Self-pay | Admitting: Pharmacist

## 2020-11-30 ENCOUNTER — Ambulatory Visit (INDEPENDENT_AMBULATORY_CARE_PROVIDER_SITE_OTHER): Payer: Self-pay | Admitting: Primary Care

## 2020-11-30 ENCOUNTER — Encounter (INDEPENDENT_AMBULATORY_CARE_PROVIDER_SITE_OTHER): Payer: Self-pay | Admitting: Primary Care

## 2020-11-30 ENCOUNTER — Other Ambulatory Visit: Payer: Self-pay

## 2020-11-30 DIAGNOSIS — R059 Cough, unspecified: Secondary | ICD-10-CM

## 2020-11-30 NOTE — Progress Notes (Signed)
Pt was prescribed inhaler at last OV; he states that he doesn't help much

## 2020-11-30 NOTE — Progress Notes (Signed)
Virtual Visit via Telephone Note  I connected with Jerol Rufener on 11/30/20 at  3:30 PM EST by telephone and verified that I am speaking with the correct person using two identifiers.  Location: Patient: home  Provider: Grayce Sessions @[facility]    I discussed the limitations, risks, security and privacy concerns of performing an evaluation and management service by telephone and the availability of in person appointments. I also discussed with the patient that there may be a patient responsible charge related to this service. The patient expressed understanding and agreed to proceed.  History of Present Illness: Mr. 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 Dry cough only after waking up, no phelem and through out the day.   Observations/Objective: Review of Systems  Respiratory: Positive for cough.   All other systems reviewed and are negative.   Assessment and Plan: Jakeb was seen today for cough.  Diagnoses and all orders for this visit:  Cough Explained his Bp medication was an ACE inhibitors used for  blood pressure medications that protect your heart and kidneys. Most common symptom is a dry cough/tickle in your throat that can happen the first day you take it or 5 years after you have been taking it. Discontinued Lisinopril. Instructed to call office in 6-8 weeks for Bp check and cough discontinued.  Follow Up Instructions:    I discussed the assessment and treatment plan with the patient. The patient was provided an opportunity to ask questions and all were answered. The patient agreed with the plan and demonstrated an understanding of the instructions.   The patient was advised to call back or seek an in-person evaluation if the symptoms worsen or if the condition fails to improve as anticipated.  I provided 12 minutes of non-face-to-face time during this encounter.   Alice Reichert, NP

## 2020-12-02 ENCOUNTER — Ambulatory Visit: Payer: Self-pay | Attending: Primary Care | Admitting: Pharmacist

## 2020-12-02 ENCOUNTER — Other Ambulatory Visit: Payer: Self-pay

## 2020-12-02 ENCOUNTER — Other Ambulatory Visit: Payer: Self-pay | Admitting: Family Medicine

## 2020-12-02 ENCOUNTER — Encounter: Payer: Self-pay | Admitting: Pharmacist

## 2020-12-02 DIAGNOSIS — E1165 Type 2 diabetes mellitus with hyperglycemia: Secondary | ICD-10-CM

## 2020-12-02 DIAGNOSIS — Z23 Encounter for immunization: Secondary | ICD-10-CM

## 2020-12-02 MED ORDER — TRUEPLUS LANCETS 28G MISC: 2 refills | Status: DC

## 2020-12-02 MED ORDER — TRULICITY 0.75 MG/0.5ML ~~LOC~~ SOAJ: 0.7500 mg | SUBCUTANEOUS | 0 refills | Status: DC

## 2020-12-02 MED ORDER — METFORMIN HCL 1000 MG PO TABS: 1000.0000 mg | ORAL_TABLET | Freq: Two times a day (BID) | ORAL | 2 refills | Status: DC

## 2020-12-02 MED ORDER — TRUE METRIX METER W/DEVICE KIT: PACK | 0 refills | Status: DC

## 2020-12-02 MED ORDER — TRUE METRIX BLOOD GLUCOSE TEST VI STRP: ORAL_STRIP | 2 refills | Status: DC

## 2020-12-02 MED ORDER — METFORMIN HCL ER 500 MG PO TB24: 1000.0000 mg | ORAL_TABLET | Freq: Two times a day (BID) | ORAL | 2 refills | Status: DC

## 2020-12-02 MED FILL — !TRULICITY 0.75 MG/0.5 ML P: 0.75 | 28 days supply | Qty: 2 | Fill #0

## 2020-12-02 MED FILL — metFORMIN HCL ER 500 MG TB2: 500 | 30 days supply | Qty: 120 | Fill #0

## 2020-12-02 MED FILL — TRUE METRIX GLUCOSE TEST ST: 90 days supply | Qty: 100 | Fill #0

## 2020-12-02 MED FILL — TRUEplus LANCETS 28G MISC: 90 days supply | Qty: 100 | Fill #0

## 2020-12-02 MED FILL — TRUE METRIX GO GLUCOSE METE: W/DEVICE | 1 days supply | Qty: 1 | Fill #0

## 2020-12-02 NOTE — Patient Instructions (Signed)
Thank you for coming to see me today. Please do the following:  1. Stop Janumet.  2. Start metformin 1000 mg. Take 1 tablet in the morning and 1 tablet in the evening.  3. Continue glipizide 10 mg. Take 1 tablet in the morning and 1 tablet in the evening.  4. Start Trulicity. Take 1 injection once a week.  5. Continue checking blood sugars at home. 6. Continue making the lifestyle changes we've discussed together during our visit. Diet and exercise play a significant role in improving your blood sugars.  7. Follow-up with me in 1 month.     Spanish:   Gracias por venir a verme hoy. Por favor haga lo siguiente:  1. Detn a Janumet. 2. Comience con metformina 1000 mg. Tomar 1 tableta por la maana y 1 tableta por la noche. 3. Contine con glipizida 10 mg. Tomar 1 tableta por la maana y 1 tableta por la noche. 4. Inicie Trulicity. Tome 1 inyeccin una vez a la semana. 5. Contine controlando los niveles de Transport planner. 6. Contine haciendo los cambios de estilo de vida que discutimos juntos durante nuestra visita. La dieta y el ejercicio juegan un papel importante en la mejora de los niveles de Production assistant, radio. 7. Haz un seguimiento conmigo en 1 mes.

## 2020-12-02 NOTE — Progress Notes (Signed)
    S:    PCP: Marcelino Duster   No chief complaint on file.  Patient arrives in good spirits.  Presents for diabetes evaluation, education, and management. Patient was referred and last seen by Primary Care Provider on 11/30/2020.    Patient reports Diabetes was diagnosed in >5 years ago. He has only taken oral DM medications. Never insulin. Has not started the insulin prescribed by his PCP. Denies hx of pancreatitis. No personal or fhx of thyroid cancer.   Family/Social History:  - Fhx: no pertinent positives  - Tobacco: never smoker  - Alcohol: occasional use   Insurance coverage/medication affordability: none   Medication adherence denied. He struggles to remember to take his medications daily. Misses several doses per week.  Current diabetes medications include: Janumet 50-1000 mg BID, glipizide 10 mg BID, Basaglar 12 units daily (not taking) Current hypertension medications include: amlodipine 10 mg daily, HCTZ 25 mg daily Current hyperlipidemia medications include: pravastatin 80 mg daily, Zetia 10 mg daily  Patient denies hypoglycemic events.  Patient reported dietary habits:  - Admits to dietary indiscretion   Patient-reported exercise habits: none    Patient denies nocturia (nighttime urination).  Patient denies neuropathy (nerve pain). Patient denies visual changes. Patient reports self foot exams.     O:  Physical Exam   ROS   Lab Results  Component Value Date   HGBA1C 10.3 (A) 11/09/2020   There were no vitals filed for this visit.  Lipid Panel     Component Value Date/Time   CHOL 278 (H) 08/16/2020 0919   TRIG 1,104 (HH) 08/16/2020 0919   HDL 32 (L) 08/16/2020 0919   CHOLHDL 8.7 (H) 08/16/2020 0919   LDLCALC Comment (A) 08/16/2020 0919    Home fasting blood sugars: not checking  2 hour post-meal/random blood sugars: not checking.   Clinical Atherosclerotic Cardiovascular Disease (ASCVD): No  The 10-year ASCVD risk score Denman George DC Jr., et al.,  2013) is: 12.1%   Values used to calculate the score:     Age: 44 years     Sex: Male     Is Non-Hispanic African American: No     Diabetic: Yes     Tobacco smoker: No     Systolic Blood Pressure: 132 mmHg     Is BP treated: Yes     HDL Cholesterol: 32 mg/dL     Total Cholesterol: 278 mg/dL    A/P: Diabetes longstanding currently uncontrolled. Patient is able to verbalize appropriate hypoglycemia management plan. Medication adherence is a major problem. He desires a weekly injectable to help with this. Will change Janumet to single agent metformin and add Trulicity weekly.  -Discontinued Janumet.  -Start metformin 500 mg XR. Take 1000 mg (2 tablets) BID.  -Started Trulicity 0.75 mg weekly. Injection technique counseling given.  -True Metrix supplies sent.  -Extensively discussed pathophysiology of diabetes, recommended lifestyle interventions, dietary effects on blood sugar control -Counseled on s/sx of and management of hypoglycemia -Next A1C anticipated 01/2021.  -Urine microalbumin:creatinine ratio -HM: fluarix given  Written patient instructions provided.  Total time in face to face counseling 30 minutes.   Follow up Pharmacist Clinic Visit in 1 month.   Butch Penny, PharmD, Patsy [place], CPP Clinical Pharmacist [facility] [phone]

## 2020-12-03 LAB — MICROALBUMIN / CREATININE URINE RATIO
Creatinine, Urine: 37.5 mg/dL
Microalb/Creat Ratio: 780 mg/g creat — ABNORMAL HIGH (ref 0–29)
Microalbumin, Urine: 292.6 ug/mL

## 2020-12-06 ENCOUNTER — Ambulatory Visit: Payer: Self-pay

## 2020-12-07 ENCOUNTER — Telehealth (INDEPENDENT_AMBULATORY_CARE_PROVIDER_SITE_OTHER): Payer: Self-pay

## 2020-12-07 NOTE — Telephone Encounter (Signed)
-----   Message from Grayce Sessions, NP sent at 12/05/2020  2:28 PM EST ----- Kidneys are being affective by uncontrolled diabetes

## 2020-12-07 NOTE — Telephone Encounter (Signed)
Call placed to patient with pacific interpreter [phone]) patient did not answer. Per DPR left detailed voicemail informing patient that microalbumin results show that kidneys are being affected by uncontrolled diabetes. Advised to take diabetic medications as prescribed. Return call to [phone] with any questions or concerns. Maryjean Morn, CMA

## 2020-12-13 ENCOUNTER — Ambulatory Visit: Payer: Self-pay | Attending: Primary Care

## 2020-12-13 ENCOUNTER — Other Ambulatory Visit: Payer: Self-pay

## 2021-01-02 ENCOUNTER — Ambulatory Visit: Payer: Self-pay | Admitting: Pharmacist

## 2021-01-03 ENCOUNTER — Ambulatory Visit: Payer: Self-pay | Admitting: Pharmacist

## 2021-01-13 ENCOUNTER — Ambulatory Visit: Payer: Self-pay | Admitting: Pharmacist

## 2021-01-16 ENCOUNTER — Other Ambulatory Visit: Payer: Self-pay | Admitting: Family Medicine

## 2021-01-16 DIAGNOSIS — E1165 Type 2 diabetes mellitus with hyperglycemia: Secondary | ICD-10-CM

## 2021-01-16 MED FILL — TRULICITY 0.75 MG/0.5 ML PE: 0.75 | 28 days supply | Qty: 2 | Fill #0

## 2021-01-16 NOTE — Telephone Encounter (Signed)
Requested medication (s) are due for refill today:   Yes  Requested medication (s) are on the active medication list:   Yes  Future visit scheduled:   Yes   Last ordered: 12/02/2020 55ml, 0 refills  Clinic note:   Returned because pt has an appt tomorrow 01/17/2021 with Emilia Beck Ausdall regarding this medication.   Requested Prescriptions  Pending Prescriptions Disp Refills   TRULICITY 0.75 MG/0.5ML SOPN [Pharmacy Med Name: TRULICITY 0.75 MG/0.5 ML P 0.75 Solution Pen-injector] 2 mL 0    Sig: Inject 0.75 mg into the skin once a week.      Endocrinology:  Diabetes - GLP-1 Receptor Agonists Failed - 01/16/2021 10:23 AM      Failed - HBA1C is between 0 and 7.9 and within 180 days    HbA1c, POC (controlled diabetic range)  Date Value Ref Range Status  11/09/2020 10.3 (A) 0.0 - 7.0 % Final          Passed - Valid encounter within last 6 months    Recent Outpatient Visits           1 month ago Uncontrolled type 2 diabetes mellitus with hyperglycemia, without long-term current use of insulin [facility])   [facility] [place], Jeannett Senior L, RPH-CPP   1 month ago Cough   [facility] Grayce Sessions, NP   2 months ago Essential hypertension   [facility] Gwinda Passe P, NP   5 months ago Uncontrolled type 2 diabetes mellitus with hyperglycemia, without long-term current use of insulin (HCC)   [facility] Grayce Sessions, NP   7 months ago Uncontrolled type 2 diabetes mellitus with hyperglycemia, without long-term current use of insulin (HCC)   [facility] Grayce Sessions, NP       Future Appointments             Tomorrow Lois Huxley, Cornelius Moras, RPH-CPP [facility]   In 3 weeks Grayce Sessions, NP [facility]

## 2021-01-17 ENCOUNTER — Other Ambulatory Visit: Payer: Self-pay

## 2021-01-17 ENCOUNTER — Other Ambulatory Visit: Payer: Self-pay | Admitting: Family Medicine

## 2021-01-17 ENCOUNTER — Encounter: Payer: Self-pay | Admitting: Pharmacist

## 2021-01-17 ENCOUNTER — Ambulatory Visit: Payer: Self-pay | Attending: Primary Care | Admitting: Pharmacist

## 2021-01-17 DIAGNOSIS — E1165 Type 2 diabetes mellitus with hyperglycemia: Secondary | ICD-10-CM

## 2021-01-17 LAB — GLUCOSE, POCT (MANUAL RESULT ENTRY): POC Glucose: 199 mg/dL — AB (ref 70–99)

## 2021-01-17 MED ORDER — FENOFIBRATE 145 MG PO TABS: 145.0000 mg | ORAL_TABLET | Freq: Every day | ORAL | 2 refills | Status: DC

## 2021-01-17 MED ORDER — TRULICITY 1.5 MG/0.5ML ~~LOC~~ SOAJ: 1.5000 mg | SUBCUTANEOUS | 2 refills | Status: DC

## 2021-01-17 MED FILL — FENOFIBRATE 145 MG TABLET: 145 | 30 days supply | Qty: 30 | Fill #0

## 2021-01-17 NOTE — Progress Notes (Signed)
    S:    PCP: Marcelino Duster   No chief complaint on file.  Patient arrives in good spirits.  Presents for diabetes evaluation, education, and management. Patient was referred and last seen by Primary Care Provider on 11/30/2020.  We saw her on 12/02/2020 and stopped Janumet. Pt was placed on metformin XR instead. We also started Trulicity 0.75 mg weekly.   Today, pt reports compliance with all medications. Tolerating Trulicity well. Denies NV, abdominal pain.   Family/Social History:  - Fhx: no pertinent positives  - Tobacco: never smoker  - Alcohol: occasional use   Insurance coverage/medication affordability: none   Medication adherence reported. Current diabetes medications include: glipizide 10 mg BID, metformin XR 1000 mg BID, Trulicity 0.75 mg weekly  Current hypertension medications include: amlodipine 10 mg daily, HCTZ 25 mg daily Current hyperlipidemia medications include: pravastatin 80 mg daily, Zetia 10 mg daily  Patient denies hypoglycemic events.  Patient reported dietary habits:  - Admits to dietary indiscretion   Patient-reported exercise habits: none    Patient denies nocturia (nighttime urination).  Patient denies neuropathy (nerve pain). Patient denies visual changes. Patient reports self foot exams.     O:  POCT: 199  Lab Results  Component Value Date   HGBA1C 10.3 (A) 11/09/2020   There were no vitals filed for this visit.  Lipid Panel     Component Value Date/Time   CHOL 278 (H) 08/16/2020 0919   TRIG 1,104 (HH) 08/16/2020 0919   HDL 32 (L) 08/16/2020 0919   CHOLHDL 8.7 (H) 08/16/2020 0919   LDLCALC Comment (A) 08/16/2020 0919   Home fasting blood sugars: 160s - 190s  2 hour post-meal/random blood sugars: not checking  Clinical Atherosclerotic Cardiovascular Disease (ASCVD): No  The 10-year ASCVD risk score Denman George DC Jr., et al., 2013) is: 13%   Values used to calculate the score:     Age: 45 years     Sex: Male     Is Non-Hispanic  African American: No     Diabetic: Yes     Tobacco smoker: No     Systolic Blood Pressure: 138 mmHg     Is BP treated: Yes     HDL Cholesterol: 32 mg/dL     Total Cholesterol: 278 mg/dL    A/P: Diabetes longstanding currently uncontrolled. Patient is able to verbalize appropriate hypoglycemia management plan. Medication adherence reported with metformin, glipizide, and Trulicity.  -Continue metformin XR. Take 1000 mg (2 tablets) BID.  -Inc Trulicity to 1.5 mg weekly. -Cont. Glipizide for now. -Extensively discussed pathophysiology of diabetes, recommended lifestyle interventions, dietary effects on blood sugar control -Counseled on s/sx of and management of hypoglycemia -Next A1C anticipated 01/2021.  - ASCVD risk: pt with significant elevations in TG. Will start fenofibrate. He has no insurance and therefore cannot obtain Vascepa. He is compliant with his statin and Zetia. Should consider inc to high intensity statin at f/u.   Written patient instructions provided.  Total time in face to face counseling 30 minutes.   Follow up Pharmacist Clinic Visit in 1 month.   Butch Penny, PharmD, P[place]y [place], CPP Clinical Pharmacist [facility] [phone]

## 2021-02-09 ENCOUNTER — Other Ambulatory Visit: Payer: Self-pay

## 2021-02-09 ENCOUNTER — Ambulatory Visit (INDEPENDENT_AMBULATORY_CARE_PROVIDER_SITE_OTHER): Payer: Self-pay | Admitting: Primary Care

## 2021-02-09 ENCOUNTER — Encounter (INDEPENDENT_AMBULATORY_CARE_PROVIDER_SITE_OTHER): Payer: Self-pay | Admitting: Primary Care

## 2021-02-09 VITALS — BP 126/85 | HR 88 | Temp 97.3°F | Wt 171.4 lb

## 2021-02-09 DIAGNOSIS — I10 Essential (primary) hypertension: Secondary | ICD-10-CM

## 2021-02-09 DIAGNOSIS — E782 Mixed hyperlipidemia: Secondary | ICD-10-CM

## 2021-02-09 DIAGNOSIS — E1165 Type 2 diabetes mellitus with hyperglycemia: Secondary | ICD-10-CM

## 2021-02-09 DIAGNOSIS — M79672 Pain in left foot: Secondary | ICD-10-CM

## 2021-02-09 LAB — POCT CBG (FASTING - GLUCOSE)-MANUAL ENTRY: Glucose Fasting, POC: 118 mg/dL — AB (ref 70–99)

## 2021-02-09 LAB — POCT GLYCOSYLATED HEMOGLOBIN (HGB A1C): Hemoglobin A1C: 7.5 % — AB (ref 4.0–5.6)

## 2021-02-09 NOTE — Patient Instructions (Signed)
Diabetes mellitus y nutricin, en adultos Diabetes Mellitus and Nutrition, Adult Si sufre de diabetes, o diabetes mellitus, es muy importante tener hbitos alimenticios saludables debido a que sus niveles de azcar en la sangre (glucosa) se ven afectados en gran medida por lo que come y bebe. Comer alimentos saludables en las cantidades correctas, aproximadamente a la misma hora todos los das, lo ayudar a: Controlar la glucemia. Disminuir el riesgo de sufrir una enfermedad cardaca. Mejorar la presin arterial. Alcanzar o mantener un peso saludable. Qu puede afectar mi plan de alimentacin? Todas las personas que sufren de diabetes son diferentes y cada una tiene necesidades diferentes en cuanto a un plan de alimentacin. El mdico puede recomendarle que trabaje con un nutricionista para elaborar el mejor plan para usted. Su plan de alimentacin puede variar segn factores como: Las caloras que necesita. Los medicamentos que toma. Su peso. Sus niveles de glucemia, presin arterial y colesterol. Su nivel de actividad. Otras afecciones que tenga, como enfermedades cardacas o renales. Cmo me afectan los carbohidratos? Los carbohidratos, o hidratos de carbono, afectan su nivel de glucemia ms que cualquier otro tipo de alimento. La ingesta de carbohidratos naturalmente aumenta la cantidad de glucosa en la sangre. El recuento de carbohidratos es un mtodo destinado a llevar un registro de la cantidad de carbohidratos que se consumen. El recuento de carbohidratos es importante para mantener la glucemia a un nivel saludable, especialmente si utiliza insulina o toma determinados medicamentos por va oral para la diabetes. Es importante conocer la cantidad de carbohidratos que se pueden ingerir en cada comida sin correr ningn riesgo. Esto es diferente en cada persona. Su nutricionista puede ayudarlo a calcular la cantidad de carbohidratos que debe ingerir en cada comida y en cada refrigerio. Cmo  me afecta el alcohol? El alcohol puede provocar disminuciones sbitas de la glucemia (hipoglucemia), especialmente si utiliza insulina o toma determinados medicamentos por va oral para la diabetes. La hipoglucemia es una afeccin potencialmente mortal. Los sntomas de la hipoglucemia, como somnolencia, mareos y confusin, son similares a los sntomas de haber consumido demasiado alcohol. No beba alcohol si: Su mdico le indica no hacerlo. Est embarazada, puede estar embarazada o est tratando de quedar embarazada. Si bebe alcohol: No beba con el estmago vaco. Limite la cantidad que bebe: De 0 a 1 medida por da para las mujeres. De 0 a 2 medidas por da para los hombres. Est atento a la cantidad de alcohol que hay en las bebidas que toma. En los [place], una medida equivale a una botella de cerveza de 12 oz (355 ml), un vaso de vino de 5 oz (148 ml) o un vaso de una bebida alcohlica de alta graduacin de 1 oz (44 ml). Mantngase hidratado bebiendo agua, refrescos dietticos o t helado sin azcar. Tenga en cuenta que los refrescos comunes, los jugos y otras bebida para mezclar pueden contener mucha azcar y se deben contar como carbohidratos. Consejos para seguir este plan Leer las etiquetas de los alimentos Comience por leer el tamao de la porcin en la "Informacin nutricional" en las etiquetas de los alimentos envasados y las bebidas. La cantidad de caloras, carbohidratos, grasas y otros nutrientes mencionados en la etiqueta se basan en una porcin del alimento. Muchos alimentos contienen ms de una porcin por envase. Verifique la cantidad total de gramos (g) de carbohidratos totales en una porcin. Puede calcular la cantidad de porciones de carbohidratos al dividir el total de carbohidratos por 15. Por ejemplo, si un alimento tiene un   total de 30 g de carbohidratos totales por porcin, equivale a 2 porciones de carbohidratos. Verifique la cantidad de gramos (g) de grasas  saturadas y grasas trans de una porcin. Escoja alimentos que no contengan estas grasas o que su contenido de estas sea bajo. Verifique la cantidad de miligramos (mg) de sal (sodio) en una porcin. La mayora de las personas deben limitar la ingesta de sodio total a menos de 2300 mg por da. Siempre consulte la informacin nutricional de los alimentos etiquetados como "con bajo contenido de grasa" o "sin grasa". Estos alimentos pueden tener un mayor contenido de azcar agregada o carbohidratos refinados, y deben evitarse. Hable con su nutricionista para identificar sus objetivos diarios en cuanto a los nutrientes mencionados en la etiqueta. Al ir de compras Evite comprar alimentos procesados, enlatados o precocidos. Estos alimentos tienden a tener una mayor cantidad de grasa, sodio y azcar agregada. Compre en la zona exterior de la tienda de comestibles. Esta es la zona donde se encuentran con mayor frecuencia las frutas y las verduras frescas, los cereales a granel, las carnes frescas y los productos lcteos frescos. Al cocinar Utilice mtodos de coccin a baja temperatura, como hornear, en lugar de mtodos de coccin a alta temperatura, como frer en abundante aceite. Cocine con aceites saludables, como el aceite de oliva, canola o girasol. Evite cocinar con manteca, crema o carnes con alto contenido de grasa. Planificacin de las comidas Coma las comidas y los refrigerios regularmente, preferentemente a la misma hora todos los das. Evite pasar largos perodos de tiempo sin comer. Consuma alimentos ricos en fibra, como frutas frescas, verduras, frijoles y cereales integrales. Consulte a su nutricionista sobre cuntas porciones de carbohidratos puede consumir en cada comida. Consuma entre 4 y 6 onzas (entre 112 y 168 g) de protenas magras por da, como carnes magras, pollo, pescado, huevos o tofu. Una onza (oz) de protena magra equivale a: 1 onza (28 g) de carne, pollo o pescado. 1 huevo.  de  taza (62 g) de tofu. Coma algunos alimentos por da que contengan grasas saludables, como aguacates, frutos secos, semillas y pescado. Qu alimentos debo comer? Frutas Bayas. Manzanas. Naranjas. Duraznos. Damascos. Ciruelas. Uvas. Mango. Papaya. Granada. Kiwi. Cerezas. Verduras Lechuga. Espinaca. Verduras de hoja verde, que incluyen col rizada, acelga, hojas de berza y de mostaza. Remolachas. Coliflor. Repollo. Brcoli. Zanahorias. Judas verdes. Tomates. Pimientos. Cebollas. Pepinos. Coles de Bruselas. Granos Granos integrales, como panes, galletas, tortillas, cereales y pastas de salvado o integrales. Avena sin azcar. Quinua. Arroz integral o salvaje. Carnes y otras protenas Mariscos. Carne de ave sin piel. Cortes magros de ave y carne de res. Tofu. Frutos secos. Semillas. Lcteos Productos lcteos sin grasa o con bajo contenido de grasa, como leche, yogur y queso. Es posible que los productos que se enumeran ms arriba no constituyan una lista completa de los alimentos y las bebidas que puede tomar. Consulte a un nutricionista para obtener ms informacin. Qu alimentos debo evitar? Frutas Frutas enlatadas al almbar. Verduras Verduras enlatadas. Verduras congeladas con mantequilla o salsa de crema. Granos Productos elaborados con harina y harina blanca refinada, como panes, pastas, bocadillos y cereales. Evite todos los alimentos procesados. Carnes y otras protenas Cortes de carne con alto contenido de grasa. Carne de ave con piel. Carnes empanizadas o fritas. Carne procesada. Evite las grasas saturadas. Lcteos Yogur, queso o leche enteros. Bebidas Bebidas azucaradas, como gaseosas o t helado. Es posible que los productos que se enumeran ms arriba no constituyan una lista completa de   los alimentos y las bebidas que debe evitar. Consulte a un nutricionista para obtener ms informacin. Preguntas para hacerle al mdico Es necesario que me rena con un instructor en el cuidado  de la diabetes? Es necesario que me rena con un nutricionista? A qu nmero puedo llamar si tengo preguntas? Cules son los mejores momentos para controlar la glucemia? Dnde encontrar ms informacin: Asociacin Estadounidense de la Diabetes (American Diabetes Association): diabetes.org Academy of Nutrition and Dietetics (Academia de Nutricin y Diettica): www.eatright.org National Institute of Diabetes and Digestive and Kidney Diseases (Instituto Nacional de la Diabetes y las Enfermedades Digestivas y Renales): www.niddk.nih.gov Association of Diabetes Care and Education Specialists (Asociacin de Especialistas en Atencin y Educacin sobre la Diabetes): www.diabeteseducator.org Resumen Es importante tener hbitos alimenticios saludables debido a que sus niveles de azcar en la sangre (glucosa) se ven afectados en gran medida por lo que come y bebe. Un plan de alimentacin saludable lo ayudar a controlar la glucemia y mantener un estilo de vida saludable. El mdico puede recomendarle que trabaje con un nutricionista para elaborar el mejor plan para usted. Tenga en cuenta que los carbohidratos (hidratos de carbono) y el alcohol tienen efectos inmediatos en sus niveles de glucemia. Es importante contar los carbohidratos que ingiere y consumir alcohol con prudencia. Esta informacin no tiene como fin reemplazar el consejo del mdico. Asegrese de hacerle al mdico cualquier pregunta que tenga. Document Revised: 01/07/2020 Document Reviewed: 01/07/2020 Elsevier Patient Education  2021 Elsevier Inc.  

## 2021-02-09 NOTE — Progress Notes (Signed)
Established Patient Office Visit  Subjective:  Patient ID: Corey Graham, male    DOB: [birth year]  Age: 45 y.o. MRN: [id number]  CC:  Chief Complaint  Patient presents with  . Diabetes    HPI Corey Graham is a 45 year old Hispanic male who presents for  presents for hypertension management -Denies shortness of breath, headaches, chest pain or lower extremity edema. States compliant with medication . Type 2 diabetes manage with PCP and Clinical pharmacist. Appt 02/07/21 and medications adjustments made for diabetes at that appointment. Increased Trulicity . Diabetes - Patient denies nocturia (nighttime urination)/ polyuria .neuropathy and  visual changes. Foot hurts only when putting on rubber boots (foot/ankle brace)    Past Medical History:  Diagnosis Date  . Diabetes mellitus without complication ([place]) 2094  . Essential hypertension 09/09/2017  . Low back pain 06/24/2017    Past Surgical History:  Procedure Laterality Date  . HERNIA REPAIR      History reviewed. No pertinent family history.  Social History   Socioeconomic History  . Marital status: Married    Spouse name: Not on file  . Number of children: 3  . Years of education: Not on file  . Highest education level: Not on file  Occupational History  . Not on file  Tobacco Use  . Smoking status: Never Smoker  . Smokeless tobacco: Never Used  Substance and Sexual Activity  . Alcohol use: Yes    Comment: infrequent  . Drug use: No  . Sexual activity: Yes  Other Topics Concern  . Not on file  Social History Narrative   [place] at home with wife and 3 children   Social Determinants of Health   Financial Resource Strain: Not on file  Food Insecurity: Not on file  Transportation Needs: Not on file  Physical Activity: Not on file  Stress: Not on file  Social Connections: Not on file  Intimate Partner Violence: Not on file    Outpatient Medications Prior to Visit  Medication Sig  Dispense Refill  . albuterol (VENTOLIN HFA) 108 (90 Base) MCG/ACT inhaler Inhale 2 puffs into the lungs every 6 (six) hours as needed for wheezing or shortness of breath. 18 g 1  . amLODipine (NORVASC) 10 MG tablet Take 1 tablet (10 mg total) by mouth daily. 90 tablet 3  . Blood Glucose Monitoring Suppl (TRUE METRIX METER) w/Device KIT Use to check blood sugar once daily. 1 kit 0  . Dulaglutide (TRULICITY) 1.5 BS/9.6GE SOPN Inject 1.5 mg into the skin once a week. 2 mL 2  . ezetimibe (ZETIA) 10 MG tablet Take 1 tablet (10 mg total) by mouth daily. 90 tablet 3  . fenofibrate (TRICOR) 145 MG tablet Take 1 tablet (145 mg total) by mouth daily. 30 tablet 2  . gabapentin (NEURONTIN) 100 MG capsule Take 1 capsule (100 mg total) by mouth 3 (three) times daily. 90 capsule 3  . glipiZIDE (GLUCOTROL) 10 MG tablet Take 1 tab by mouth twice daily with meals 180 tablet 1  . glucose blood (TRUE METRIX BLOOD GLUCOSE TEST) test strip Use to check blood sugar once daily. 100 each 2  . guaiFENesin (ROBITUSSIN) 100 MG/5ML SOLN Take 5 mLs (100 mg total) by mouth every 4 (four) hours as needed for cough or to loosen phlegm. 236 mL 0  . hydrochlorothiazide (HYDRODIURIL) 25 MG tablet Take 1 tablet (25 mg total) by mouth daily. 90 tablet 3  . metFORMIN (GLUCOPHAGE XR) 500 MG 24 hr  tablet Take 2 tablets (1,000 mg total) by mouth 2 (two) times daily with a meal. 120 tablet 2  . pravastatin (PRAVACHOL) 80 MG tablet Take 1 tablet (80 mg total) by mouth daily. 90 tablet 1  . TRUEplus Lancets 28G MISC Use to check blood sugar once daily. 100 each 2  . ibuprofen (ADVIL) 800 MG tablet TAKE 1 TABLET (800 MG TOTAL) BY MOUTH EVERY 8 (EIGHT) HOURS AS NEEDED FOR MODERATE PAIN. (Patient not taking: Reported on 11/30/2020) 90 tablet 1   No facility-administered medications prior to visit.    No Known Allergies  ROS Pertinent positive and negative noted in HPI    Objective:     BP (!) 141/99 (BP Location: Right Arm, Patient  Position: Sitting, Cuff Size: Normal)   Pulse 85   Temp (!) 97.3 F (36.3 C) (Temporal)   Wt 171 lb 6.4 oz (77.7 kg)   SpO2 98%   BMI 29.42 kg/m  Wt Readings from Last 3 Encounters:  02/09/21 171 lb 6.4 oz (77.7 kg)  08/05/20 176 lb 6.4 oz (80 kg)  05/30/20 172 lb 9.6 oz (78.3 kg)   Physical Exam General: Vital signs reviewed.  Patient is well-developed and well-nourished, in no acute distress and cooperative with exam.  Head: Normocephalic and atraumatic. Eyes: EOMI, conjunctivae normal, no scleral icterus.  Neck: Supple, trachea midline, normal ROM, no JVD, masses, thyromegaly, or carotid bruit present.  Cardiovascular: RRR, S1 normal, S2 normal, no murmurs, gallops, or rubs. Pulmonary/Chest: Clear to auscultation bilaterally, wheezes present on right upper and lower lobes , no rales, or rhonchi. Abdominal: Soft, non-tender, non-distended, BS +, no masses, organomegaly, or guarding present.  Musculoskeletal: No joint deformities, erythema,  Extremities: No lower extremity edema bilaterally,  pulses symmetric and intact bilaterally. No cyanosis or clubbing. Neurological: A&O x3, Strength is normal and symmetric bilaterally, cranial nerve II-XII are grossly intact, no focal motor deficit, sensory intact to light touch bilaterally.  Skin: Warm, dry and intact. No rashes or erythema. Psychiatric: Normal mood and affect. speech and behavior is normal. Cognition and memory are normal.   Health Maintenance Due  Topic Date Due  . OPHTHALMOLOGY EXAM  Never done  . HIV Screening  Never done  . COVID-19 Vaccine (3 - Booster for Pfizer series) 01/06/2021    There are no preventive care reminders to display for this patient.  No results found for: TSH Lab Results  Component Value Date   WBC 7.7 08/16/2020   HGB 14.7 08/16/2020   HCT 42.9 08/16/2020   MCV 90 08/16/2020   PLT 219 08/16/2020   Lab Results  Component Value Date   NA 135 08/16/2020   K 4.2 08/16/2020   CO2 20  08/16/2020   GLUCOSE 196 (H) 08/16/2020   BUN 12 08/16/2020   CREATININE 0.64 (L) 08/16/2020   BILITOT <0.2 08/16/2020   ALKPHOS 119 08/16/2020   AST 23 08/16/2020   ALT 28 08/16/2020   PROT 6.9 08/16/2020   ALBUMIN 4.2 08/16/2020   CALCIUM 8.9 08/16/2020   Lab Results  Component Value Date   CHOL 278 (H) 08/16/2020   Lab Results  Component Value Date   HDL 32 (L) 08/16/2020   Lab Results  Component Value Date   LDLCALC Comment (A) 08/16/2020   Lab Results  Component Value Date   TRIG 1,104 (HH) 08/16/2020   Lab Results  Component Value Date   CHOLHDL 8.7 (H) 08/16/2020   Lab Results  Component Value Date   HGBA1C  7.5 (A) 02/09/2021      Assessment & Plan:  Kaian was seen today for diabetes.  Diagnoses and all orders for this visit:  Uncontrolled type 2 diabetes mellitus with hyperglycemia, without long-term current use of insulin ([place]) Improving previously 10.3 now 7.5 medication adjusted.  Goal of therapy: Less than 6.5 hemoglobin A1c.  Continue monitor  foods that are high in carbohydrates are the following rice, potatoes, breads, sugars, and pastas.  Reduction in the intake (eating) will assist in lowering your blood sugars. -     HgB A1c -     Glucose (CBG), Fasting -     CMP14+EGFR -     CBC with Differential -     Ambulatory referral to Ophthalmology  Mixed hyperlipidemia Cholesterol elevated can lead to a stroke or heart attack to lower your number you can decrease your fatty foods, red meat, cheese, milk and increase fiber like whole grains and veggies. 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    Kerin Perna, NP

## 2021-02-10 LAB — CMP14+EGFR
ALT: 33 IU/L (ref 0–44)
AST: 31 IU/L (ref 0–40)
Albumin/Globulin Ratio: 1.7 (ref 1.2–2.2)
Albumin: 4.8 g/dL (ref 4.0–5.0)
Alkaline Phosphatase: 102 IU/L (ref 44–121)
BUN/Creatinine Ratio: 15 (ref 9–20)
BUN: 11 mg/dL (ref 6–24)
Bilirubin Total: 0.3 mg/dL (ref 0.0–1.2)
CO2: 24 mmol/L (ref 20–29)
Calcium: 9.6 mg/dL (ref 8.7–10.2)
Chloride: 100 mmol/L (ref 96–106)
Creatinine, Ser: 0.74 mg/dL — ABNORMAL LOW (ref 0.76–1.27)
GFR calc Af Amer: 131 mL/min/{1.73_m2} (ref >59)
GFR calc non Af Amer: 113 mL/min/{1.73_m2} (ref >59)
Globulin, Total: 2.9 g/dL (ref 1.5–4.5)
Glucose: 87 mg/dL (ref 65–99)
Potassium: 3.8 mmol/L (ref 3.5–5.2)
Sodium: 140 mmol/L (ref 134–144)
Total Protein: 7.7 g/dL (ref 6.0–8.5)

## 2021-02-10 LAB — LIPID PANEL
Chol/HDL Ratio: 4.7 ratio (ref 0.0–5.0)
Cholesterol, Total: 223 mg/dL — ABNORMAL HIGH (ref 100–199)
HDL: 47 mg/dL (ref >39)
LDL Chol Calc (NIH): 142 mg/dL — ABNORMAL HIGH (ref 0–99)
Triglycerides: 188 mg/dL — ABNORMAL HIGH (ref 0–149)
VLDL Cholesterol Cal: 34 mg/dL (ref 5–40)

## 2021-02-10 LAB — CBC WITH DIFFERENTIAL/PLATELET
Basophils Absolute: 0.1 10*3/uL (ref 0.0–0.2)
Basos: 2 %
EOS (ABSOLUTE): 0.6 10*3/uL — ABNORMAL HIGH (ref 0.0–0.4)
Eos: 7 %
Hematocrit: 45 % (ref 37.5–51.0)
Hemoglobin: 15.4 g/dL (ref 13.0–17.7)
Immature Grans (Abs): 0 10*3/uL (ref 0.0–0.1)
Immature Granulocytes: 0 %
Lymphocytes Absolute: 2.7 10*3/uL (ref 0.7–3.1)
Lymphs: 31 %
MCH: 29.8 pg (ref 26.6–33.0)
MCHC: 34.2 g/dL (ref 31.5–35.7)
MCV: 87 fL (ref 79–97)
Monocytes Absolute: 0.4 10*3/uL (ref 0.1–0.9)
Monocytes: 5 %
Neutrophils Absolute: 4.7 10*3/uL (ref 1.4–7.0)
Neutrophils: 55 %
Platelets: 272 10*3/uL (ref 150–450)
RBC: 5.16 x10E6/uL (ref 4.14–5.80)
RDW: 12.5 % (ref 11.6–15.4)
WBC: 8.5 10*3/uL (ref 3.4–10.8)

## 2021-02-14 ENCOUNTER — Ambulatory Visit: Payer: Self-pay | Attending: Primary Care | Admitting: Pharmacist

## 2021-02-14 ENCOUNTER — Encounter: Payer: Self-pay | Admitting: Pharmacist

## 2021-02-14 ENCOUNTER — Other Ambulatory Visit: Payer: Self-pay

## 2021-02-14 DIAGNOSIS — E1165 Type 2 diabetes mellitus with hyperglycemia: Secondary | ICD-10-CM

## 2021-02-14 LAB — GLUCOSE, POCT (MANUAL RESULT ENTRY): POC Glucose: 134 mg/dl — AB (ref 70–99)

## 2021-02-14 MED FILL — ?TRULICITY 1.5 MG/0.5 ML PE: 1.5 | 28 days supply | Qty: 2 | Fill #0

## 2021-02-14 NOTE — Patient Instructions (Signed)
Gracias por venir a verme hoy. Por favor haz lo siguiente: 1. Aumente Trulicity a 1,5 mg. Toma esto una vez a la semana. 2. Contine con metformina. Tome 2 comprimidos por la maana y 2 comprimidos por la noche. 3.Deja de glipizida. 4. Contine controlando los niveles de [place] en casa. 5. Contine haciendo los cambios de estilo de vida que hemos discutido juntos durante nuestra visita. La dieta y el ejercicio juegan un papel importante en la mejora de los niveles de azcar en la [place]. 6. Seguimiento conmigo en 1 mes.      Thank you for coming to see me today. Please do the following:  1. Increase Trulicity to 1.5 mg. Take this once a week.  2. Continue metformin. Take 2 tablets in the morning and 2 tablets in the evening.  3. Stop glipizide.  4. Continue checking blood sugars at home.  5. Continue making the lifestyle changes we've discussed together during our visit. Diet and exercise play a significant role in improving your blood sugars.  6. Follow-up with me in 1 month.

## 2021-02-14 NOTE — Progress Notes (Signed)
    S:    PCP: Marcelino Duster   No chief complaint on file.  Patient arrives in good spirits.  Presents for diabetes evaluation, education, and management. Patient was referred and last seen by Primary Care Provider on 02/09/2021.  We saw him on 01/17/2021 and increased his Trulicity.   Today, pt reports compliance with all medications. Tolerating Trulicity well. Denies NV, abdominal pain. Commended pt on recent A1c of 7.5%. Of note, he has not picked up the 1.5 mg dose of Trulicity yet. He still had 0.75 mg dose pens to complete and completed those last week.   Family/Social History:  - Fhx: no pertinent positives  - Tobacco: never smoker  - Alcohol: occasional use   Insurance coverage/medication affordability: none   Medication adherence reported. Current diabetes medications include: glipizide 10 mg BID, metformin XR 1000 mg BID, Trulicity 1.5 mg weekly (has not yet increased to the 1.5 mg dose - completed his supply of 0.75 mg pens last week)  Current hypertension medications include: amlodipine 10 mg daily, HCTZ 25 mg daily Current hyperlipidemia medications include: pravastatin 80 mg daily, Zetia 10 mg daily  Patient endorses hypoglycemic events. Occurred at work. He endorses blacking out. Treated successfully.   Patient reported dietary habits:  - Admits to dietary indiscretion   Patient-reported exercise habits: none    Patient denies nocturia (nighttime urination).  Patient denies neuropathy (nerve pain). Patient denies visual changes. Patient reports self foot exams.     O: Lab Results  Component Value Date   HGBA1C 7.5 (A) 02/09/2021   There were no vitals filed for this visit.  Lipid Panel     Component Value Date/Time   CHOL 223 (H) 02/09/2021 1013   TRIG 188 (H) 02/09/2021 1013   HDL 47 02/09/2021 1013   CHOLHDL 4.7 02/09/2021 1013   LDLCALC 142 (H) 02/09/2021 1013   Home fasting blood sugars: 160s - 190s  2 hour post-meal/random blood sugars: not  checking  Clinical Atherosclerotic Cardiovascular Disease (ASCVD): No  The 10-year ASCVD risk score Denman George DC Jr., et al., 2013) is: 4.6%   Values used to calculate the score:     Age: 45 years     Sex: Male     Is Non-Hispanic African American: No     Diabetic: Yes     Tobacco smoker: No     Systolic Blood Pressure: 126 mmHg     Is BP treated: Yes     HDL Cholesterol: 47 mg/dL     Total Cholesterol: 223 mg/dL    A/P: Diabetes longstanding currently uncontrolled. Patient is able to verbalize appropriate hypoglycemia management plan. Medication adherence reported with metformin, glipizide, and Trulicity.  -Continue metformin XR. Take 1000 mg (2 tablets) BID.  -Continue Trulicity. Pt will pick up and begin the 1.5 mg weekly dose as prescribed.  -Stop glipizide  -Extensively discussed pathophysiology of diabetes, recommended lifestyle interventions, dietary effects on blood sugar control -Counseled on s/sx of and management of hypoglycemia -Next A1C anticipated 04/2021.   Written patient instructions provided.  Total time in face to face counseling 30 minutes.   Follow up Pharmacist Clinic Visit in 1 month.   Butch Penny, PharmD, Patsy [place], CPP Clinical Pharmacist [facility] [phone]

## 2021-02-17 ENCOUNTER — Telehealth (INDEPENDENT_AMBULATORY_CARE_PROVIDER_SITE_OTHER): Payer: Self-pay

## 2021-02-17 ENCOUNTER — Other Ambulatory Visit (INDEPENDENT_AMBULATORY_CARE_PROVIDER_SITE_OTHER): Payer: Self-pay | Admitting: Primary Care

## 2021-02-17 DIAGNOSIS — E782 Mixed hyperlipidemia: Secondary | ICD-10-CM

## 2021-02-17 MED ORDER — FENOFIBRATE 145 MG PO TABS: 145.0000 mg | ORAL_TABLET | Freq: Every day | ORAL | 2 refills | Status: DC

## 2021-02-17 MED FILL — ?FENOFIBRATE 145 MG TABLET: 145 | 30 days supply | Qty: 30 | Fill #0

## 2021-02-17 NOTE — Telephone Encounter (Signed)
Call placed to patient using pacific interpreter [phone]) per DPR left voicemail notifying patient that his cholesterol has improved. Stop taking Zetia. Only take gemfibrozil and fenofibrate. Return call to [facility] at [phone] with any questions or concerns. Maryjean Morn, CMA

## 2021-02-17 NOTE — Telephone Encounter (Signed)
-----   Message from Grayce Sessions, NP sent at 02/17/2021  9:01 AM EST -----  Cholesterol improved. Continue to take gemfibrozil 600 twice daily and TriCor 145 milligrams daily and pravastatin 80 mg. Stop taking Zetia.

## 2021-02-24 MED FILL — ?FENOFIBRATE 145 MG TABLET: 145 | 30 days supply | Qty: 30 | Fill #0

## 2021-03-17 ENCOUNTER — Ambulatory Visit: Payer: Self-pay | Attending: Primary Care | Admitting: Pharmacist

## 2021-03-17 ENCOUNTER — Other Ambulatory Visit: Payer: Self-pay

## 2021-03-17 ENCOUNTER — Other Ambulatory Visit: Payer: Self-pay | Admitting: Primary Care

## 2021-03-17 DIAGNOSIS — E1165 Type 2 diabetes mellitus with hyperglycemia: Secondary | ICD-10-CM

## 2021-03-17 LAB — GLUCOSE, POCT (MANUAL RESULT ENTRY): POC Glucose: 146 mg/dl — AB (ref 70–99)

## 2021-03-17 MED ORDER — ROSUVASTATIN CALCIUM 20 MG PO TABS: 20.0000 mg | ORAL_TABLET | Freq: Every day | ORAL | 2 refills | Status: DC

## 2021-03-17 NOTE — Progress Notes (Signed)
    S:    PCP: Marcelino Duster   No chief complaint on file.  Patient arrives in good spirits.  Presents for diabetes evaluation, education, and management. Patient was referred and last seen by Primary Care Provider on 02/09/2021.  We saw him on 02/14/2021 and stopped glipizide.  Today, pt reports compliance with all medications. Tolerating Trulicity well. Denies NV, abdominal pain.  Family/Social History:  - Fhx: no pertinent positives  - Tobacco: never smoker  - Alcohol: occasional use   Insurance coverage/medication affordability: none   Medication adherence reported. Current diabetes medications include: metformin XR 1000 mg BID, Trulicity 1.5 mg weekly  Current hypertension medications include: amlodipine 10 mg daily, HCTZ 25 mg daily Current hyperlipidemia medications include: pravastatin 80 mg daily, Zetia 10 mg daily  Patient denies hypoglycemic events.  Patient reported dietary habits:  - Admits to dietary indiscretion   Patient-reported exercise habits: none    Patient denies nocturia (nighttime urination).  Patient denies neuropathy (nerve pain). Patient denies visual changes. Patient reports self foot exams.     O: POCT: 146 (post-prandial)  Lab Results  Component Value Date   HGBA1C 7.5 (A) 02/09/2021   There were no vitals filed for this visit.  Lipid Panel     Component Value Date/Time   CHOL 223 (H) 02/09/2021 1013   TRIG 188 (H) 02/09/2021 1013   HDL 47 02/09/2021 1013   CHOLHDL 4.7 02/09/2021 1013   LDLCALC 142 (H) 02/09/2021 1013   Home fasting blood sugars: 120s. 2 hour post-meal/random blood sugars: not checking  Clinical Atherosclerotic Cardiovascular Disease (ASCVD): No  The 10-year ASCVD risk score Denman George DC Jr., et al., 2013) is: 4.6%   Values used to calculate the score:     Age: 45 years     Sex: Male     Is Non-Hispanic African American: No     Diabetic: Yes     Tobacco smoker: No     Systolic Blood Pressure: 126 mmHg     Is BP  treated: Yes     HDL Cholesterol: 47 mg/dL     Total Cholesterol: 223 mg/dL   A/P: Diabetes longstanding currently close to goal. Patient is able to verbalize appropriate hypoglycemia management plan. Medication adherence reported with metformin and Trulicity with good home CBG control. -No medication changes.   -Extensively discussed pathophysiology of diabetes, recommended lifestyle interventions, dietary effects on blood sugar control -Counseled on s/sx of and management of hypoglycemia -Next A1C anticipated 04/2021.   ASCVD risk: primary prevention in patient with DM. LDL goal < 70. ASCVD risk score is not >20% but he has been compliant with pravastatin 80 mg and LDL is 142. I recommend to change to high intensity statin.  - Stop pravastatin.  - Start rosuvastatin 20 mg daily.  - Continue fenofibrate for now. Can consider d/c at follow-up.  Written patient instructions provided.  Total time in face to face counseling 30 minutes.   Follow up Pharmacist Clinic Visit in 3-4 weeks.   Butch Penny, PharmD, Patsy [place], CPP Clinical Pharmacist [facility] [phone]

## 2021-03-17 NOTE — Patient Instructions (Signed)
Thank you for coming to see me today. Please do the following:  1. Continue Trulicity once weekly.  2. Continue taking two tablets of metformin in the morning and 2 tablets in the evening.  3. Stop pravastatin.  4. Start taking rosuvastatin. Take 1 pill every day. You can take these in the evening.  5. Continue checking blood sugars at home. 6. Continue making the lifestyle changes we've discussed together during our visit. Diet and exercise play a significant role in improving your blood sugars.  7. Follow-up with me in 3 to 4 weeks.   Spanish:  Gracias por venir a verme hoy. Por favor haz lo siguiente:  1. Contine Trulicity una vez por semana. 2. Contine tomando dos comprimidos de metformina por la maana y 2 comprimidos por la noche. 3. Deja de pravastatina. 4. Empiece a tomar rosuvastatina. [place] 1 [employer]. Puedes tomarlos por la noche. 5. Contine controlando los niveles de Bank[place] en casa. 6. Contine haciendo los cambios de estilo de vida que hemos discutido juntos durante nuestra visita. La dieta y el ejercicio juegan un papel importante en la mejora de los niveles de azcar en la [place]. 7. Seguimiento conmigo en 3 a 4 semanas.

## 2021-03-18 ENCOUNTER — Other Ambulatory Visit: Payer: Self-pay

## 2021-03-18 MED FILL — Rosuvastatin Calcium Tab 20 MG: ORAL | 30 days supply | Qty: 30 | Fill #0 | Status: CN

## 2021-03-19 ENCOUNTER — Other Ambulatory Visit: Payer: Self-pay

## 2021-03-27 ENCOUNTER — Other Ambulatory Visit: Payer: Self-pay

## 2021-04-03 ENCOUNTER — Other Ambulatory Visit: Payer: Self-pay

## 2021-04-03 MED FILL — Dulaglutide Soln Auto-injector 1.5 MG/0.5ML: SUBCUTANEOUS | 28 days supply | Qty: 2 | Fill #0 | Status: AC

## 2021-04-04 ENCOUNTER — Other Ambulatory Visit: Payer: Self-pay

## 2021-04-04 NOTE — Progress Notes (Signed)
    S:    PCP: Gwinda Passe  Patient arrives in good spirits. Presents for diabetes evaluation, education, and management Patient was referred and last seen by Primary Care Provider on 02/09/21. Pt seen by pharmacy on 03/17/21. At that visit, pt reported medication adherence and no side effects with metformin and Trulicity 1.5 mg weekly  Today, patient reports medication adherence with Trulicity 1.5 mg weekly on Wednesdays, however sometimes misses evening dose of metformin. Reports not checking sugars consistently, but last time checked was 125-140 on 4/15. Denies polyuria, polydipsia, neuropathy, and blurry vision. Denies symptomatic hypoglycemia.   Family/Social History:  - Fhx: no pertinent positives  - Tobacco: never smoker  - Alcohol: occasional use   Insurance coverage/medication affordability: none   Medication adherence reported fair   Current diabetes medications include: metformin XR 1000 mg BID, Trulicity 1.5 mg weekly  Current hypertension medications include: amlodipine 10 mg daily, HCTZ 25 mg daily Current hyperlipidemia medications include: pravastatin 80 mg daily, Zetia 10 mg   Patient denies hypoglycemic events.  Patient reported dietary habits:  - Admits to dietary indiscretion   Patient-reported exercise habits: none    Patient denies nocturia (nighttime urination).  Patient denies neuropathy (nerve pain). Patient denies visual changes.    O:  POCT: 201 (post prandial)  Home fasting blood sugars: not checking 2 hour post-meal/random blood sugars: not checking   Lab Results  Component Value Date   HGBA1C 7.5 (A) 02/09/2021   There were no vitals filed for this visit.  Lipid Panel     Component Value Date/Time   CHOL 223 (H) 02/09/2021 1013   TRIG 188 (H) 02/09/2021 1013   HDL 47 02/09/2021 1013   CHOLHDL 4.7 02/09/2021 1013   LDLCALC 142 (H) 02/09/2021 1013   Clinical Atherosclerotic Cardiovascular Disease (ASCVD): No  The 10-year ASCVD  risk score Denman George DC Jr., et al., 2013) is: 4.6%   Values used to calculate the score:     Age: 22 years     Sex: Male     Is Non-Hispanic African American: No     Diabetic: Yes     Tobacco smoker: No     Systolic Blood Pressure: 126 mmHg     Is BP treated: Yes     HDL Cholesterol: 47 mg/dL     Total Cholesterol: 223 mg/dL    A/P: Diabetes longstanding currently near goal. Patient is able to verbalize appropriate hypoglycemia management plan. Medication adherence appears optimal. Encouraged patient to avoid missing evening dose of metformin. Encouraged patient to check sugars at least daily and to bring log or glucometer to next visit. Patient verbalized understanding. -No medication changes -Continued Trulicity 1.5 mg weekly -Continued metformin XR 1000 mg BID -Extensively discussed pathophysiology of diabetes, recommended lifestyle interventions, dietary effects on blood sugar control -Counseled on s/sx of and management of hypoglycemia -Next A1C anticipated 5/22.   ASCVD risk: primary prevention in patient with DM. LDL goal < 70. ASCVD risk score is not >20%. Recently switched from pravastatin to rosuvastatin and plans to pick up prescription today. - Continued rosuvastatin 20 mg daily.  - Continued fenofibrate 145 mg daily  Written patient instructions provided.  Total time in face to face counseling 25 minutes.   Follow up PCP Clinic Visit in ~4 weeks.     Fabio Neighbors, PharmD, BCPS PGY2 Ambulatory Care Resident [facility]  Pharmacy

## 2021-04-05 ENCOUNTER — Other Ambulatory Visit: Payer: Self-pay

## 2021-04-05 ENCOUNTER — Encounter: Payer: Self-pay | Admitting: Pharmacist

## 2021-04-05 ENCOUNTER — Ambulatory Visit: Payer: Self-pay | Attending: Primary Care | Admitting: Pharmacist

## 2021-04-05 DIAGNOSIS — E1165 Type 2 diabetes mellitus with hyperglycemia: Secondary | ICD-10-CM

## 2021-04-05 LAB — GLUCOSE, POCT (MANUAL RESULT ENTRY): POC Glucose: 201 mg/dl — AB (ref 70–99)

## 2021-04-05 MED FILL — Rosuvastatin Calcium Tab 20 MG: ORAL | 30 days supply | Qty: 30 | Fill #0 | Status: AC

## 2021-04-19 ENCOUNTER — Telehealth (INDEPENDENT_AMBULATORY_CARE_PROVIDER_SITE_OTHER): Payer: Self-pay

## 2021-04-19 ENCOUNTER — Other Ambulatory Visit (INDEPENDENT_AMBULATORY_CARE_PROVIDER_SITE_OTHER): Payer: Self-pay | Admitting: Primary Care

## 2021-04-19 DIAGNOSIS — M545 Low back pain, unspecified: Secondary | ICD-10-CM

## 2021-04-19 DIAGNOSIS — G8929 Other chronic pain: Secondary | ICD-10-CM

## 2021-04-19 MED ORDER — IBUPROFEN 800 MG PO TABS
800.0000 mg | ORAL_TABLET | Freq: Three times a day (TID) | ORAL | 0 refills | Status: DC | PRN
  Filled 2021-04-19 – 2021-04-28 (×2): qty 90, 30d supply, fill #0

## 2021-04-19 NOTE — Telephone Encounter (Signed)
Patient came in the office to request a refill for  ibuprofen (ADVIL) 800 MG tablet    Patient uses  [facility]  Please advice [phone]

## 2021-04-20 ENCOUNTER — Other Ambulatory Visit: Payer: Self-pay

## 2021-04-26 ENCOUNTER — Other Ambulatory Visit: Payer: Self-pay

## 2021-04-28 ENCOUNTER — Other Ambulatory Visit: Payer: Self-pay

## 2021-05-04 ENCOUNTER — Other Ambulatory Visit: Payer: Self-pay

## 2021-05-04 MED FILL — Albuterol Sulfate Inhal Aero 108 MCG/ACT (90MCG Base Equiv): RESPIRATORY_TRACT | 25 days supply | Qty: 18 | Fill #0 | Status: AC

## 2021-05-09 ENCOUNTER — Ambulatory Visit (INDEPENDENT_AMBULATORY_CARE_PROVIDER_SITE_OTHER): Payer: Self-pay | Admitting: Family Medicine

## 2021-05-09 ENCOUNTER — Other Ambulatory Visit: Payer: Self-pay

## 2021-05-17 ENCOUNTER — Ambulatory Visit (INDEPENDENT_AMBULATORY_CARE_PROVIDER_SITE_OTHER): Payer: Self-pay | Admitting: Primary Care

## 2021-05-17 ENCOUNTER — Other Ambulatory Visit: Payer: Self-pay

## 2021-05-17 ENCOUNTER — Encounter (INDEPENDENT_AMBULATORY_CARE_PROVIDER_SITE_OTHER): Payer: Self-pay | Admitting: Primary Care

## 2021-05-17 VITALS — BP 125/87 | HR 77 | Temp 97.5°F | Ht 64.0 in | Wt 168.8 lb

## 2021-05-17 DIAGNOSIS — I10 Essential (primary) hypertension: Secondary | ICD-10-CM

## 2021-05-17 DIAGNOSIS — M545 Low back pain, unspecified: Secondary | ICD-10-CM

## 2021-05-17 DIAGNOSIS — E782 Mixed hyperlipidemia: Secondary | ICD-10-CM

## 2021-05-17 DIAGNOSIS — E1165 Type 2 diabetes mellitus with hyperglycemia: Secondary | ICD-10-CM

## 2021-05-17 DIAGNOSIS — Z760 Encounter for issue of repeat prescription: Secondary | ICD-10-CM

## 2021-05-17 DIAGNOSIS — G8929 Other chronic pain: Secondary | ICD-10-CM

## 2021-05-17 DIAGNOSIS — E119 Type 2 diabetes mellitus without complications: Secondary | ICD-10-CM

## 2021-05-17 LAB — POCT GLYCOSYLATED HEMOGLOBIN (HGB A1C): Hemoglobin A1C: 9 % — AB (ref 4.0–5.6)

## 2021-05-17 LAB — GLUCOSE, POCT (MANUAL RESULT ENTRY): POC Glucose: 213 mg/dl — AB (ref 70–99)

## 2021-05-17 MED ORDER — AMLODIPINE BESYLATE 10 MG PO TABS
ORAL_TABLET | Freq: Every day | ORAL | 3 refills | Status: DC
Start: 2021-05-17 — End: 2021-08-16
  Filled 2021-05-17: qty 90, 90d supply, fill #0
  Filled 2021-06-07: qty 30, 30d supply, fill #0

## 2021-05-17 MED ORDER — GLIPIZIDE 10 MG PO TABS
ORAL_TABLET | ORAL | 1 refills | Status: DC
  Filled 2021-05-17: qty 180, 90d supply, fill #0

## 2021-05-17 MED ORDER — METFORMIN HCL ER 500 MG PO TB24
ORAL_TABLET | Freq: Two times a day (BID) | ORAL | 2 refills | Status: DC
  Filled 2021-05-17 – 2021-07-21 (×2): qty 120, 30d supply, fill #0

## 2021-05-17 MED ORDER — TRUEPLUS LANCETS 28G MISC
Freq: Every day | 2 refills | Status: DC
  Filled 2021-05-17: qty 100, 90d supply, fill #0

## 2021-05-17 MED ORDER — IBUPROFEN 800 MG PO TABS
800.0000 mg | ORAL_TABLET | Freq: Three times a day (TID) | ORAL | 0 refills | Status: DC | PRN
  Filled 2021-05-17: qty 90, 30d supply, fill #0

## 2021-05-17 MED ORDER — FENOFIBRATE 145 MG PO TABS
ORAL_TABLET | Freq: Every day | ORAL | 2 refills | Status: DC
  Filled 2021-05-17: qty 90, 90d supply, fill #0
  Filled 2021-06-07: qty 30, 30d supply, fill #0
  Filled 2021-12-15: qty 30, 30d supply, fill #1
  Filled 2022-02-12: qty 30, 30d supply, fill #0
  Filled 2022-04-20: qty 30, 30d supply, fill #1

## 2021-05-17 MED ORDER — DULAGLUTIDE 1.5 MG/0.5ML ~~LOC~~ SOAJ
1.5000 mg | SUBCUTANEOUS | 2 refills | Status: DC
Start: 2021-05-17 — End: 2021-08-16
  Filled 2021-05-17 – 2021-06-07 (×2): qty 2, 28d supply, fill #0
  Filled 2021-07-21: qty 2, 28d supply, fill #1

## 2021-05-17 MED ORDER — HYDROCHLOROTHIAZIDE 25 MG PO TABS
ORAL_TABLET | Freq: Every day | ORAL | 3 refills | Status: DC
  Filled 2021-05-17: qty 90, 90d supply, fill #0
  Filled 2021-06-07: qty 30, 30d supply, fill #0

## 2021-05-17 NOTE — Progress Notes (Signed)
Subjective:  Patient ID: Corey Graham, male    DOB: June 19, 1976  Age: 45 y.o. MRN: [id number]  CC: Follow-up (Diabetes )   HPI Corey Graham is 45 year old Hispanic male ([address] 532)  presents forFollow-up of diabetes. Patient does check blood sugar at home  Compliant with meds - Yes Checking CBGs? Yes  Fasting avg - 120-150   Postprandial average -  Exercising regularly? - Yes Watching carbohydrate intake? - Yes Neuropathy ? - No Hypoglycemic events - No  - Recovers with :   Pertinent ROS:  Polyuria - No Polydipsia - No Vision problems - changes- followed by opthalmology  Medications as noted below. Taking them regularly without complication/adverse reaction being reported today.   History Corey Graham has a past medical history of Diabetes mellitus without complication ([place]) (4193), Essential hypertension (09/09/2017), and Low back pain (06/24/2017).   He has a past surgical history that includes Hernia repair.   His family history is not on file.He reports that he has never smoked. He has never used smokeless tobacco. He reports current alcohol use. He reports that he does not use drugs.  Current Outpatient Medications on File Prior to Visit  Medication Sig Dispense Refill  . Blood Glucose Monitoring Suppl (TRUE METRIX GO GLUCOSE METER) w/Device KIT USE TO CHECK BLOOD SUGAR ONCE DAILY. 1 kit 0  . Blood Glucose Monitoring Suppl (TRUE METRIX METER) w/Device KIT Use to check blood sugar once daily. 1 kit 0  . gabapentin (NEURONTIN) 100 MG capsule TAKE 1 CAPSULE (100 MG TOTAL) BY MOUTH 3 (THREE) TIMES DAILY. 90 capsule 3  . glucose blood test strip USE TO CHECK BLOOD SUGAR ONCE DAILY. 100 strip 2  . rosuvastatin (CRESTOR) 20 MG tablet TAKE 1 TABLET (20 MG TOTAL) BY MOUTH DAILY. 30 tablet 2  . [DISCONTINUED] ezetimibe (ZETIA) 10 MG tablet Take 1 tablet (10 mg total) by mouth daily. 90 tablet 3  . [DISCONTINUED] pravastatin (PRAVACHOL) 80 MG tablet  Take 1 tablet (80 mg total) by mouth daily. 90 tablet 1   No current facility-administered medications on file prior to visit.    ROS Review of Systems  Gastrointestinal: Positive for abdominal pain.       Cramping after taking medications but not sure which one  All other systems reviewed and are negative.   Objective:  BP 125/87 (BP Location: Right Arm, Patient Position: Sitting, Cuff Size: Normal)   Pulse 77   Temp (!) 97.5 F (36.4 C) (Temporal)   Ht $R'5\' 4"'yi$  (1.626 m)   Wt 168 lb 12.8 oz (76.6 kg)   SpO2 95%   BMI 28.97 kg/m   BP Readings from Last 3 Encounters:  05/17/21 125/87  02/09/21 126/85  08/05/20 (!) 152/96    Wt Readings from Last 3 Encounters:  05/17/21 168 lb 12.8 oz (76.6 kg)  02/09/21 171 lb 6.4 oz (77.7 kg)  08/05/20 176 lb 6.4 oz (80 kg)    Physical Exam Vitals reviewed.  Constitutional:      Appearance: Normal appearance.  HENT:     Head: Normocephalic.     Right Ear: External ear normal.     Left Ear: External ear normal.     Nose: Nose normal.  Eyes:     Extraocular Movements: Extraocular movements intact.  Cardiovascular:     Rate and Rhythm: Normal rate and regular rhythm.  Pulmonary:     Effort: Pulmonary effort is normal.     Breath sounds: Normal breath sounds.  Abdominal:     General: Bowel sounds are normal. There is distension.     Palpations: Abdomen is soft.  Musculoskeletal:        General: Normal range of motion.     Cervical back: Normal range of motion.  Skin:    General: Skin is warm and dry.  Neurological:     Mental Status: He is alert and oriented to person, place, and time.  Psychiatric:        Mood and Affect: Mood normal.        Behavior: Behavior normal.        Thought Content: Thought content normal.        Judgment: Judgment normal.     Lab Results  Component Value Date   HGBA1C 9.0 (A) 05/17/2021   HGBA1C 7.5 (A) 02/09/2021   HGBA1C 10.3 (A) 11/09/2020    Lab Results  Component Value Date   WBC  8.5 02/09/2021   HGB 15.4 02/09/2021   HCT 45.0 02/09/2021   PLT 272 02/09/2021   GLUCOSE 87 02/09/2021   CHOL 223 (H) 02/09/2021   TRIG 188 (H) 02/09/2021   HDL 47 02/09/2021   LDLCALC 142 (H) 02/09/2021   ALT 33 02/09/2021   AST 31 02/09/2021   NA 140 02/09/2021   K 3.8 02/09/2021   CL 100 02/09/2021   CREATININE 0.74 (L) 02/09/2021   BUN 11 02/09/2021   CO2 24 02/09/2021   HGBA1C 9.0 (A) 05/17/2021     Assessment & Plan:   Corey Graham was seen today for follow-up.  Diagnoses and all orders for this visit:  Uncontrolled type 2 diabetes mellitus with hyperglycemia, without long-term current use of insulin (HCC) -     HgB A1c 9.0  -     Glucose (CBG) Added glucotrol with metformin and Dulaglutide.  Discussed  co- morbidities with uncontrol diabetes  Complications -diabetic retinopathy, (close your eyes ? What do you see nothing) nephropathy decrease in kidney function- can lead to dialysis-on a machine 3 days a week to filter your kidney, neuropathy- numbness and tinging in your hands and feet,  increase risk of heart attack and stroke, and amputation due to decrease wound healing and circulation. Decrease your risk by taking medication daily as prescribed, monitor carbohydrates- foods that are high in carbohydrates are the following rice, potatoes, breads, sugars, and pastas.  Reduction in the intake (eating) will assist in lowering your blood sugars. Exercise daily at least 30 minutes daily. -     TRUEplus Lancets 28G MISC; USE TO CHECK BLOOD SUGAR ONCE DAILY. -     Dulaglutide 1.5 MG/0.5ML SOPN; INJECT 1.5 MG INTO THE SKIN ONCE A WEEK. -     metFORMIN (GLUCOPHAGE-XR) 500 MG 24 hr tablet; TAKE 2 TABLETS (1,000 MG TOTAL) BY MOUTH 2 (TWO) TIMES DAILY WITH A MEAL. -     glipiZIDE (GLUCOTROL) 10 MG tablet; Take 1 tab by mouth twice daily with meals -     CBC with Differential/Platelet; Future  Comprehensive diabetic foot examination, type 2 DM, encounter for [facility]) Completed    Essential hypertension Counseled on blood pressure goal of less than 130/80, low-sodium, DASH diet, medication compliance, 150 minutes of moderate intensity exercise per week. Discussed medication compliance, adverse effects. -     amLODipine (NORVASC) 10 MG tablet; TAKE 1 TABLET (10 MG TOTAL) BY MOUTH DAILY. -     hydrochlorothiazide (HYDRODIURIL) 25 MG tablet; TAKE 1 TABLET (25 MG TOTAL) BY MOUTH DAILY. -  CMP14+EGFR; Future  Mixed hyperlipidemia Elevated cholesterol that can lead to heart attack and stroke. To lower your number you can decrease your fatty foods, red meat, cheese, milk and increase fiber like whole grains and veggies. You   -     fenofibrate (TRICOR) 145 MG tablet; TAKE 1 TABLET (145 MG TOTAL) BY MOUTH DAILY. -     Lipid panel; Future  Chronic bilateral low back pain without sciatica -     ibuprofen (ADVIL) 800 MG tablet; Take 1 tablet (800 mg total) by mouth every 8 (eight) hours as needed for moderate pain.  Medication refill -     amLODipine (NORVASC) 10 MG tablet; TAKE 1 TABLET (10 MG TOTAL) BY MOUTH DAILY. -     fenofibrate (TRICOR) 145 MG tablet; TAKE 1 TABLET (145 MG TOTAL) BY MOUTH DAILY. -     hydrochlorothiazide (HYDRODIURIL) 25 MG tablet; TAKE 1 TABLET (25 MG TOTAL) BY MOUTH DAILY. -     TRUEplus Lancets 28G MISC; USE TO CHECK BLOOD SUGAR ONCE DAILY. -     ibuprofen (ADVIL) 800 MG tablet; Take 1 tablet (800 mg total) by mouth every 8 (eight) hours as needed for moderate pain. -     Dulaglutide 1.5 MG/0.5ML SOPN; INJECT 1.5 MG INTO THE SKIN ONCE A WEEK. -     metFORMIN (GLUCOPHAGE-XR) 500 MG 24 hr tablet; TAKE 2 TABLETS (1,000 MG TOTAL) BY MOUTH 2 (TWO) TIMES DAILY WITH A MEAL. -     glipiZIDE (GLUCOTROL) 10 MG tablet; Take 1 tab by mouth twice daily with meals   I have discontinued Ann Lions Velazquez's guaiFENesin and albuterol. I am also having him maintain his True Metrix Meter, rosuvastatin, glucose blood, True Metrix Go Glucose Meter,  gabapentin, amLODipine, fenofibrate, hydrochlorothiazide, TRUEplus Lancets 28G, ibuprofen, Dulaglutide, metFORMIN, and glipiZIDE.  Meds ordered this encounter  Medications  . amLODipine (NORVASC) 10 MG tablet    Sig: TAKE 1 TABLET (10 MG TOTAL) BY MOUTH DAILY.    Dispense:  90 tablet    Refill:  3  . fenofibrate (TRICOR) 145 MG tablet    Sig: TAKE 1 TABLET (145 MG TOTAL) BY MOUTH DAILY.    Dispense:  90 tablet    Refill:  2  . hydrochlorothiazide (HYDRODIURIL) 25 MG tablet    Sig: TAKE 1 TABLET (25 MG TOTAL) BY MOUTH DAILY.    Dispense:  90 tablet    Refill:  3  . TRUEplus Lancets 28G MISC    Sig: USE TO CHECK BLOOD SUGAR ONCE DAILY.    Dispense:  100 each    Refill:  2  . ibuprofen (ADVIL) 800 MG tablet    Sig: Take 1 tablet (800 mg total) by mouth every 8 (eight) hours as needed for moderate pain.    Dispense:  90 tablet    Refill:  0  . Dulaglutide 1.5 MG/0.5ML SOPN    Sig: INJECT 1.5 MG INTO THE SKIN ONCE A WEEK.    Dispense:  2 mL    Refill:  2  . metFORMIN (GLUCOPHAGE-XR) 500 MG 24 hr tablet    Sig: TAKE 2 TABLETS (1,000 MG TOTAL) BY MOUTH 2 (TWO) TIMES DAILY WITH A MEAL.    Dispense:  120 tablet    Refill:  2  . glipiZIDE (GLUCOTROL) 10 MG tablet    Sig: Take 1 tab by mouth twice daily with meals    Dispense:  180 tablet    Refill:  1     Follow-up:  Return in about 3 months (around 08/17/2021) for DM/A1C- fasting labs.  The above assessment and management plan was discussed with the patient. The patient verbalized understanding of and has agreed to the management plan. Patient is aware to call the clinic if symptoms fail to improve or worsen. Patient is aware when to return to the clinic for a follow-up visit. Patient educated on when it is appropriate to go to the emergency department.   Juluis Mire, NP-C

## 2021-05-17 NOTE — Patient Instructions (Signed)
Plan de accin para la diabetes mellitus Diabetes Mellitus Action Plan Seguir un plan de accin para la diabetes es una forma de controlar sus sntomas de diabetes (diabetes mellitus). El plan se codifica con colores para ayudarlo a comprender qu acciones necesita tomar en funcin de los sntomas que est teniendo.  Si tiene sntomas pertenecientes a la zona roja, necesita buscar atencin mdica inmediatamente.  Si tiene sntomas pertenecientes a la zona amarilla, est teniendo problemas.  Si tiene sntomas pertenecientes a la zona verde, significa que se Brewing technologist. Aprender y comprender la diabetes puede Radiation protection practitioner. Siga el plan que elabor con el mdico. Conozca el rango deseado para su nivel de azcar en la sangre (glucosa), y revise su plan de tratamiento con su mdico en cada visita. El rango deseado para mi nivel de azcar en la sangre es __________________________ mg/dl. Zona roja Obtenga ayuda de inmediato si observa cualquiera de estos sntomas:  Un resultado de azcar en la sangre que est por debajo de 54 mg/dl (3 mmol/l).  Un nivel de azcar en la sangre mayor o igual que 240mg /dl (13,42mmol/l) durante 2das seguidos.  Confusin o dificultad para pensar con claridad.  Dificultad para respirar.  Malestar o fiebre durante 2 o ms [employer] no mejora.  Niveles moderados o altos de cetonas en la [place].  Sentirse cansado o sin energa. Si tiene cualquiera de los sntomas pertenecientes a la zona roja, no espere para ver si desaparecen. Solicite atencin mdica de inmediato. Comunquese con el servicio de emergencias de su localidad (911 en los [place]). No conduzca por sus propios medios [employer]. Si tiene un nivel de azcar en la sangre muy bajo (hipoglucemia grave) y no puede ingerir ningn alimento ni bebida, tal vez necesite glucagn. Asegrese de que un familiar o amigo sepa controlarle el nivel de azcar en la sangre y aplicarle glucagn. Puede  necesitar tratamiento en un hospital para esta afeccin.   Zona amarilla Si tiene alguno de los siguientes sntomas, su diabetes no est controlada y usted Product manager algunos cambios:  Un nivel de azcar en la sangre mayor o igual que 240mg /dl (13,22mmol/l) durante 2das seguidos.  Un resultado de azcar en la sangre que est por debajo de 70 mg/dl (3,9 mmol/l).  Otros sntomas de hipoglucemia, como: ? Temblores o sensacin de desvanecimiento. ? Confusin o irritabilidad. ? Sensacin de hambre. ? Latidos cardacos acelerados. Si tiene algn sntoma de la zona amarilla:  Trate la hipoglucemia comiendo o bebiendo 15 gramos de hidratos de carbono de accin rpida. Siga las regla 15:15: ? Consuma 15gramos de hidratos de carbono de accin rpida, como:  1pomo de glucosa en gel.  4comprimidos de glucosa.  4onzas (141ml) de jugo de frutas.  4onzas (140ml) de refresco comn (no diettico). ? Controle su nivel de azcar en la sangre 88minutos despus de ingerir el hidrato de carbono. ? Si este nuevo nivel de azcar en la sangre todava es igual o menor que 70mg /dl (3,23mmol/l), ingiera nuevamente 15gramos de un hidrato de carbono. ? Si el nivel de azcar en la sangre no supera los 70mg /dl (3,91mmol/l) despus de 3intentos, solicite ayuda mdica de inmediato. ? Duffy Bruce comida o un refrigerio en el transcurso de 1hora despus de que el nivel de azcar en la sangre se haya normalizado.  Siga tomando los medicamentos diarios como se lo haya indicado el mdico.  Controle su nivel de azcar en la sangre con ms frecuencia que lo hara normalmente. ? [employer]. ?  Llame al mdico si tiene problemas para mantener el nivel de azcar en la sangre dentro del rango deseado.   Zona verde Estos signos significan que se encuentra bien y puede continuar haciendo lo que est haciendo para controlar la diabetes:  Su nivel de azcar en la sangre est en el rango deseado.  En la mayora de las personas, el nivel de azcar en la sangre antes de una comida (preprandial) debera ser de 80 a 130 mg/dl (de 4.4 a 7.2mmol/l).  Se siente bien, y pueda volver a realizar las actividades diarias. Si se encuentra en la zona verde, contine controlando su diabetes como se lo haya indicado el mdico. Para hacer esto:  Siga una dieta saludable.  Haga ejercicio regularmente.  Controle su nivel de azcar en la sangre como se lo haya indicado el mdico.  Tome los medicamentos como se lo haya indicado el mdico.   Dnde buscar ms informacin  American Diabetes Association (ADA) (Asociacin Estadounidense de la Diabetes): diabetes.org  Association of Diabetes Care & Education Specialists (ADCES) (Asociacin de Especialistas en Atencin y Educacin sobre la Diabetes): diabeteseducator.org Resumen  Seguir un plan de accin para la diabetes, es una forma de controlar sus sntomas de diabetes. El plan se codifica con colores para ayudarlo a comprender qu acciones necesita tomar en funcin de los sntomas que est teniendo.  Siga el plan que elabor con el mdico. Asegrese de conocer su nivel deseado de azcar en la sangre.  Revise el plan de tratamiento con el mdico en todas las consultas. Esta informacin no tiene como fin reemplazar el consejo del mdico. Asegrese de hacerle al mdico cualquier pregunta que tenga. Document Revised: 07/14/2020 Document Reviewed: 07/14/2020 Elsevier Patient Education  2021 Elsevier Inc.  

## 2021-06-07 ENCOUNTER — Other Ambulatory Visit: Payer: Self-pay

## 2021-06-07 MED FILL — Gabapentin Cap 100 MG: ORAL | 30 days supply | Qty: 90 | Fill #0 | Status: AC

## 2021-07-21 ENCOUNTER — Other Ambulatory Visit: Payer: Self-pay

## 2021-07-24 ENCOUNTER — Other Ambulatory Visit: Payer: Self-pay

## 2021-08-16 ENCOUNTER — Other Ambulatory Visit: Payer: Self-pay

## 2021-08-16 ENCOUNTER — Ambulatory Visit (INDEPENDENT_AMBULATORY_CARE_PROVIDER_SITE_OTHER): Payer: Self-pay | Admitting: Primary Care

## 2021-08-16 ENCOUNTER — Encounter (INDEPENDENT_AMBULATORY_CARE_PROVIDER_SITE_OTHER): Payer: Self-pay | Admitting: Primary Care

## 2021-08-16 VITALS — BP 147/99 | HR 109 | Temp 97.3°F | Ht 64.0 in | Wt 169.8 lb

## 2021-08-16 DIAGNOSIS — Z760 Encounter for issue of repeat prescription: Secondary | ICD-10-CM

## 2021-08-16 DIAGNOSIS — I10 Essential (primary) hypertension: Secondary | ICD-10-CM

## 2021-08-16 DIAGNOSIS — K5900 Constipation, unspecified: Secondary | ICD-10-CM

## 2021-08-16 DIAGNOSIS — E1165 Type 2 diabetes mellitus with hyperglycemia: Secondary | ICD-10-CM

## 2021-08-16 DIAGNOSIS — E782 Mixed hyperlipidemia: Secondary | ICD-10-CM

## 2021-08-16 LAB — POCT GLYCOSYLATED HEMOGLOBIN (HGB A1C): Hemoglobin A1C: 10.2 % — AB (ref 4.0–5.6)

## 2021-08-16 LAB — GLUCOSE, POCT (MANUAL RESULT ENTRY): POC Glucose: 258 mg/dl — AB (ref 70–99)

## 2021-08-16 MED ORDER — GLIPIZIDE 10 MG PO TABS
ORAL_TABLET | ORAL | 1 refills | Status: DC
  Filled 2021-08-16: qty 60, 30d supply, fill #0
  Filled 2021-12-06 – 2021-12-15 (×2): qty 60, 30d supply, fill #1
  Filled 2022-02-12: qty 60, 30d supply, fill #0

## 2021-08-16 MED ORDER — AMLODIPINE BESYLATE 10 MG PO TABS
ORAL_TABLET | Freq: Every day | ORAL | 3 refills | Status: DC
  Filled 2021-08-16: qty 30, 30d supply, fill #0
  Filled 2021-12-06: qty 90, 90d supply, fill #1
  Filled 2021-12-15: qty 30, 30d supply, fill #1
  Filled 2022-02-12: qty 30, 30d supply, fill #0

## 2021-08-16 MED ORDER — TRULICITY 3 MG/0.5ML ~~LOC~~ SOAJ
3.0000 mg | SUBCUTANEOUS | 3 refills | Status: DC
  Filled 2021-08-16: qty 2, 28d supply, fill #0

## 2021-08-16 MED ORDER — HYDROCHLOROTHIAZIDE 25 MG PO TABS
ORAL_TABLET | Freq: Every day | ORAL | 3 refills | Status: DC
  Filled 2021-08-16: qty 30, 30d supply, fill #0
  Filled 2021-12-06 – 2021-12-15 (×2): qty 30, 30d supply, fill #1
  Filled 2022-02-12: qty 30, 30d supply, fill #0

## 2021-08-16 MED ORDER — LOSARTAN POTASSIUM 25 MG PO TABS
25.0000 mg | ORAL_TABLET | Freq: Every day | ORAL | 0 refills | Status: DC
  Filled 2021-08-16: qty 30, 30d supply, fill #0

## 2021-08-16 MED ORDER — METFORMIN HCL 1000 MG PO TABS
1000.0000 mg | ORAL_TABLET | Freq: Two times a day (BID) | ORAL | 1 refills | Status: DC
  Filled 2021-08-16: qty 60, 30d supply, fill #0
  Filled 2021-12-06 – 2021-12-15 (×2): qty 60, 30d supply, fill #1
  Filled 2022-02-12: qty 60, 30d supply, fill #0

## 2021-08-16 MED ORDER — SENNOSIDES 8.6 MG PO TABS
1.0000 | ORAL_TABLET | Freq: Every day | ORAL | 1 refills | Status: DC
Start: 2021-08-16 — End: 2022-03-28
  Filled 2021-08-16: qty 90, 90d supply, fill #0

## 2021-08-16 NOTE — Progress Notes (Signed)
[facility]   Subjective:  Patient ID: Corey Graham, male    DOB: 06/04/1976  Age: 45 y.o. MRN: [id number]  CC: Diabetes   HPI Corey Graham is 45 year old Hispanic obese male Corey Graham [phone] interpretor )presents for follow-up of diabetes and Hypertension . Patient does check blood sugar at home  Compliant with meds - Yes but A1C increased  Checking CBGs? 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 - Yes Neuropathy ? - No Hypoglycemic events - No  - Recovers with :   Pertinent ROS:  Polyuria - No Polydipsia - No Vision problems - Yes - blurry  Blood pressure is elevated 147/99 . Denies shortness of breath, headaches, chest pain or lower extremity edema  Medications as noted below. Taking them regularly without complication/adverse reaction being reported today.   History Corey Graham has a past medical history of Diabetes mellitus without complication ([place]) (0300), Essential hypertension (09/09/2017), and Low back pain (06/24/2017).   He has a past surgical history that includes Hernia repair.   His family history is not on file.He reports that he has never smoked. He has never used smokeless tobacco. He reports current alcohol use. He reports that he does not use drugs.  Current Outpatient Medications on File Prior to Visit  Medication Sig Dispense Refill   Blood Glucose Monitoring Suppl (TRUE METRIX GO GLUCOSE METER) w/Device KIT USE TO CHECK BLOOD SUGAR ONCE DAILY. 1 kit 0   fenofibrate (TRICOR) 145 MG tablet TAKE 1 TABLET (145 MG TOTAL) BY MOUTH DAILY. 90 tablet 2   gabapentin (NEURONTIN) 100 MG capsule TAKE 1 CAPSULE (100 MG TOTAL) BY MOUTH 3 (THREE) TIMES DAILY. 90 capsule 3   glucose blood test strip USE TO CHECK BLOOD SUGAR ONCE DAILY. 100 strip 2   rosuvastatin (CRESTOR) 20 MG tablet TAKE 1 TABLET (20 MG TOTAL) BY MOUTH DAILY. 30 tablet 2   TRUEplus Lancets  28G MISC USE TO CHECK BLOOD SUGAR ONCE DAILY. 100 each 2   [DISCONTINUED] ezetimibe (ZETIA) 10 MG tablet Take 1 tablet (10 mg total) by mouth daily. 90 tablet 3   [DISCONTINUED] pravastatin (PRAVACHOL) 80 MG tablet Take 1 tablet (80 mg total) by mouth daily. 90 tablet 1   No current facility-administered medications on file prior to visit.    ROS Review of Systems  Eyes:  Positive for visual disturbance.  All other systems reviewed and are negative.  Objective:  BP (!) 147/99 (BP Location: Right Arm, Patient Position: Sitting, Cuff Size: Normal)   Pulse (!) 109   Temp (!) 97.3 F (36.3 C) (Temporal)   Ht _0  (1.626 m)   Wt 169 lb 12.8 oz (77 kg)   SpO2 96%   BMI 29.15 kg/m   BP Readings from Last 3 Encounters:  08/16/21 (!) 147/99  05/17/21 125/87  02/09/21 126/85    Wt Readings from Last 3 Encounters:  08/16/21 169 lb 12.8 oz (77 kg)  05/17/21 168 lb 12.8 oz (76.6 kg)  02/09/21 171 lb 6.4 oz (77.7 kg)    Physical Exam General: Vital signs reviewed.  Patient is well-developed and well-nourished, overweight (obese- comorbidity) in no acute distress and cooperative with exam.  Head: Normocephalic and atraumatic. Eyes: EOMI, conjunctivae normal, no scleral icterus.  Neck: Supple, trachea midline, normal ROM, no JVD, masses, thyromegaly, or carotid bruit present.  Cardiovascular: RRR, S1 normal, S2 normal, no murmurs, gallops, or rubs.  Pulmonary/Chest: Clear to auscultation bilaterally, no wheezes, rales, or rhonchi. Abdominal: Soft, non-tender, non-distended, BS +, no masses, organomegaly, or guarding present.  Musculoskeletal: No joint deformities, erythema, or stiffness, ROM full and nontender. Extremities: No lower extremity edema bilaterally,  pulses symmetric and intact bilaterally. No cyanosis or clubbing. Neurological: A&O x3, Strength is normal and symmetric bilaterally, cranial nerve II-XII are grossly intact, no focal motor deficit, sensory intact to light touch  bilaterally.  Skin: Warm, dry and intact. No rashes or erythema. Psychiatric: Normal mood and affect. speech and behavior is normal. Cognition and memory are normal.   Lab Results  Component Value Date   HGBA1C 10.2 (A) 08/16/2021   HGBA1C 9.0 (A) 05/17/2021   HGBA1C 7.5 (A) 02/09/2021    Lab Results  Component Value Date   WBC 8.5 02/09/2021   HGB 15.4 02/09/2021   HCT 45.0 02/09/2021   PLT 272 02/09/2021   GLUCOSE 87 02/09/2021   CHOL 223 (H) 02/09/2021   TRIG 188 (H) 02/09/2021   HDL 47 02/09/2021   LDLCALC 142 (H) 02/09/2021   ALT 33 02/09/2021   AST 31 02/09/2021   NA 140 02/09/2021   K 3.8 02/09/2021   CL 100 02/09/2021   CREATININE 0.74 (L) 02/09/2021   BUN 11 02/09/2021   CO2 24 02/09/2021   HGBA1C 10.2 (A) 08/16/2021     Assessment & Plan:   Corey Graham was seen today for diabetes.  Diagnoses and all orders for this visit:  Uncontrolled type 2 diabetes mellitus with hyperglycemia, without long-term current use of insulin (HCC)  Goal of therapy: Less than 6.5 hemoglobin A1c. Did not understand how A1C went up when he is taking medication watching carbs and exercising. He than show me a picture of a drink he drinks daily several times a day. The guys from work were drinking them.  XST CocoWater Hydration Drink Mix - Strawberry ..- google drink and read the ingredients added sugar although stated natural . He will stop drinking them and drink water instead Decrease foods that are high in carbohydrates are the following rice, potatoes, breads, sugars, and pastas.  Reduction in the intake (eating) will assist in lowering your blood sugars.  -     HgB A1c 10.2 -     Glucose (CBG) -     glipiZIDE (GLUCOTROL) 10 MG tablet; Take 1 tab by mouth twice daily with meals  Mixed hyperlipidemia  Healthy lifestyle diet of fruits vegetables fish nuts whole grains and low saturated fat . Foods high in cholesterol or liver, fatty meats,cheese, butter avocados, nuts and seeds, chocolate  and fried foods.  Essential hypertension Counseled on blood pressure goal of less than 130/80, low-sodium, DASH diet, medication compliance, 150 minutes of moderate intensity exercise per week. Discussed medication compliance, adverse effects.  -     amLODipine (NORVASC) 10 MG tablet; TAKE 1 TABLET (10 MG TOTAL) BY MOUTH DAILY. -     hydrochlorothiazide (HYDRODIURIL) 25 MG tablet; TAKE 1 TABLET (25 MG TOTAL) BY MOUTH DAILY.  Medication refill -     amLODipine (NORVASC) 10 MG tablet; TAKE 1 TABLET (10 MG TOTAL) BY MOUTH DAILY. -     glipiZIDE (GLUCOTROL) 10 MG tablet; Take 1 tab by mouth twice daily with meals -     hydrochlorothiazide (HYDRODIURIL) 25 MG tablet; TAKE 1 TABLET (25 MG TOTAL) BY MOUTH DAILY.  Constipation, unspecified constipation type -     senna (SENOKOT) 8.6 MG tablet; Take 1 tablet (8.6 mg total) by mouth  daily.  Other orders -     Dulaglutide (TRULICITY) 3 AC/1.6SA SOPN; Inject 3 mg as directed once a week. -     losartan (COZAAR) 25 MG tablet; Take 1 tablet (25 mg total) by mouth daily. -     metFORMIN (GLUCOPHAGE) 1000 MG tablet; Take 1 tablet (1,000 mg total) by mouth 2 (two) times daily with a meal.    I have discontinued Ann Lions Velazquez's ibuprofen, Dulaglutide, and metFORMIN. I am also having him start on Trulicity, losartan, metFORMIN, and senna. Additionally, I am having him maintain his rosuvastatin, glucose blood, True Metrix Go Glucose Meter, gabapentin, fenofibrate, TRUEplus Lancets 28G, amLODipine, glipiZIDE, and hydrochlorothiazide.  Meds ordered this encounter  Medications   amLODipine (NORVASC) 10 MG tablet    Sig: TAKE 1 TABLET (10 MG TOTAL) BY MOUTH DAILY.    Dispense:  90 tablet    Refill:  3   Dulaglutide (TRULICITY) 3 YT/0.1SW SOPN    Sig: Inject 3 mg as directed once a week.    Dispense:  0.5 mL    Refill:  3   glipiZIDE (GLUCOTROL) 10 MG tablet    Sig: Take 1 tab by mouth twice daily with meals    Dispense:  180 tablet     Refill:  1   hydrochlorothiazide (HYDRODIURIL) 25 MG tablet    Sig: TAKE 1 TABLET (25 MG TOTAL) BY MOUTH DAILY.    Dispense:  90 tablet    Refill:  3   losartan (COZAAR) 25 MG tablet    Sig: Take 1 tablet (25 mg total) by mouth daily.    Dispense:  90 tablet    Refill:  0   metFORMIN (GLUCOPHAGE) 1000 MG tablet    Sig: Take 1 tablet (1,000 mg total) by mouth 2 (two) times daily with a meal.    Dispense:  180 tablet    Refill:  1   senna (SENOKOT) 8.6 MG tablet    Sig: Take 1 tablet (8.6 mg total) by mouth daily.    Dispense:  90 tablet    Refill:  1     Follow-up:   Return in about 3 months (around 11/15/2021) for [facility] 1 month pcp 3 months Dm.  The above assessment and management plan was discussed with the patient. The patient verbalized understanding of and has agreed to the management plan. Patient is aware to call the clinic if symptoms fail to improve or worsen. Patient is aware when to return to the clinic for a follow-up visit. Patient educated on when it is appropriate to go to the emergency department.   Juluis Mire, NP-C

## 2021-08-16 NOTE — Patient Instructions (Signed)
Estreimiento, en adultos Constipation, Adult El estreimiento se produce cuando una persona tiene problemas para defecar (hacer sus deposiciones). Cuando tiene [employer], el nmero de deposiciones es inferior a 3 veces por semana. Las deposiciones (heces) tambin pueden ser secas, duras o ms voluminosas de lo normal. Siga estas instrucciones en su casa: Comida y bebida  Consuma alimentos con alto contenido de [place], por ejemplo: Frutas y verduras frescas. Cereales integrales. Frijoles. Consuma menos alimentos bajos en fibra y con alto contenido de grasas y International aid/development worker, como: Papas fritas. Hamburguesas. Galletas dulces. Caramelos. Gaseosas. Beba suficiente lquido para Radio producer pis (orina) de color amarillo plido. Instrucciones generales Haga actividad fsica con regularidad o segn las indicaciones del mdico. Intente practicar 150 minutos de actividad fsica por semana. Vaya al bao cuando sienta la necesidad de defecar. No se aguante las ganas. Use los medicamentos de venta libre y los recetados solamente como se lo haya indicado el mdico. Estos incluyen los suplementos de [place]. Cuando est defecando: Respire profundamente mientras relaja la parte inferior del vientre (abdomen). Relaje el suelo plvico. El suelo plvico est formado por un grupo de msculos que sostienen el recto, la vejiga y los intestinos (as como el tero en las mujeres). Controle su afeccin para Insurance risk surveyor cambio. Visite al mdico si advierte lo indicado. Concurra a todas las visitas de [address] se lo haya indicado el mdico. Esto es importante. Comunquese con un mdico si: Su dolor empeora. Tiene fiebre. No ha defecado por 4 das. Vomita. No tiene hambre. Pierde peso. Tiene sangrado por la abertura entre las nalgas (ano). Las deposiciones son delgadas como un lpiz. Solicite ayuda de inmediato si: Lance Muss, y los sntomas empeoran de repente. Tiene prdida de materia fecal u  observa [employer]. Siente el vientre ms duro o ms grande de lo normal (hinchado). Siente un dolor muy intenso en el vientre. Se siente mareado o se desmaya. Resumen El estreimiento se produce cuando una persona defeca menos de 3 veces a la semana, tiene problemas para defecar o las heces son secas, duras o ms grandes que lo normal. Consuma alimentos con un alto contenido de [place]. Beba suficiente lquido para Radio producer pis (orina) de color amarillo plido. Use los medicamentos de venta libre y los recetados solamente como se lo haya indicado el mdico. Estos incluyen los suplementos de [place]. Esta informacin no tiene Theme park manager el consejo del mdico. Asegrese de hacerle al mdico cualquier pregunta que tenga. Document Revised: 01/08/2020 Document Reviewed: 01/08/2020 Elsevier Patient Education  2022 [employer].

## 2021-08-17 ENCOUNTER — Other Ambulatory Visit: Payer: Self-pay

## 2021-08-18 ENCOUNTER — Other Ambulatory Visit: Payer: Self-pay

## 2021-08-23 ENCOUNTER — Ambulatory Visit: Payer: Self-pay

## 2021-08-30 ENCOUNTER — Other Ambulatory Visit: Payer: Self-pay

## 2021-08-30 ENCOUNTER — Other Ambulatory Visit (HOSPITAL_COMMUNITY)
Admission: RE | Admit: 2021-08-30 | Discharge: 2021-08-30 | Disposition: A | Discharge status: Home / Self Care | Payer: Self-pay | Source: Ambulatory Visit | Attending: Physician Assistant | Admitting: Physician Assistant

## 2021-08-30 ENCOUNTER — Ambulatory Visit: Payer: Self-pay | Admitting: Physician Assistant

## 2021-08-30 VITALS — BP 138/89 | HR 87 | Temp 98.2°F | Resp 18 | Ht 65.0 in | Wt 168.0 lb

## 2021-08-30 DIAGNOSIS — Z113 Encounter for screening for infections with a predominantly sexual mode of transmission: Secondary | ICD-10-CM | POA: Insufficient documentation

## 2021-08-30 DIAGNOSIS — B379 Candidiasis, unspecified: Secondary | ICD-10-CM

## 2021-08-30 DIAGNOSIS — Z789 Other specified health status: Secondary | ICD-10-CM

## 2021-08-30 DIAGNOSIS — E1165 Type 2 diabetes mellitus with hyperglycemia: Secondary | ICD-10-CM

## 2021-08-30 DIAGNOSIS — B3749 Other urogenital candidiasis: Secondary | ICD-10-CM | POA: Insufficient documentation

## 2021-08-30 LAB — POCT URINALYSIS DIP (CLINITEK)
Bilirubin, UA: NEGATIVE
Blood, UA: NEGATIVE
Glucose, UA: 500 mg/dL — AB
Ketones, POC UA: NEGATIVE mg/dL
Leukocytes, UA: NEGATIVE
Nitrite, UA: NEGATIVE
POC PROTEIN,UA: 30 — AB
Spec Grav, UA: 1.015 (ref 1.010–1.025)
Urobilinogen, UA: 0.2 E.U./dL
pH, UA: 7 (ref 5.0–8.0)

## 2021-08-30 MED ORDER — FLUCONAZOLE 150 MG PO TABS
150.0000 mg | ORAL_TABLET | Freq: Once | ORAL | 0 refills | Status: AC
  Filled 2021-08-30: qty 2, 2d supply, fill #0

## 2021-08-30 MED ORDER — NYSTATIN 100000 UNIT/GM EX CREA
1.0000 "application " | TOPICAL_CREAM | Freq: Two times a day (BID) | CUTANEOUS | 0 refills | Status: DC
  Filled 2021-08-30: qty 30, 15d supply, fill #0

## 2021-08-30 NOTE — Progress Notes (Signed)
Patient reports for infection check of yeast due to high CBG levels. Patient reports itching for 1 week at the tip. Patient has eaten today and patient has not taken medication today. Patient denies pain at this time.

## 2021-08-30 NOTE — Patient Instructions (Addendum)
You will take one diflucan today and then another one in one week if needed.  You will use the prescribed cream twice a day until resolved.  Please let us know if there is anything else we can do for you  Roney Jaffe, PA-C Physician Assistant [facility] [email or web address]  Candidiasis genital en los hombres Genital Yeast Infection, Male En los hombres, la candidiasis genital es una afeccin que causa dolor, hinchazn y enrojecimiento (inflamacin) de la cabeza del pene (glande). La candidiasis genital puede contagiarse a travs del contacto sexual, pero tambin puede aparecer sin este tipo de contacto. Si la infeccin no se trata de [employer], es probable que reaparezca. Cules son las causas? La causa de la afeccin es un cambio en el equilibrio normal de los hongos y las bacterias que viven en la piel. Esta alteracin deriva en el crecimiento excesivo de los hongos, lo que causa la inflamacin. Muchos tipos de hongos pueden causar esta infeccin, pero la cndida es el ms frecuente. Qu incrementa el riesgo? Los siguientes factores pueden hacer que usted sea ms propenso a tener esta afeccin: Tomar antibiticos. Tener diabetes. Tener una pareja sexual que tiene la infeccin. No estar circuncidado. Tener el sistema de defensa del cuerpo (sistema inmunitario) debilitado. Tomar medicamentos con corticoesteroides durante un perodo prologando. Tener una higiene deficiente. Cules son los signos o los sntomas? Los sntomas de esta afeccin incluyen los siguientes: Picazn en la ingle y el pene. Piel del pene seca, enrojecida o agrietada. Hinchazn de la zona genital. Dolor al Beatrix Shipper o dificultad para Geographical information systems officer. Secrecin espesa con olor ftido en el pene. Cmo se diagnostica? Esta afeccin se puede diagnosticar en funcin de lo siguiente: Sus antecedentes mdicos. Un examen fsico. Tambin pueden hacerle estudios, por  ejemplo: Prueba de [place] de secrecin del pene. Anlisis de [place]. Anlisis de [place]. Cmo se trata? Esta afeccin se trata con lo siguiente: Cremas o medicamentos antimicticos. Los medicamentos antimicticos pueden ser recetados por el mdico o pueden ser de [place]. Autocuidado en casa. En el caso de los hombres no circuncidados, se puede recomendar la circuncisin para Chief Operating Officer las infecciones recurrentes y difciles de tratar. Siga estas indicaciones en su casa: Medicamentos  Tome o aplquese los medicamentos de venta libre y los recetados solamente como se lo haya indicado el mdico. Tome los medicamentos antimicticos como se lo haya indicado el mdico. No deje de tomar el medicamento aunque comience a sentirse mejor. Autocuidado Lvese el pene [employer] con agua y [place]. Si no est circuncidado, tire el prepucio hacia atrs para lavarlo. Squese bien el pene despus del lavado. Use ropa interior transpirable de algodn. Mantenga la ropa interior seca y limpia. Instrucciones generales No tenga relaciones sexuales hasta que el mdico lo autorice. Comunique a su pareja sexual que sufre candidiasis. Esta persona debe someterse a un tratamiento aunque no presente sntomas. Si tiene diabetes, mantenga los [employer] de azcar en la sangre dentro del [place]. Concurra a todas las visitas de control como se lo haya indicado el mdico. Esto es importante. Comunquese con un mdico si: Tiene fiebre. Tiene sntomas que desaparecen y luego se vuelven a Environmental consultant. No mejora con el tratamiento. Tiene sntomas que empeoran. Tiene nuevos sntomas. Solicite ayuda de inmediato si: Tiene mucha hinchazn e inflamacin y no Education officer, environmental. Resumen En los hombres, la candidiasis genital es una afeccin que causa dolor, hinchazn y enrojecimiento (inflamacin) de la cabeza del pene (glande). La causa de la afeccin es un  cambio en el equilibrio normal de los hongos y las  bacterias que viven en la piel. Esta alteracin deriva en el crecimiento excesivo de los hongos, lo que causa la inflamacin. Generalmente, la candidiasis genital se contagia a travs del contacto sexual, pero puede aparecer sin este tipo de contacto. Por ejemplo, usted puede tener ms probabilidades de desarrollar esta infeccin si toma antibiticos o corticoesteroides, tiene diabetes, no est circuncidado, tiene un sistema inmunitario debilitado o tiene hbitos de higiene deficientes. El tratamiento de esta afeccin incluye el uso de cremas o comprimidos antimicticos junto con el autocuidado Facilities manager. Esta informacin no tiene Theme park manager el consejo del mdico. Asegrese de hacerle al mdico cualquier pregunta que tenga. Document Revised: 02/05/2018 Document Reviewed: 02/05/2018 Elsevier Patient Education  2022 [employer].

## 2021-08-30 NOTE — Progress Notes (Signed)
Established Patient Office Visit  Subjective:  Patient ID: Corey Graham, male    DOB: 05-May-1976  Age: 45 y.o. MRN: [id number]  CC:  Chief Complaint  Patient presents with   Penis Pain    HPI Corey Graham reports that he has been having slightly swollen foreskin, states when he tries to retract it it does become very red and will crack and bleed.  Reports that he is concerned he may have a yeast infection due to his diabetes.  Reports that he has been having penile itching, denies dysuria, penile discharge.  States that he recently saw his PCP and his A1C was 10.2 (08/15/21) and was started on new medications, is unsure of what each medication is for but does endorse that he has been taking them as directed.  Due to language barrier, an interpreter was present during the history-taking and subsequent discussion (and for part of the physical exam) with this patient.     Past Medical History:  Diagnosis Date   Diabetes mellitus without complication ([place]) 6283   Essential hypertension 09/09/2017   Low back pain 06/24/2017    Past Surgical History:  Procedure Laterality Date   HERNIA REPAIR      History reviewed. No pertinent family history.  Social History   Socioeconomic History   Marital status: Married    Spouse name: Not on file   Number of children: 3   Years of education: Not on file   Highest education level: Not on file  Occupational History   Not on file  Tobacco Use   Smoking status: Never   Smokeless tobacco: Never  Substance and Sexual Activity   Alcohol use: Yes    Comment: infrequent   Drug use: No   Sexual activity: Yes  Other Topics Concern   Not on file  Social History Narrative   [place] at home with wife and 3 children   Social Determinants of Health   Financial Resource Strain: Not on file  Food Insecurity: Not on file  Transportation Needs: Not on file  Physical Activity: Not on file  Stress: Not on file  Social  Connections: Not on file  Intimate Partner Violence: Not on file    Outpatient Medications Prior to Visit  Medication Sig Dispense Refill   amLODipine (NORVASC) 10 MG tablet TAKE 1 TABLET (10 MG TOTAL) BY MOUTH DAILY. 90 tablet 3   Blood Glucose Monitoring Suppl (TRUE METRIX GO GLUCOSE METER) w/Device KIT USE TO CHECK BLOOD SUGAR ONCE DAILY. 1 kit 0   Dulaglutide (TRULICITY) 3 TD/1.7OH SOPN Inject 3 mg as directed once a week. 0.5 mL 3   fenofibrate (TRICOR) 145 MG tablet TAKE 1 TABLET (145 MG TOTAL) BY MOUTH DAILY. 90 tablet 2   gabapentin (NEURONTIN) 100 MG capsule TAKE 1 CAPSULE (100 MG TOTAL) BY MOUTH 3 (THREE) TIMES DAILY. 90 capsule 3   glipiZIDE (GLUCOTROL) 10 MG tablet Take 1 tab by mouth twice daily with meals 180 tablet 1   glucose blood test strip USE TO CHECK BLOOD SUGAR ONCE DAILY. 100 strip 2   hydrochlorothiazide (HYDRODIURIL) 25 MG tablet TAKE 1 TABLET (25 MG TOTAL) BY MOUTH DAILY. 90 tablet 3   losartan (COZAAR) 25 MG tablet Take 1 tablet (25 mg total) by mouth daily. 90 tablet 0   metFORMIN (GLUCOPHAGE) 1000 MG tablet Take 1 tablet (1,000 mg total) by mouth 2 (two) times daily with a meal. 180 tablet 1   rosuvastatin (CRESTOR) 20 MG tablet TAKE  1 TABLET (20 MG TOTAL) BY MOUTH DAILY. 30 tablet 2   senna (SENOKOT) 8.6 MG tablet Take 1 tablet (8.6 mg total) by mouth daily. 90 tablet 1   TRUEplus Lancets 28G MISC USE TO CHECK BLOOD SUGAR ONCE DAILY. 100 each 2   No facility-administered medications prior to visit.    No Known Allergies  ROS Review of Systems  Constitutional:  Negative for chills and fever.  HENT: Negative.    Eyes: Negative.   Respiratory:  Negative for shortness of breath.   Cardiovascular:  Negative for chest pain.  Gastrointestinal:  Negative for abdominal pain and nausea.  Endocrine: Negative.   Genitourinary:  Negative for dysuria, hematuria, penile discharge and penile pain.  Musculoskeletal:  Negative for back pain.  Skin: Negative.    Allergic/Immunologic: Negative.   Neurological: Negative.   Hematological: Negative.   Psychiatric/Behavioral: Negative.       Objective:    Physical Exam Vitals and nursing note reviewed. Exam conducted with a chaperone present.  Constitutional:      Appearance: Normal appearance.  HENT:     Head: Normocephalic and atraumatic.     Right Ear: External ear normal.     Left Ear: External ear normal.     Nose: Nose normal.     Mouth/Throat:     Mouth: Mucous membranes are moist.     Pharynx: Oropharynx is clear.  Eyes:     Extraocular Movements: Extraocular movements intact.     Conjunctiva/sclera: Conjunctivae normal.     Pupils: Pupils are equal, round, and reactive to light.  Cardiovascular:     Rate and Rhythm: Normal rate and regular rhythm.     Pulses: Normal pulses.     Heart sounds: Normal heart sounds.  Pulmonary:     Effort: Pulmonary effort is normal.     Breath sounds: Normal breath sounds.  Genitourinary:    Penis: Uncircumcised. Erythema present. No phimosis, paraphimosis, discharge, swelling or lesions.      Testes: Normal.     Comments: Crusting on glans penis Musculoskeletal:        General: Normal range of motion.     Cervical back: Normal range of motion and neck supple.  Skin:    General: Skin is warm and dry.  Neurological:     General: No focal deficit present.     Mental Status: He is alert and oriented to person, place, and time.  Psychiatric:        Mood and Affect: Mood normal.        Behavior: Behavior normal.        Thought Content: Thought content normal.        Judgment: Judgment normal.    BP 138/89 (BP Location: Left Arm, Patient Position: Sitting, Cuff Size: Normal)   Pulse 87   Temp 98.2 F (36.8 C) (Oral)   Resp 18   Ht 5' 5" (1.651 m)   Wt 168 lb (76.2 kg)   SpO2 99%   BMI 27.96 kg/m  Wt Readings from Last 3 Encounters:  08/30/21 168 lb (76.2 kg)  08/16/21 169 lb 12.8 oz (77 kg)  05/17/21 168 lb 12.8 oz (76.6 kg)      Health Maintenance Due  Topic Date Due   OPHTHALMOLOGY EXAM  Never done   HIV Screening  Never done   COVID-19 Vaccine (3 - Booster for Pfizer series) 12/06/2020   INFLUENZA VACCINE  07/17/2021    There are no preventive care reminders to display  for this patient.  No results found for: TSH Lab Results  Component Value Date   WBC 8.5 02/09/2021   HGB 15.4 02/09/2021   HCT 45.0 02/09/2021   MCV 87 02/09/2021   PLT 272 02/09/2021   Lab Results  Component Value Date   NA 140 02/09/2021   K 3.8 02/09/2021   CO2 24 02/09/2021   GLUCOSE 87 02/09/2021   BUN 11 02/09/2021   CREATININE 0.74 (L) 02/09/2021   BILITOT 0.3 02/09/2021   ALKPHOS 102 02/09/2021   AST 31 02/09/2021   ALT 33 02/09/2021   PROT 7.7 02/09/2021   ALBUMIN 4.8 02/09/2021   CALCIUM 9.6 02/09/2021   Lab Results  Component Value Date   CHOL 223 (H) 02/09/2021   Lab Results  Component Value Date   HDL 47 02/09/2021   Lab Results  Component Value Date   LDLCALC 142 (H) 02/09/2021   Lab Results  Component Value Date   TRIG 188 (H) 02/09/2021   Lab Results  Component Value Date   CHOLHDL 4.7 02/09/2021   Lab Results  Component Value Date   HGBA1C 10.2 (A) 08/16/2021      Assessment & Plan:   Problem List Items Addressed This Visit       Endocrine   Uncontrolled type 2 diabetes mellitus with hyperglycemia, without long-term current use of insulin (HCC)     Musculoskeletal and Integument   Yeast dermatitis of penis - Primary   Relevant Medications   fluconazole (DIFLUCAN) 150 MG tablet   nystatin cream (MYCOSTATIN)   Other Relevant Orders   Urine cytology ancillary only     Other   Uncircumcised male   Other Visit Diagnoses     Yeast infection       Relevant Medications   fluconazole (DIFLUCAN) 150 MG tablet   nystatin cream (MYCOSTATIN)   Other Relevant Orders   POCT URINALYSIS DIP (CLINITEK) (Completed)       Meds ordered this encounter  Medications   fluconazole  (DIFLUCAN) 150 MG tablet    Sig: Take 1 tablet (150 mg total) by mouth once for 1 dose. May repeat in one week if needed    Dispense:  2 tablet    Refill:  0    Order Specific Question:   Supervising Provider    Answer:   WRIGHT, PATRICK E [[id number]]   nystatin cream (MYCOSTATIN)    Sig: Apply 1 application topically 2 (two) times daily.    Dispense:  30 g    Refill:  0    Order Specific Question:   Supervising Provider    Answer:   WRIGHT, PATRICK E [[id number]]  1. Yeast dermatitis of penis UA positive for glucose, negative for urinary tract infection.  Based on clinical presentation, will treat with Diflucan, and nystatin cream.  Patient education given on supportive care, red flags for prompt reevaluation. - Urine cytology ancillary only - fluconazole (DIFLUCAN) 150 MG tablet; Take 1 tablet (150 mg total) by mouth once for 1 dose. May repeat in one week if needed  Dispense: 2 tablet; Refill: 0 - nystatin cream (MYCOSTATIN); Apply 1 application topically 2 (two) times daily.  Dispense: 30 g; Refill: 0  2. Uncontrolled type 2 diabetes mellitus with hyperglycemia, without long-term current use of insulin (HCC) A1c was last checked on August 16, 2021, 10.2.  Nurse went through patient's list of medications on his AVS and described what each medication he was taking was for.  Patient appreciative  3. Yeast   infection  - POCT URINALYSIS DIP (CLINITEK)   I have reviewed the patient's medical history (PMH, PSH, Social History, Family History, Medications, and allergies) , and have been updated if relevant. I spent 20 minutes reviewing chart and  face to face time with patient.     Follow-up: Return if symptoms worsen or fail to improve.    Loraine Grip Mayers, PA-C

## 2021-09-01 LAB — URINE CYTOLOGY ANCILLARY ONLY
Bacterial Vaginitis-Urine: NEGATIVE
Candida Urine: POSITIVE — AB
Chlamydia: NEGATIVE
Comment: NEGATIVE
Comment: NEGATIVE
Comment: NORMAL
Neisseria Gonorrhea: NEGATIVE
Trichomonas: NEGATIVE

## 2021-09-04 ENCOUNTER — Ambulatory Visit: Payer: Self-pay

## 2021-09-04 ENCOUNTER — Telehealth: Payer: Self-pay | Admitting: *Deleted

## 2021-09-04 NOTE — Telephone Encounter (Signed)
Medical Assistant used [employer] to contact patient.  Interpreter Name: Emeline Gins Interpreter #: [phone] Patient is aware of yeast being noted and being treated by medication prescribed the day of his visit.

## 2021-09-04 NOTE — Telephone Encounter (Signed)
-----   Message from Roney Jaffe, [place] sent at 09/04/2021  1:32 PM EDT ----- Please call patient and let him know that his urine culture did show yeast, this should have been resolved with the medication that was prescribed.

## 2021-09-13 ENCOUNTER — Ambulatory Visit: Payer: Self-pay | Admitting: Pharmacist

## 2021-09-15 ENCOUNTER — Other Ambulatory Visit: Payer: Self-pay

## 2021-09-15 ENCOUNTER — Ambulatory Visit: Payer: Self-pay | Attending: Primary Care

## 2021-09-26 ENCOUNTER — Ambulatory Visit: Payer: Self-pay | Admitting: Pharmacist

## 2021-10-04 ENCOUNTER — Other Ambulatory Visit: Payer: Self-pay

## 2021-10-24 ENCOUNTER — Other Ambulatory Visit (INDEPENDENT_AMBULATORY_CARE_PROVIDER_SITE_OTHER): Payer: Self-pay | Admitting: Primary Care

## 2021-10-24 ENCOUNTER — Other Ambulatory Visit: Payer: Self-pay

## 2021-10-24 NOTE — Telephone Encounter (Signed)
Sent to PCP ?

## 2021-10-25 ENCOUNTER — Other Ambulatory Visit: Payer: Self-pay

## 2021-10-25 MED ORDER — TRULICITY 3 MG/0.5ML ~~LOC~~ SOAJ
3.0000 mg | SUBCUTANEOUS | 3 refills | Status: DC
  Filled 2021-10-25 – 2022-02-12 (×3): qty 2, 28d supply, fill #0
  Filled 2022-04-20: qty 2, 28d supply, fill #1

## 2021-11-07 ENCOUNTER — Other Ambulatory Visit: Payer: Self-pay

## 2021-11-07 ENCOUNTER — Other Ambulatory Visit (INDEPENDENT_AMBULATORY_CARE_PROVIDER_SITE_OTHER): Payer: Self-pay | Admitting: Primary Care

## 2021-11-07 DIAGNOSIS — I10 Essential (primary) hypertension: Secondary | ICD-10-CM

## 2021-11-07 MED ORDER — LOSARTAN POTASSIUM 25 MG PO TABS
25.0000 mg | ORAL_TABLET | Freq: Every day | ORAL | 0 refills | Status: DC
  Filled 2021-11-07 – 2021-12-15 (×3): qty 30, 30d supply, fill #0

## 2021-11-14 ENCOUNTER — Other Ambulatory Visit: Payer: Self-pay

## 2021-11-15 ENCOUNTER — Ambulatory Visit (INDEPENDENT_AMBULATORY_CARE_PROVIDER_SITE_OTHER): Payer: Self-pay | Admitting: Primary Care

## 2021-11-29 ENCOUNTER — Other Ambulatory Visit: Payer: Self-pay

## 2021-11-29 ENCOUNTER — Encounter (INDEPENDENT_AMBULATORY_CARE_PROVIDER_SITE_OTHER): Payer: Self-pay | Admitting: Primary Care

## 2021-11-29 ENCOUNTER — Ambulatory Visit (INDEPENDENT_AMBULATORY_CARE_PROVIDER_SITE_OTHER): Payer: Self-pay | Admitting: Primary Care

## 2021-11-29 VITALS — BP 149/93 | HR 82 | Temp 97.9°F | Ht 61.0 in | Wt 170.6 lb

## 2021-11-29 DIAGNOSIS — I10 Essential (primary) hypertension: Secondary | ICD-10-CM

## 2021-11-29 DIAGNOSIS — E1165 Type 2 diabetes mellitus with hyperglycemia: Secondary | ICD-10-CM

## 2021-11-29 DIAGNOSIS — Z23 Encounter for immunization: Secondary | ICD-10-CM

## 2021-11-29 LAB — POCT GLYCOSYLATED HEMOGLOBIN (HGB A1C): Hemoglobin A1C: 10.3 % — AB (ref 4.0–5.6)

## 2021-11-29 LAB — GLUCOSE, POCT (MANUAL RESULT ENTRY): POC Glucose: 204 mg/dl — AB (ref 70–99)

## 2021-11-29 NOTE — Progress Notes (Signed)
Subjective:  Patient ID: Corey Graham, male    DOB: 02/21/1976  Age: 45 y.o. MRN: [id number]  CC: Diabetes   HPI Chrishawn Boley presents forFollow-up of diabetes. (Interpreter Rosaria Ferries [phone])  Patient does not check blood sugar at home  Compliant with meds - No Checking CBGs? No  Fasting avg -   Postprandial average -  Exercising regularly? - Yes Watching carbohydrate intake? - No Neuropathy ? - No Hypoglycemic events - No  - Recovers with :   Pertinent ROS:  Polyuria - No Polydipsia - No Vision problems - No Management of HTN- Denies shortness of breath, headaches, chest pain or lower extremity edema - elevated noncompliant with medication ) Medications as noted below. Taking them regularly without complication/adverse reaction being reported today.   History Kalik has a past medical history of Diabetes mellitus without complication ([place]) (6568), Essential hypertension (09/09/2017), and Low back pain (06/24/2017).   He has a past surgical history that includes Hernia repair.   His family history is not on file.He reports that he has never smoked. He has never used smokeless tobacco. He reports current alcohol use. He reports that he does not use drugs.   Current Outpatient Medications on File Prior to Visit  Medication Sig Dispense Refill   amLODipine (NORVASC) 10 MG tablet TAKE 1 TABLET (10 MG TOTAL) BY MOUTH DAILY. 90 tablet 3   Blood Glucose Monitoring Suppl (TRUE METRIX GO GLUCOSE METER) w/Device KIT USE TO CHECK BLOOD SUGAR ONCE DAILY. 1 kit 0   Dulaglutide (TRULICITY) 3 LE/7.5TZ SOPN Inject 3 mg as directed once a week. 2 mL 3   fenofibrate (TRICOR) 145 MG tablet TAKE 1 TABLET (145 MG TOTAL) BY MOUTH DAILY. 90 tablet 2   glipiZIDE (GLUCOTROL) 10 MG tablet Take 1 tab by mouth twice daily with meals 180 tablet 1   glucose blood test strip USE TO CHECK BLOOD SUGAR ONCE DAILY. 100 strip 2   hydrochlorothiazide (HYDRODIURIL) 25 MG tablet TAKE 1 TABLET  (25 MG TOTAL) BY MOUTH DAILY. 90 tablet 3   losartan (COZAAR) 25 MG tablet Take 1 tablet (25 mg total) by mouth daily. 30 tablet 0   metFORMIN (GLUCOPHAGE) 1000 MG tablet Take 1 tablet (1,000 mg total) by mouth 2 (two) times daily with a meal. 180 tablet 1   nystatin cream (MYCOSTATIN) Apply 1 application topically 2 (two) times daily. 30 g 0   rosuvastatin (CRESTOR) 20 MG tablet TAKE 1 TABLET (20 MG TOTAL) BY MOUTH DAILY. 30 tablet 2   senna (SENOKOT) 8.6 MG tablet Take 1 tablet (8.6 mg total) by mouth daily. 90 tablet 1   TRUEplus Lancets 28G MISC USE TO CHECK BLOOD SUGAR ONCE DAILY. 100 each 2   gabapentin (NEURONTIN) 100 MG capsule TAKE 1 CAPSULE (100 MG TOTAL) BY MOUTH 3 (THREE) TIMES DAILY. 90 capsule 3   [DISCONTINUED] ezetimibe (ZETIA) 10 MG tablet Take 1 tablet (10 mg total) by mouth daily. 90 tablet 3   [DISCONTINUED] pravastatin (PRAVACHOL) 80 MG tablet Take 1 tablet (80 mg total) by mouth daily. 90 tablet 1    No current facility-administered medications on file prior to visit.    ROS Comprehensive ROS noted in HPI positive and negative   Objective:  BP (!) 149/93 (BP Location: Right Arm, Patient Position: Sitting, Cuff Size: Normal)    Pulse 82    Temp 97.9 F (36.6 C) (Temporal)    Ht _0  (1.549 m)    Wt 170 lb 9.6 oz (  77.4 kg)    SpO2 99%    BMI 32.23 kg/m   BP Readings from Last 3 Encounters:  11/29/21 (!) 149/93  08/30/21 138/89  08/16/21 (!) 147/99    Wt Readings from Last 3 Encounters:  11/29/21 170 lb 9.6 oz (77.4 kg)  08/30/21 168 lb (76.2 kg)  08/16/21 169 lb 12.8 oz (77 kg)   Physical exam: General: Vital signs reviewed.  Patient is well-developed and well-nourished, obese male  in no acute distress and cooperative with exam. Head: Normocephalic and atraumatic. Eyes: EOMI, conjunctivae normal, no scleral icterus. Neck: Supple, trachea midline, normal ROM, no JVD, masses, thyromegaly, or carotid bruit present. Cardiovascular: RRR, S1 normal, S2 normal,  no murmurs, gallops, or rubs. Pulmonary/Chest: Clear to auscultation bilaterally, no wheezes, rales, or rhonchi. Abdominal: Soft, non-tender, non-distended, BS +, no masses, organomegaly, or guarding present. Musculoskeletal: No joint deformities, erythema, or stiffness, ROM full and nontender. Extremities: No lower extremity edema bilaterally,  pulses symmetric and intact bilaterally. No cyanosis or clubbing. Neurological: A&O x3, Strength is normal Skin: Warm, dry and intact. No rashes or erythema. Psychiatric: Normal mood and affect. speech and behavior is normal. Cognition and memory are normal.    Lab Results  Component Value Date   HGBA1C 10.3 (A) 11/29/2021   HGBA1C 10.2 (A) 08/16/2021   HGBA1C 9.0 (A) 05/17/2021    Lab Results  Component Value Date   WBC 8.5 02/09/2021   HGB 15.4 02/09/2021   HCT 45.0 02/09/2021   PLT 272 02/09/2021   GLUCOSE 87 02/09/2021   CHOL 223 (H) 02/09/2021   TRIG 188 (H) 02/09/2021   HDL 47 02/09/2021   LDLCALC 142 (H) 02/09/2021   ALT 33 02/09/2021   AST 31 02/09/2021   NA 140 02/09/2021   K 3.8 02/09/2021   CL 100 02/09/2021   CREATININE 0.74 (L) 02/09/2021   BUN 11 02/09/2021   CO2 24 02/09/2021   HGBA1C 10.3 (A) 11/29/2021     Assessment & Plan:   Griffin was seen today for diabetes.  Diagnoses and all orders for this visit:  Uncontrolled type 2 diabetes mellitus with hyperglycemia, without long-term current use of insulin (HCC) -     Glucose (CBG) non complaint not taking as prescribe -     HgB A1c 10.3 Discussed  co- morbidities with uncontrol diabetes  Complications -diabetic retinopathy, (close your eyes ? What do you see nothing) nephropathy decrease in kidney function- can lead to dialysis-on a machine 3 days a week to filter your kidney, neuropathy- numbness and tinging in your hands and feet,  increase risk of heart attack and stroke, and amputation due to decrease wound healing and circulation. Decrease your risk by taking  medication daily as prescribed, monitor carbohydrates- foods that are high in carbohydrates are the following rice, potatoes, breads, sugars, and pastas.  Reduction in the intake (eating) will assist in lowering your blood sugars. Exercise daily at least 30 minutes daily.   Need for immunization against influenza -     Flu Vaccine QUAD 84moIM (Fluarix, Fluzone & Alfiuria Quad PF)  Essential hypertension Counseled on blood pressure goal of less than 130/80, low-sodium, DASH diet, medication compliance, 150 minutes of moderate intensity exercise per week. Discussed medication compliance, adverse effects.   I am having WKermit Balomaintain his rosuvastatin, glucose blood, True Metrix Go Glucose Meter, gabapentin, fenofibrate, TRUEplus Lancets 28G, amLODipine, glipiZIDE, hydrochlorothiazide, metFORMIN, senna, nystatin cream, Trulicity, and losartan.  No orders of the defined types were placed  in this encounter.    Follow-up:   Return in about 3 months (around 02/27/2022) for PCP/fasting.  The above assessment and management plan was discussed with the patient. The patient verbalized understanding of and has agreed to the management plan. Patient is aware to call the clinic if symptoms fail to improve or worsen. Patient is aware when to return to the clinic for a follow-up visit. Patient educated on when it is appropriate to go to the emergency department.   Juluis Mire, NP-C

## 2021-11-29 NOTE — Patient Instructions (Addendum)
Gripe en los adultos Influenza, Adult A la gripe tambin se la conoce como influenza. Es una [employer], la nariz y la garganta (vas respiratorias). Se transmite fcilmente de persona a persona (es contagiosa). La gripe causa sntomas que son [employer] de un resfro, junto con fiebre alta y dolores corporales. Cules son las causas? La causa de esta afeccin es el virus de la influenza. Puede contraer el virus de las siguientes maneras: Al inhalar gotitas que quedan en el aire despus de que una persona infectada con gripe tosi o estornud. Al tocar algo que est contaminado con el virus y [employer] mano a la boca, la nariz o los ojos. Qu incrementa el riesgo? Hay ciertas cosas que lo pueden hacer ms propenso a Nurse, adult. Estas incluyen lo siguiente: No lavarse las manos con frecuencia. Tener contacto cercano con [employer] durante la temporada de resfro y gripe. Tocarse la boca, los ojos o la nariz sin antes lavarse las manos. No recibir la [employer]. Puede correr un mayor riesgo de [place] gripe, y [place] graves, como una infeccin pulmonar (neumona), si usted: Es mayor de 50 aos de edad. Est embarazada. Tiene debilitado el sistema que combate las defensas (sistema inmunitario) debido a una enfermedad o a que toma determinados medicamentos. Tiene una afeccin a largo plazo (crnica), como las siguientes: Enfermedad cardaca, renal o pulmonar. Diabetes. Asma. Tiene un trastorno heptico. Tiene mucho sobrepeso (obesidad [place]). Tiene anemia. Cules son los signos o sntomas? Los sntomas normalmente comienzan de repente y Sonda Primes [address] y [address]. Pueden incluir los siguientes: Cristy Hilts y [place]. Dolores de [place], dolores en el cuerpo o dolores musculares. Dolor de Investment banker, operational. Tos. Secrecin o congestin nasal. Molestias en el pecho. No querer comer tanto como lo hace normalmente. Sensacin de debilidad o  cansancio. Mareos. Malestar estomacal o vmitos. Cmo se trata? Si la gripe se detecta de forma temprana, puede recibir tratamiento con medicamentos antivirales. Esto puede ayudar a reducir la gravedad y la duracin de la enfermedad. Se los administrarn por boca o a travs de un tubo (catter) intravenoso. Cuidarse en su hogar puede ayudar a que mejoren los sntomas. El mdico puede recomendarle lo siguiente: Tomar medicamentos de Radio broadcast assistant. Beber abundante lquido. La gripe suele desaparecer sola. Si tiene sntomas muy graves u otros problemas, puede recibir tratamiento en un hospital. Siga estas instrucciones en su casa:   Actividad Descanse todo lo que sea necesario. Duerma lo suficiente. Foy [place] en su casa y no concurra al [place] o a la escuela, como se lo haya indicado el mdico. No salga de su casa hasta que no haya tenido fiebre por 24 horas sin tomar medicamentos. Salga de su casa solamente para ir al [employer]. Comida y bebida Wyatt Haste SRO (solucin de rehidratacin oral). Es Ardelia Mems bebida que se vende en farmacias y tiendas. Beba suficiente lquido como para Theatre manager la orina de color amarillo plido. En la medida en que pueda, beba lquidos transparentes en pequeas cantidades. Los lquidos transparentes son, por ejemplo: Grayce Sessions. Trocitos de hielo. Jugo de frutas mezclado con agua. Bebidas deportivas de bajas caloras. Coma alimentos suaves que sean fciles de digerir. En la medida que pueda, consuma pequeas cantidades. Estos alimentos incluyen: Bananas. Pur de [employer]. Arroz. Carnes magras. Tostadas. Galletas. No coma ni beba lo siguiente: Lquidos con alto contenido de azcar o cafena. Alcohol. Alimentos condimentados o con alto contenido de [place]. Indicaciones generales Use los medicamentos de venta libre y los recetados solamente  como se lo haya indicado el mdico. Use un humidificador de aire fro para que el aire de su casa est ms hmedo. Esto puede facilitar  la respiracin. Cuando utilice un humidificador de vapor fro, lmpielo a diario. Vace el agua y [place] por agua limpia. Al toser o estornudar, cbrase la boca y la [place]. Lvese las manos frecuentemente con agua y [place] y durante al menos 20 segundos. Esto tambin es importante despus de toser o Brewing technologist. Si no dispone de [place] y [place], use desinfectante para manos con alcohol. Cumpla con todas las visitas de seguimiento. Cmo se previene?  Colquese la vacuna antigripal todos los [place]. Puede colocarse la vacuna contra la gripe a fines de verano, en otoo o en invierno. Pregntele al mdico cundo debe aplicarse la vacuna contra la gripe. Evite el contacto con personas que estn enfermas durante el otoo y el invierno. Es la temporada del resfro y Counsellor. Comunquese con un mdico si: Tiene sntomas nuevos. Tiene los siguientes sntomas: Dolor de [place]. Materia fecal lquida (diarrea). Cristy Hilts. La tos empeora. Empieza a tener ms mucosidad. Tiene Higher education careers adviser. Vomita. Solicite ayuda de inmediato si: Le falta el aire. Tiene dificultad para respirar. La piel o las uas se ponen de un color azulado. Presenta dolor muy intenso o rigidez en el cuello. Tiene dolor de cabeza repentino. Le duele la cara o el odo de forma repentina. No puede comer ni beber sin vomitar. Estos sntomas pueden representar un problema grave que constituye Engineer, maintenance (IT). Solicite atencin mdica de inmediato. Comunquese con el servicio de emergencias de su localidad (911 en los [place]). No espere a ver si los sntomas desaparecen. No conduzca por sus propios medios [employer]. Resumen A la gripe tambin se la conoce como influenza. Es una [employer], la nariz y Patent examiner. Se transmite fcilmente de [place] persona a otra. Use los medicamentos de venta libre y los recetados solamente como se lo haya indicado el mdico. Aplicarse la vacuna contra la gripe todos los  aos es la mejor manera de no contagiarse la gripe. Esta informacin no tiene Marine scientist el consejo del mdico. Asegrese de hacerle al mdico cualquier pregunta que tenga. Document Revised: 09/29/2020 Document Reviewed: 09/29/2020 Elsevier Patient Education  [place]. Current Outpatient Medications on File Prior to Visit  Medication Sig Dispense Refill   amLODipine (NORVASC) 10 MG tablet TAKE 1 TABLET (10 MG TOTAL) BY MOUTH DAILY. 90 tablet 3   Blood Glucose Monitoring Suppl (TRUE METRIX GO GLUCOSE METER) w/Device KIT USE TO CHECK BLOOD SUGAR ONCE DAILY. 1 kit 0   Dulaglutide (TRULICITY) 3 KP/5.4SF SOPN Inject 3 mg as directed once a week. 2 mL 3   fenofibrate (TRICOR) 145 MG tablet TAKE 1 TABLET (145 MG TOTAL) BY MOUTH DAILY. 90 tablet 2   glipiZIDE (GLUCOTROL) 10 MG tablet Take 1 tab by mouth twice daily with meals 180 tablet 1   glucose blood test strip USE TO CHECK BLOOD SUGAR ONCE DAILY. 100 strip 2   hydrochlorothiazide (HYDRODIURIL) 25 MG tablet TAKE 1 TABLET (25 MG TOTAL) BY MOUTH DAILY. 90 tablet 3   losartan (COZAAR) 25 MG tablet Take 1 tablet (25 mg total) by mouth daily. 30 tablet 0   metFORMIN (GLUCOPHAGE) 1000 MG tablet Take 1 tablet (1,000 mg total) by mouth 2 (two) times daily with a meal. 180 tablet 1   nystatin cream (MYCOSTATIN) Apply 1 application topically 2 (two) times daily. 30 g 0  rosuvastatin (CRESTOR) 20 MG tablet TAKE 1 TABLET (20 MG TOTAL) BY MOUTH DAILY. 30 tablet 2   senna (SENOKOT) 8.6 MG tablet Take 1 tablet (8.6 mg total) by mouth daily. 90 tablet 1   TRUEplus Lancets 28G MISC USE TO CHECK BLOOD SUGAR ONCE DAILY. 100 each 2   gabapentin (NEURONTIN) 100 MG capsule TAKE 1 CAPSULE (100 MG TOTAL) BY MOUTH 3 (THREE) TIMES DAILY. 90 capsule 3   [DISCONTINUED] ezetimibe (ZETIA) 10 MG tablet Take 1 tablet (10 mg total) by mouth daily. 90 tablet 3   [DISCONTINUED] pravastatin (PRAVACHOL) 80 MG tablet Take 1 tablet (80 mg total) by mouth daily. 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 Qu no ve nada) nefropata disminucin de la funcin renal- puede conducir a dilisis-en una mquina 3 das a la semana para filtrar su rin, neuropata- entumecimiento y hormigueo en las manos y los pies, aumenta el riesgo de ataque cardaco y accidente cerebrovascular, y amputacin debido a la disminucin de la cicatrizacin de heridas y Curator. Disminuya su riesgo tomando medicamentos diariamente segn lo recetado, controle los carbohidratos: los alimentos con alto contenido de carbohidratos son el arroz, las papas, los panes, los azcares y las pastas. La reduccin en la ingesta (comer) ayudar a reducir los niveles de Dispensing optician. Haga ejercicio diariamente por lo menos 30 minutos diarios.

## 2021-12-06 ENCOUNTER — Other Ambulatory Visit: Payer: Self-pay

## 2021-12-13 ENCOUNTER — Other Ambulatory Visit: Payer: Self-pay

## 2021-12-15 ENCOUNTER — Other Ambulatory Visit (INDEPENDENT_AMBULATORY_CARE_PROVIDER_SITE_OTHER): Payer: Self-pay | Admitting: Primary Care

## 2021-12-15 ENCOUNTER — Other Ambulatory Visit: Payer: Self-pay

## 2021-12-15 MED ORDER — GABAPENTIN 100 MG PO CAPS
ORAL_CAPSULE | Freq: Three times a day (TID) | ORAL | 3 refills | Status: DC
  Filled 2021-12-15 – 2022-02-12 (×2): qty 90, 30d supply, fill #0

## 2021-12-15 MED FILL — Rosuvastatin Calcium Tab 20 MG: ORAL | 30 days supply | Qty: 30 | Fill #1 | Status: AC

## 2021-12-15 NOTE — Telephone Encounter (Signed)
Sent to PCP ?

## 2021-12-18 ENCOUNTER — Other Ambulatory Visit: Payer: Self-pay

## 2022-01-03 ENCOUNTER — Other Ambulatory Visit: Payer: Self-pay

## 2022-01-04 ENCOUNTER — Other Ambulatory Visit: Payer: Self-pay

## 2022-01-10 ENCOUNTER — Telehealth (INDEPENDENT_AMBULATORY_CARE_PROVIDER_SITE_OTHER): Payer: Self-pay | Admitting: Primary Care

## 2022-01-10 NOTE — Telephone Encounter (Signed)
Routed to PCP to refill if appropriate. Last documented Rx was from 2021.

## 2022-01-10 NOTE — Telephone Encounter (Signed)
Pt came to clinic to request refill for albuterol inhaler. Pt request to be sent to [facility]

## 2022-01-12 NOTE — Telephone Encounter (Signed)
Refilled denied.  

## 2022-01-23 ENCOUNTER — Encounter (INDEPENDENT_AMBULATORY_CARE_PROVIDER_SITE_OTHER): Payer: Self-pay | Admitting: Primary Care

## 2022-01-23 ENCOUNTER — Other Ambulatory Visit: Payer: Self-pay

## 2022-01-23 ENCOUNTER — Ambulatory Visit (INDEPENDENT_AMBULATORY_CARE_PROVIDER_SITE_OTHER): Payer: Self-pay | Admitting: Primary Care

## 2022-01-23 VITALS — BP 144/88 | HR 76 | Temp 97.8°F | Ht 61.0 in | Wt 172.4 lb

## 2022-01-23 DIAGNOSIS — R1032 Left lower quadrant pain: Secondary | ICD-10-CM

## 2022-01-23 DIAGNOSIS — R062 Wheezing: Secondary | ICD-10-CM

## 2022-01-23 DIAGNOSIS — I10 Essential (primary) hypertension: Secondary | ICD-10-CM

## 2022-01-23 DIAGNOSIS — R0602 Shortness of breath: Secondary | ICD-10-CM

## 2022-01-23 DIAGNOSIS — E1165 Type 2 diabetes mellitus with hyperglycemia: Secondary | ICD-10-CM

## 2022-01-23 MED ORDER — ALBUTEROL SULFATE HFA 108 (90 BASE) MCG/ACT IN AERS
2.0000 | INHALATION_SPRAY | Freq: Four times a day (QID) | RESPIRATORY_TRACT | 2 refills | Status: DC | PRN
  Filled 2022-01-23: qty 18, 25d supply, fill #0

## 2022-01-23 MED ORDER — LOSARTAN POTASSIUM 25 MG PO TABS
25.0000 mg | ORAL_TABLET | Freq: Every day | ORAL | 1 refills | Status: DC
  Filled 2022-01-23 – 2022-02-12 (×2): qty 30, 30d supply, fill #0

## 2022-01-23 MED ORDER — FLUTICASONE PROPIONATE HFA 110 MCG/ACT IN AERO
2.0000 | INHALATION_SPRAY | Freq: Two times a day (BID) | RESPIRATORY_TRACT | 12 refills | Status: DC
  Filled 2022-01-23: qty 12, 30d supply, fill #0

## 2022-01-23 NOTE — Progress Notes (Signed)
Corey Graham, is a 46 y.o. male  [id number]  [id number]  DOB - 1976/04/14  Chief Complaint  Patient presents with   Shortness of Breath    Wakes up feeling suffocated. SOB when walking. Wheezing in chest. Would like Rx for albuterol inhaler       Subjective:   Corey Graham is a 46 y.o. Hispanic male ( interpreter Debroah Baller [phone]) here today for a follow up visit for HTN- states last visit Bp medication was stopped. Explain it was not unclear were he receive the information from. Misunderstanding when medication is gone does not mean medication is completed . There are refills at the pharmacy call to pick up. Patient has No headache, No chest pain, No abdominal pain - No Nausea, No new weakness tingling or numbness. He has concerns with mesh place in his groin area and is causing pain on the left side only. He also, c/o shortness with exertion , hears wheezing in his chest and wakes up feeling suffocate.   No problems updated.  ALLERGIES: No Known Allergies  PAST MEDICAL HISTORY: Past Medical History:  Diagnosis Date   Diabetes mellitus without complication ([place]) 0000000   Essential hypertension 09/09/2017   Low back pain 06/24/2017    MEDICATIONS AT HOME: Prior to Admission medications   Medication Sig Start Date End Date Taking? Authorizing Provider  amLODipine (NORVASC) 10 MG tablet TAKE 1 TABLET (10 MG TOTAL) BY MOUTH DAILY. 08/16/21 08/16/22 Yes Aneri Slagel, Milford Cage, NP  Dulaglutide (TRULICITY) 3 0000000 SOPN Inject 3 mg as directed once a week. 10/25/21  Yes Kerin Perna, NP  fenofibrate (TRICOR) 145 MG tablet TAKE 1 TABLET (145 MG TOTAL) BY MOUTH DAILY. 05/17/21 05/17/22 Yes Kerin Perna, NP  gabapentin (NEURONTIN) 100 MG capsule TAKE 1 CAPSULE (100 MG TOTAL) BY MOUTH 3 (THREE) TIMES DAILY. 12/15/21 12/15/22 Yes Kerin Perna, NP  glipiZIDE (GLUCOTROL) 10 MG tablet Take 1 tab by mouth twice daily with  meals 08/16/21  Yes Kerin Perna, NP  hydrochlorothiazide (HYDRODIURIL) 25 MG tablet TAKE 1 TABLET (25 MG TOTAL) BY MOUTH DAILY. 08/16/21 08/16/22 Yes Kerin Perna, NP  metFORMIN (GLUCOPHAGE) 1000 MG tablet Take 1 tablet (1,000 mg total) by mouth 2 (two) times daily with a meal. 08/16/21  Yes Kerin Perna, NP  rosuvastatin (CRESTOR) 20 MG tablet TAKE 1 TABLET (20 MG TOTAL) BY MOUTH DAILY. 03/17/21 03/17/22 Yes Kerin Perna, NP  TRUEplus Lancets 28G MISC USE TO CHECK BLOOD SUGAR ONCE DAILY. 05/17/21 05/17/22 Yes Kerin Perna, NP  losartan (COZAAR) 25 MG tablet Take 1 tablet (25 mg total) by mouth daily. Patient not taking: Reported on 01/23/2022 11/07/21   Kerin Perna, NP  senna (SENOKOT) 8.6 MG tablet Take 1 tablet (8.6 mg total) by mouth daily. Patient not taking: Reported on 01/23/2022 08/16/21   Kerin Perna, NP  ezetimibe (ZETIA) 10 MG tablet Take 1 tablet (10 mg total) by mouth daily. 08/17/20 02/17/21  Kerin Perna, NP  pravastatin (PRAVACHOL) 80 MG tablet Take 1 tablet (80 mg total) by mouth daily. 08/17/20 03/17/21  Kerin Perna, NP    Objective:   Vitals:   01/23/22 0947  BP: (!) 144/88  Pulse: 76  Temp: 97.8 F (36.6 C)  TempSrc: Oral  SpO2: 97%  Weight: 172 lb 6.4 oz (78.2 kg)  Height: 5\' 1"  (1.549 m)   Exam General appearance : Awake, alert, not in any distress. Speech  Clear. Not toxic looking HEENT: Atraumatic and Normocephalic, pupils equally reactive to light and accomodation Neck: Supple, no JVD. No cervical lymphadenopathy.  Chest: wheezing auscultate in all 4 quadrant  CVS: S1 S2 regular, no murmurs.  Abdomen: Bowel sounds present, Non tender and not distended with no gaurding, rigidity or rebound. Extremities: B/L Lower Ext shows no edema, both legs are warm to touch Neurology: Awake alert, and oriented X 3, Non focal Skin: No Rash  Data Review Lab Results  Component Value Date   HGBA1C 10.3 (A) 11/29/2021   HGBA1C  10.2 (A) 08/16/2021   HGBA1C 9.0 (A) 05/17/2021    Assessment & Plan  Corey Graham was seen today for shortness of breath.  Diagnoses and all orders for this visit:  Groin pain, left -     Ambulatory referral to Urology  Essential hypertension NONCOMPLIANT with medication Counseled on blood pressure goal of less than 130/80, low-sodium, DASH diet, medication compliance, 150 minutes of moderate intensity exercise per week. Discussed medication compliance, adverse effects.  -     losartan (COZAAR) 25 MG tablet; Take 1 tablet (25 mg total) by mouth daily.  SOB (shortness of breath) albuterol (VENTOLIN HFA) 108 (90 Base) MCG/ACT inhaler; Inhale 2 puffs into the lungs every 6 (six) hours as needed for wheezing or shortness of breath. Referral to Dr. Joya Gaskins   Uncontrolled type 2 diabetes mellitus with hyperglycemia, without long-term current use of insulin ([place]) Noncompliant  -     Ambulatory referral to Optometry  Wheezing albuterol (VENTOLIN HFA) 108 (90 Base) MCG/ACT inhaler; Inhale 2 puffs into the lungs every 6 (six) hours as needed for wheezing or shortness of breath.      fluticasone (FLOVENT HFA) 110 MCG/ACT inhaler; Inhale 2 puffs into the lungs 2 (two) times daily. Referral to Dr. Joya Gaskins   Other orders -     albuterol (VENTOLIN HFA) 108 (90 Base) MCG/ACT inhaler; Inhale 2 puffs into the lungs every 6 (six) hours as needed for wheezing or shortness of breath. -     fluticasone (FLOVENT HFA) 110 MCG/ACT inhaler; Inhale 2 puffs into the lungs 2 (two) times daily.    Patient have been counseled extensively about nutrition and exercise. Other issues discussed during this visit include: low cholesterol diet, weight control and daily exercise, foot care, annual eye examinations at Ophthalmology, importance of adherence with medications and regular follow-up. We also discussed long term complications of uncontrolled diabetes and hypertension.   Return for refer to Dr. Joya Gaskins first  avaiable and keep diabetes appt with PCP.  The patient was given clear instructions to go to ER or return to medical center if symptoms don't improve, worsen or new problems develop. The patient verbalized understanding. The patient was told to call to get lab results if they haven't heard anything in the next week.   This note has been created with Surveyor, quantity. Any transcriptional errors are unintentional.   Kerin Perna, NP 01/23/2022, 10:34 AM

## 2022-01-23 NOTE — Patient Instructions (Signed)
Fluticasone Metered Dose Inhaler (MDI) Qu es este medicamento? La FLUTICASONA previene los sntomas del asma. Acta disminuyendo la inflamacin en las vas areas y facilitando la respiracin. Pertenece a un grupo de medicamentos llamados esteroides inhalados. Con frecuencia se denomina inhalador de control. No lo use para tratar un ataque repentino de asma. Este medicamento puede ser utilizado para otros usos; si tiene alguna pregunta consulte con su proveedor de atencin mdica o con su farmacutico. MARCAS COMUNES: Flovent, Flovent HFA Qu le debo informar a mi profesional de la salud antes de tomar este medicamento? Necesitan saber si usted presenta alguno de los siguientes problemas o situaciones: Enfermedad ocular Problemas del sistema inmunolgico Infeccin, especialmente infecciones virales, como varicela, fuegos labiales o herpes Lesin en la boca o la garganta Osteoporosis, huesos dbiles Recibe medicamentos esteroideos tales como dexametasona o prednisona Ciruga reciente Problemas de visin Una reaccin alrgica o inusual a la fluticasona, a los esteroides, a otros medicamentos, alimentos, colorantes o conservantes Si est embarazada o buscando quedar embarazada Si est amamantando a un beb Cmo debo [employer]? Este medicamento se inhala por la boca. Enjuguese la boca con agua luego de usarlo. Asegrese de no tragar [employer]. selo segn las instrucciones en la etiqueta del medicamento. No lo use con una frecuencia mayor a la indicada. Siga usndolo a menos que su equipo de atencin le indique dejar de Media planner. Este medicamento viene con INSTRUCCIONES DE USO. Pdale a su farmacutico que le indique cmo usar [employer]. Lea la informacin atentamente. Hable con su equipo de atencin si tiene alguna pregunta. Hable con su equipo de atencin sobre el uso de este medicamento en nios. Aunque este medicamento se puede recetar a nios tan pequeos como de 4 aos de edad  con ciertas afecciones, existen precauciones que deben tomarse. Sobredosis: Pngase en contacto inmediatamente con un centro toxicolgico o una sala de urgencia si usted cree que haya tomado demasiado medicamento. ATENCIN: [employer] es solo para usted. No comparta este medicamento con nadie. Qu sucede si me olvido de una dosis? Si olvida una dosis, adminstrela lo antes posible. Si es casi la hora de la prxima dosis, administre solo esa dosis. No se administre dosis adicionales o dobles. Qu puede interactuar con este medicamento? Ciertos antibiticos, tales como claritromicina, telitromicina Ciertos medicamentos antivirales para VIH o hepatitis Ciertos medicamentos para infecciones micticas, tales como ketoconazol, itraconazol, posaconazol, voriconazol Nefazodona Puede ser que esta lista no menciona todas las posibles interacciones. Informe a su profesional de [employer] de [employer] productos a base de hierbas, medicamentos de [place] o suplementos nutritivos que est tomando. Si usted fuma, consume bebidas alcohlicas o si utiliza drogas ilegales, indqueselo tambin a su profesional de [employer]. Algunas sustancias pueden interactuar con su medicamento. A qu debo estar atento al usar [employer]? Visite a su equipo de atencin para que revise su evolucin peridicamente. Infrmeles si los sntomas no comienzan a mejorar o si empeoran. Hable con su equipo de atencin sobre cmo tratar un ataque de asma agudo o un broncoespasmo (sibilancia). Asegrese de llevar siempre con usted un inhalador de accin rpida. Si [place] su inhalador de accin rpida y sus sntomas no mejoran, o si empeoran, llame a su equipo de atencin de inmediato. Usted y su equipo de atencin deben desarrollar un plan de accin para el asma exclusivo para usted. Asegrese de saber qu hacer si est en las zonas amarilla (el asma est empeorando) o roja (alerta mdica). Este medicamento puede aumentar su riesgo  de  contraer una infeccin. Consulte a su equipo de atencin si tiene fiebre, escalofros o dolor de garganta, o cualquier otro sntoma de resfro o gripe. No se trate usted mismo. Trate de no acercarse a personas que estn enfermas. Usar [employer] Corporation por un periodo prolongado puede debilitar sus huesos. Puede aumentar el riesgo de fracturas seas. Hable con su equipo de atencin [employer] sea. Este medicamento puede demorar el crecimiento de su hijo si se toman dosis altas durante un periodo prolongado. El equipo de atencin de su hijo vigilar su crecimiento. Este medicamento puede causar cataratas o glaucoma, especialmente con el uso a largo plazo. Debe realizarse controles oftalmolgicos peridicos mientras [place] este medicamento. Informe a su equipo de atencin si nota cambios en su visin. Qu efectos secundarios puedo tener al [employer] este medicamento? Efectos secundarios que debe informar a su equipo de atencin tan pronto como sea posible: Reacciones alrgicas: erupcin cutnea, comezn/picazn, urticaria, hinchazn de la cara, los labios, la lengua o la garganta Sntomas gripales: fiebre, escalofros, dolor muscular, tos, dolor de cabeza, fatiga Funcin disminuida de las glndulas: nuseas, vmito, prdida del apetito, debilidad o fatiga inusuales, Health and safety inspector, hormigueo o entumecimiento de las manos o los pies Dolor en los senos paranasales o presin alrededor del rostro o la frente Candidiasis bucal: manchas blancas en la boca Sibilancias o dificultad para respirar que empeora despus del uso Efectos secundarios que generalmente no requieren atencin mdica (debe informarlos a su equipo de atencin si persisten o si son molestos): Corporate treasurer sentido del gusto Tos Diarrea Dolor de Charity fundraiser Dolor de garganta Puede ser que esta lista no menciona todos los posibles efectos secundarios. Comunquese a su mdico por asesoramiento mdico [employer]lett[employer]ard. Usted puede informar  los efectos secundarios a la FDA por telfono al 1-800-FDA-1088. Dnde debo guardar mi medicina? Mantenga fuera del alcance de nios y Neurosurgeon. Guarde a [employer], entre 20 y 25 grados Celsius (68 y 59 grados Fahrenheit) con la boquilla [place]. Mantenga el inhalador alejado del calor extremo. Deseche este medicamento cuando el contador de dosis indique "0" o despus de la fecha de vencimiento, lo que ocurra primero. Para desechar los medicamentos que ya no necesite o que estn vencidos: Lleve el medicamento a un programa de recuperacin de medicamentos. Consulte con su farmacia o con una entidad reguladora para encontrar un lugar donde llevarlo. Si no puede Government social research officer, pregntele a su equipo de atencin cmo desecharlo de manera segura. ATENCIN: Este folleto es un resumen. Puede ser que no cubra toda la posible informacin. Si usted tiene preguntas acerca de esta medicina, consulte con su mdico, su farmacutico o su profesional de Radiographer, therapeutic.  2022 Elsevier/Gold Standard (2021-03-13 00:00:00)

## 2022-01-29 ENCOUNTER — Other Ambulatory Visit: Payer: Self-pay

## 2022-01-30 ENCOUNTER — Other Ambulatory Visit: Payer: Self-pay

## 2022-02-10 NOTE — Progress Notes (Signed)
Established Patient Office Visit  Subjective:  Patient ID: Corey Graham, male    DOB: 06/04/1976  Age: 46 y.o. MRN: [id number]  CC: Dyspnea, cough    HPI Corey Graham presents for dyspnea evaluation.  Ref by PCP Oletta Lamas. This is a 46 year old male who has onset of dyspnea and wheezing every winter for the past several years.  He states he has morning nasal congestion postnasal drip as well that gets better as the day goes on.  This visit was assisted by video interpreter is real [phone]  On arrival blood pressure elevated at 149/94.  Patient does have a dry cough.  He does have chest tightness.  He is short of breath with exertion.  He has been using his son's albuterol inhaler.  He was issued an albuterol inhaler and Flovent by his primary care provider but he never had this filled.  Patient denies any mold exposure in the home.  He does have high level reflux at this time.  He has never had recent lung function test.  He had a chest x-ray in 2016 which showed bronchiolitis.    Past Medical History:  Diagnosis Date   Diabetes mellitus without complication ([place]) 0000000   Essential hypertension 09/09/2017   Low back pain 06/24/2017    Past Surgical History:  Procedure Laterality Date   HERNIA REPAIR      History reviewed. No pertinent family history.  Social History   Socioeconomic History   Marital status: Married    Spouse name: Not on file   Number of children: 3   Years of education: Not on file   Highest education level: Not on file  Occupational History   Not on file  Tobacco Use   Smoking status: Never   Smokeless tobacco: Never  Substance and Sexual Activity   Alcohol use: Yes    Comment: infrequent   Drug use: No   Sexual activity: Yes  Other Topics Concern   Not on file  Social History Narrative   [place] at home with wife and 3 children   Social Determinants of Health   Financial Resource Strain: Not on file  Food Insecurity: Not on  file  Transportation Needs: Not on file  Physical Activity: Not on file  Stress: Not on file  Social Connections: Not on file  Intimate Partner Violence: Not on file    Outpatient Medications Prior to Visit  Medication Sig Dispense Refill   amLODipine (NORVASC) 10 MG tablet TAKE 1 TABLET (10 MG TOTAL) BY MOUTH DAILY. 90 tablet 3   Dulaglutide (TRULICITY) 3 0000000 SOPN Inject 3 mg as directed once a week. 2 mL 3   fenofibrate (TRICOR) 145 MG tablet TAKE 1 TABLET (145 MG TOTAL) BY MOUTH DAILY. 90 tablet 2   gabapentin (NEURONTIN) 100 MG capsule TAKE 1 CAPSULE (100 MG TOTAL) BY MOUTH 3 (THREE) TIMES DAILY. 90 capsule 3   glipiZIDE (GLUCOTROL) 10 MG tablet Take 1 tab by mouth twice daily with meals 180 tablet 1   hydrochlorothiazide (HYDRODIURIL) 25 MG tablet TAKE 1 TABLET (25 MG TOTAL) BY MOUTH DAILY. 90 tablet 3   losartan (COZAAR) 25 MG tablet Take 1 tablet (25 mg total) by mouth daily. 90 tablet 1   metFORMIN (GLUCOPHAGE) 1000 MG tablet Take 1 tablet (1,000 mg total) by mouth 2 (two) times daily with a meal. 180 tablet 1   rosuvastatin (CRESTOR) 20 MG tablet TAKE 1 TABLET (20 MG TOTAL) BY MOUTH DAILY. 30 tablet 2  senna (SENOKOT) 8.6 MG tablet Take 1 tablet (8.6 mg total) by mouth daily. 90 tablet 1   TRUEplus Lancets 28G MISC USE TO CHECK BLOOD SUGAR ONCE DAILY. 100 each 2   albuterol (VENTOLIN HFA) 108 (90 Base) MCG/ACT inhaler Inhale 2 puffs into the lungs every 6 (six) hours as needed for wheezing or shortness of breath. 18 g 2   fluticasone (FLOVENT HFA) 110 MCG/ACT inhaler Inhale 2 puffs into the lungs 2 (two) times daily. 12 g 12   No facility-administered medications prior to visit.    No Known Allergies  ROS Review of Systems  Constitutional:  Negative for chills, diaphoresis, fatigue and fever.  HENT:  Positive for dental problem, postnasal drip and rhinorrhea. Negative for congestion, drooling, ear discharge, ear pain, facial swelling, hearing loss, mouth sores,  nosebleeds, sinus pressure, sinus pain, sore throat, tinnitus and trouble swallowing.        Dental molar with  cavities and dental pain  Eyes:  Negative for photophobia and redness.  Respiratory:  Positive for cough, shortness of breath and wheezing. Negative for choking, chest tightness and stridor.   Cardiovascular:  Negative for chest pain, palpitations and leg swelling.  Gastrointestinal:  Negative for abdominal distention, abdominal pain, anal bleeding, blood in stool, constipation, diarrhea, nausea, rectal pain and vomiting.       GERD symptoms present:  worse for two weeks  Endocrine: Negative for polydipsia.  Genitourinary: Negative.  Negative for dysuria, flank pain, frequency, hematuria and urgency.  Musculoskeletal: Negative.  Negative for back pain, myalgias and neck pain.  Skin:  Negative for rash.  Allergic/Immunologic: Negative for environmental allergies.  Neurological: Negative.  Negative for dizziness, tremors, seizures, weakness and headaches.  Hematological: Negative.  Does not bruise/bleed easily.  Psychiatric/Behavioral: Negative.  Negative for suicidal ideas. The patient is not nervous/anxious.      Objective:    Physical Exam Vitals reviewed.  Constitutional:      Appearance: Normal appearance. He is well-developed. He is not diaphoretic.  HENT:     Head: Normocephalic and atraumatic.     Right Ear: Tympanic membrane and ear canal normal.     Left Ear: Tympanic membrane and ear canal normal.     Nose: Congestion and rhinorrhea present. No nasal deformity, septal deviation or mucosal edema.     Right Sinus: No maxillary sinus tenderness or frontal sinus tenderness.     Left Sinus: No maxillary sinus tenderness or frontal sinus tenderness.     Mouth/Throat:     Mouth: Mucous membranes are moist.     Pharynx: Oropharynx is clear. No oropharyngeal exudate.  Eyes:     General: No scleral icterus.    Conjunctiva/sclera: Conjunctivae normal.     Pupils: Pupils  are equal, round, and reactive to light.  Neck:     Thyroid: No thyromegaly.     Vascular: No carotid bruit or JVD.     Trachea: Trachea normal. No tracheal tenderness or tracheal deviation.  Cardiovascular:     Rate and Rhythm: Normal rate and regular rhythm.     Chest Wall: PMI is not displaced.     Pulses: Normal pulses. No decreased pulses.     Heart sounds: Normal heart sounds, S1 normal and S2 normal. Heart sounds not distant. No murmur heard. No systolic murmur is present.  No diastolic murmur is present.    No friction rub. No gallop. No S3 or S4 sounds.  Pulmonary:     Effort: Pulmonary effort is normal.  No tachypnea, accessory muscle usage or respiratory distress.     Breath sounds: No stridor. Wheezing present. No decreased breath sounds, rhonchi or rales.  Chest:     Chest wall: No tenderness.  Abdominal:     General: Bowel sounds are normal. There is no distension.     Palpations: Abdomen is soft. Abdomen is not rigid.     Tenderness: There is no abdominal tenderness. There is no guarding or rebound.  Musculoskeletal:        General: Normal range of motion.     Cervical back: Normal range of motion and neck supple. No edema, erythema or rigidity. No muscular tenderness. Normal range of motion.  Lymphadenopathy:     Head:     Right side of head: No submental or submandibular adenopathy.     Left side of head: No submental or submandibular adenopathy.     Cervical: No cervical adenopathy.  Skin:    General: Skin is warm and dry.     Coloration: Skin is not pale.     Findings: No rash.     Nails: There is no clubbing.  Neurological:     Mental Status: He is alert and oriented to person, place, and time.     Sensory: No sensory deficit.  Psychiatric:        Speech: Speech normal.        Behavior: Behavior normal.    BP (!) 149/94    Pulse 87    Wt 172 lb 12.8 oz (78.4 kg)    SpO2 96%    BMI 32.65 kg/m  Wt Readings from Last 3 Encounters:  02/12/22 172 lb 12.8 oz  (78.4 kg)  01/23/22 172 lb 6.4 oz (78.2 kg)  11/29/21 170 lb 9.6 oz (77.4 kg)     Health Maintenance Due  Topic Date Due   OPHTHALMOLOGY EXAM  Never done   HIV Screening  Never done    There are no preventive care reminders to display for this patient.  No results found for: TSH Lab Results  Component Value Date   WBC 8.5 02/09/2021   HGB 15.4 02/09/2021   HCT 45.0 02/09/2021   MCV 87 02/09/2021   PLT 272 02/09/2021   Lab Results  Component Value Date   NA 140 02/09/2021   K 3.8 02/09/2021   CO2 24 02/09/2021   GLUCOSE 87 02/09/2021   BUN 11 02/09/2021   CREATININE 0.74 (L) 02/09/2021   BILITOT 0.3 02/09/2021   ALKPHOS 102 02/09/2021   AST 31 02/09/2021   ALT 33 02/09/2021   PROT 7.7 02/09/2021   ALBUMIN 4.8 02/09/2021   CALCIUM 9.6 02/09/2021   Lab Results  Component Value Date   CHOL 223 (H) 02/09/2021   Lab Results  Component Value Date   HDL 47 02/09/2021   Lab Results  Component Value Date   LDLCALC 142 (H) 02/09/2021   Lab Results  Component Value Date   TRIG 188 (H) 02/09/2021   Lab Results  Component Value Date   CHOLHDL 4.7 02/09/2021   Lab Results  Component Value Date   HGBA1C 10.3 (A) 11/29/2021      Assessment & Plan:   Problem List Items Addressed This Visit       Respiratory   Moderate persistent asthma with acute exacerbation    Symptom complex most compatible with moderate persistent asthma  Begin Flovent 2 inhalations twice daily  Use albuterol as needed  Begin pulsed prednisone 20 mg a day  for 5 days and stop  Begin pantoprazole 40 mg daily to reduce acid output  Reflux diet given  Obtain chest x-ray  Return for follow-up 4 weeks          Relevant Medications   albuterol (VENTOLIN HFA) 108 (90 Base) MCG/ACT inhaler   fluticasone (FLOVENT HFA) 110 MCG/ACT inhaler   predniSONE (DELTASONE) 10 MG tablet     Endocrine   Uncontrolled type 2 diabetes mellitus with hyperglycemia, without long-term current  use of insulin (HCC) - Primary   Relevant Orders   POCT glucose (manual entry) (Completed)   DG Chest 2 View    Meds ordered this encounter  Medications   albuterol (VENTOLIN HFA) 108 (90 Base) MCG/ACT inhaler    Sig: Inhale 2 puffs into the lungs every 6 (six) hours as needed for wheezing or shortness of breath.    Dispense:  18 g    Refill:  2   fluticasone (FLOVENT HFA) 110 MCG/ACT inhaler    Sig: Inhale 2 puffs into the lungs 2 (two) times daily.    Dispense:  12 g    Refill:  12    pass   predniSONE (DELTASONE) 10 MG tablet    Sig: Take 2 tablets daily for 5 days then stop    Dispense:  10 tablet    Refill:  0   pantoprazole (PROTONIX) 40 MG tablet    Sig: Take 1 tablet (40 mg total) by mouth daily.    Dispense:  30 tablet    Refill:  3    Follow-up: Return in about 1 month (around 03/12/2022).    Asencion Noble, MD

## 2022-02-12 ENCOUNTER — Ambulatory Visit
Admission: RE | Admit: 2022-02-12 | Discharge: 2022-02-12 | Disposition: A | Discharge status: Home / Self Care | Payer: No Typology Code available for payment source | Source: Ambulatory Visit | Attending: Critical Care Medicine | Admitting: Critical Care Medicine

## 2022-02-12 ENCOUNTER — Other Ambulatory Visit: Payer: Self-pay

## 2022-02-12 ENCOUNTER — Encounter: Payer: Self-pay | Admitting: Critical Care Medicine

## 2022-02-12 ENCOUNTER — Ambulatory Visit: Payer: Self-pay | Attending: Critical Care Medicine | Admitting: Critical Care Medicine

## 2022-02-12 VITALS — BP 149/94 | HR 87 | Wt 172.8 lb

## 2022-02-12 DIAGNOSIS — E1165 Type 2 diabetes mellitus with hyperglycemia: Secondary | ICD-10-CM

## 2022-02-12 DIAGNOSIS — J4541 Moderate persistent asthma with (acute) exacerbation: Secondary | ICD-10-CM

## 2022-02-12 DIAGNOSIS — J4540 Moderate persistent asthma, uncomplicated: Secondary | ICD-10-CM | POA: Insufficient documentation

## 2022-02-12 LAB — GLUCOSE, POCT (MANUAL RESULT ENTRY): POC Glucose: 114 mg/dl — AB (ref 70–99)

## 2022-02-12 MED ORDER — FLUTICASONE PROPIONATE HFA 110 MCG/ACT IN AERO
2.0000 | INHALATION_SPRAY | Freq: Two times a day (BID) | RESPIRATORY_TRACT | 12 refills | Status: DC
  Filled 2022-02-12: qty 12, 30d supply, fill #0

## 2022-02-12 MED ORDER — PREDNISONE 10 MG PO TABS
ORAL_TABLET | ORAL | 0 refills | Status: DC
  Filled 2022-02-12: qty 10, 5d supply, fill #0

## 2022-02-12 MED ORDER — PANTOPRAZOLE SODIUM 40 MG PO TBEC
40.0000 mg | DELAYED_RELEASE_TABLET | Freq: Every day | ORAL | 3 refills | Status: DC
  Filled 2022-02-12: qty 30, 30d supply, fill #0
  Filled 2022-11-05: qty 30, 30d supply, fill #1

## 2022-02-12 MED ORDER — ALBUTEROL SULFATE HFA 108 (90 BASE) MCG/ACT IN AERS
2.0000 | INHALATION_SPRAY | Freq: Four times a day (QID) | RESPIRATORY_TRACT | 2 refills | Status: DC | PRN
  Filled 2022-02-12: qty 18, 25d supply, fill #0

## 2022-02-12 MED FILL — Rosuvastatin Calcium Tab 20 MG: ORAL | 30 days supply | Qty: 30 | Fill #0 | Status: AC

## 2022-02-12 NOTE — Patient Instructions (Signed)
Begin Flovent 2 inhalations twice daily  Use albuterol 2 inhalations 4 times daily as needed for wheezing and shortness of breath  Begin pantoprazole daily 30 minutes before eating breakfast to reduce acid and follow low acid diet as attached  Begin prednisone to 10 mg tablets daily for 5 days and stop  Return to see Dr. Delford Field in 1 month  Stop by x-ray department on the first floor and get a chest x-ray before you leave today  All medications sent to our pharmacy downstairs  Comience las inhalaciones de Flovent 2 dos veces al da  Use albuterol 2 inhalaciones 4 veces al da segn sea necesario para sibilancias y dificultad para respirar  Comience con pantoprazol diariamente 30 minutos antes de desayunar para reducir el cido y siga la dieta baja en cido como se adjunta  Comience con prednisona en tabletas de 10 mg al da durante 5 das y suspenda  Volver a ver al Dr. Delford Field en 1 mes  Pase por el departamento de rayos X en el primer piso y hgase una radiografa de trax antes de irse hoy  [employer] medicamentos enviados a nuestra farmacia [place].

## 2022-02-12 NOTE — Assessment & Plan Note (Signed)
Symptom complex most compatible with moderate persistent asthma  Begin Flovent 2 inhalations twice daily  Use albuterol as needed  Begin pulsed prednisone 20 mg a day for 5 days and stop  Begin pantoprazole 40 mg daily to reduce acid output  Reflux diet given  Obtain chest x-ray  Return for follow-up 4 weeks

## 2022-02-15 ENCOUNTER — Telehealth: Payer: Self-pay

## 2022-02-15 NOTE — Telephone Encounter (Signed)
Pt was called and is aware of results, DOB was confirmed.  ? ?interpreter [phone] ?

## 2022-02-15 NOTE — Telephone Encounter (Signed)
-----   Message from Storm Frisk, MD sent at 02/14/2022  5:48 AM EST ----- ?Let pt know Chest xray is normal ?

## 2022-02-16 ENCOUNTER — Other Ambulatory Visit: Payer: Self-pay

## 2022-02-27 ENCOUNTER — Ambulatory Visit (INDEPENDENT_AMBULATORY_CARE_PROVIDER_SITE_OTHER): Payer: Self-pay | Admitting: Primary Care

## 2022-03-02 ENCOUNTER — Other Ambulatory Visit: Payer: Self-pay

## 2022-03-05 ENCOUNTER — Encounter (INDEPENDENT_AMBULATORY_CARE_PROVIDER_SITE_OTHER): Payer: Self-pay | Admitting: Primary Care

## 2022-03-08 ENCOUNTER — Other Ambulatory Visit: Payer: Self-pay

## 2022-03-15 ENCOUNTER — Other Ambulatory Visit: Payer: Self-pay

## 2022-03-15 MED ORDER — MELOXICAM 15 MG PO TABS
15.0000 mg | ORAL_TABLET | Freq: Every day | ORAL | 1 refills | Status: DC
  Filled 2022-03-15: qty 30, 30d supply, fill #0

## 2022-03-21 ENCOUNTER — Other Ambulatory Visit: Payer: Self-pay

## 2022-03-28 ENCOUNTER — Ambulatory Visit (INDEPENDENT_AMBULATORY_CARE_PROVIDER_SITE_OTHER): Payer: Self-pay | Admitting: Primary Care

## 2022-03-28 ENCOUNTER — Encounter (INDEPENDENT_AMBULATORY_CARE_PROVIDER_SITE_OTHER): Payer: Self-pay | Admitting: Primary Care

## 2022-03-28 ENCOUNTER — Other Ambulatory Visit: Payer: Self-pay

## 2022-03-28 VITALS — BP 136/88 | HR 90 | Temp 97.7°F | Ht 61.0 in | Wt 170.0 lb

## 2022-03-28 DIAGNOSIS — E1165 Type 2 diabetes mellitus with hyperglycemia: Secondary | ICD-10-CM

## 2022-03-28 DIAGNOSIS — Z760 Encounter for issue of repeat prescription: Secondary | ICD-10-CM

## 2022-03-28 DIAGNOSIS — I10 Essential (primary) hypertension: Secondary | ICD-10-CM

## 2022-03-28 DIAGNOSIS — Z0100 Encounter for examination of eyes and vision without abnormal findings: Secondary | ICD-10-CM

## 2022-03-28 DIAGNOSIS — E119 Type 2 diabetes mellitus without complications: Secondary | ICD-10-CM

## 2022-03-28 LAB — POCT GLYCOSYLATED HEMOGLOBIN (HGB A1C): Hemoglobin A1C: 9.3 % — AB (ref 4.0–5.6)

## 2022-03-28 MED ORDER — LOSARTAN POTASSIUM 25 MG PO TABS
25.0000 mg | ORAL_TABLET | Freq: Every day | ORAL | 1 refills | Status: DC
  Filled 2022-03-28: qty 90, 90d supply, fill #0
  Filled 2022-04-20: qty 30, 30d supply, fill #0
  Filled 2022-11-05: qty 30, 30d supply, fill #1

## 2022-03-28 MED ORDER — ROSUVASTATIN CALCIUM 20 MG PO TABS
ORAL_TABLET | Freq: Every day | ORAL | 2 refills | Status: DC
  Filled 2022-03-28 – 2022-04-20 (×2): qty 30, 30d supply, fill #0
  Filled 2022-11-05: qty 30, 30d supply, fill #1

## 2022-03-28 MED ORDER — GLIPIZIDE 10 MG PO TABS
ORAL_TABLET | ORAL | 1 refills | Status: DC
  Filled 2022-03-28: qty 180, 90d supply, fill #0
  Filled 2022-11-05: qty 60, 30d supply, fill #0

## 2022-03-28 MED ORDER — AMLODIPINE BESYLATE 10 MG PO TABS
ORAL_TABLET | Freq: Every day | ORAL | 3 refills | Status: DC
  Filled 2022-03-28: qty 90, 90d supply, fill #0
  Filled 2022-04-20: qty 30, 30d supply, fill #0
  Filled 2022-11-05: qty 30, 30d supply, fill #1

## 2022-03-28 MED ORDER — METFORMIN HCL 1000 MG PO TABS
1000.0000 mg | ORAL_TABLET | Freq: Two times a day (BID) | ORAL | 1 refills | Status: DC
Start: 2022-03-28 — End: 2022-12-05
  Filled 2022-03-28: qty 180, 90d supply, fill #0
  Filled 2022-11-05: qty 60, 30d supply, fill #0

## 2022-03-28 NOTE — Patient Instructions (Signed)
Dulaglutide Injection ??Qu? es [employer]? ?La DULAGLUTIDA controla los niveles de az?car en la sangre en personas con diabetes tipo 2. Se [place] con cambios en el estilo de vida tales como dieta y ejercicio f?sico. Podr?a disminuir el riesgo de problemas que necesiten tratamiento en el hospital. Estos problemas incluyen ataque cardiaco o accidente cerebrovascular. ?Este medicamento puede ser utilizado para otros usos; si tiene alguna pregunta consulte con su proveedor de atenci?n m?dica o con su farmac?utico. ?MARCAS COMUNES: Trulicity ??Qu? le debo informar a mi profesional de la salud antes de tomar este medicamento? ?Necesitan saber si usted presenta alguno de los [employer] [facility] [employer] o situaciones: ?tumores endocrinos (neoplasia end?crina m?ltiple tipo II) o si alguien en su familiar tuvo esos tumores ?enfermedad ocular, problemas de la visi?n ?antecedentes de pancreatitis ?enfermedad renal ?enfermedad hep?tica ?problemas estomacales o intestinales ?c?ncer de tiroides o si alguien en su familia tuvo c?ncer de tiroides ?una reacci?n al?rgica o inusual a la dulaglutida, a otros medicamentos, alimentos, colorantes o conservantes ?si est? embarazada o buscando quedar embarazada ?si est? amamantando a un beb? ??C?mo debo utilizar este medicamento? ?Este medicamento se inyecta debajo de la piel. Le ense?ar?n c?mo prepararlo y administrarlo. ?selo seg?n las instrucciones en la etiqueta el mismo d?a todas las semanas. NO presione repetidamente el inyector. Siga us?ndolo a menos que su proveedor de atenci?n m?dica le indique dejar de [employer]. ?Si [place] este medicamento con insulina, debe inyectar este medicamento y la insulina por separado. No los mezcle. No se aplique una inyecci?n al lado de la otra. Cambie (rote) los sitios de inyecci?n con cada inyecci?n. ?Este f?rmaco viene con INSTRUCCIONES DE USO. P?dale a su farmac?utico que le indique c?mo usar [employer]. Lea la informaci?n atentamente. Hable con su  farmac?utico o su proveedor de atenci?n m?dica si tiene alguna pregunta. ?Es importante que deseche las agujas y las jeringas usadas en un recipiente resistente a los pinchazos. No las deseche en la basura. Si no tiene un recipiente resistente a los pinchazos, llame a su farmac?utico o proveedor de atenci?n de la salud para obtenerlo. ?Su farmac?utico le dar? una Gu?a del medicamento especial (MedGuide, nombre en ingl?s) con cada receta y en cada ocasi?n que la vuelva a surtir. Aseg?rese de leer esta informaci?n cada vez cuidadosamente. ?Hable con su proveedor de atenci?n m?dica sobre el uso de este medicamento en ni?os. Puede requerir atenci?n especial. ?Sobredosis: P?ngase en contacto inmediatamente con un centro toxicol?gico o una sala de urgencia si usted cree que haya tomado demasiado medicamento. ?ATENCI?N: [employer] es solo para usted. No comparta este medicamento con nadie. ??Qu? sucede si me olvido de una dosis? ?Si olvida una dosis, admin?strela lo antes posible, a menos que hayan pasado m?s de 3 d?as. Si han pasado m?s de 3 d?as desde el momento de administraci?n habitual, omita la dosis que se olvid?. Administre la pr?xima dosis a la hora habitual. ??Qu? puede interactuar con este medicamento? ?otros medicamentos para la diabetes ?Muchos medicamentos pueden causar cambios en el nivel de az?car en la sangre. Estos incluyen: ?bebidas con alcohol ?medicamentos antivirales para el VIH o SIDA ?aspirina y medicamentos tipo aspirina ?ciertos medicamentos para la presi?n arterial, enfermedad cardiaca y frecuencia cardiaca irregular ?cromo ?diur?ticos ?hormonas femeninas, tales como estr?genos o progestinas, p?ldoras anticonceptivas ?fenofibrato ?gemfibrozil ?isoniazida ?lanreotida ?hormonas masculinas o esteroides anab?licos ?IMAO, tales como Carbex, Eldepryl, Marplan, Nardil y Parnate ?medicamentos para alergias, asma, resfr?os o tos ?medicamentos para la depresi?n, ansiedad o trastornos  psic?ticos ?medicamentos para bajar de peso ?niacina ?nicotina ?Murriel Hopper,  medicamentos para el dolor y la inflamaci?n, tales como ibuprofeno o naproxeno ?octreotida ?pasireotida ?pentamidina ?fenito?na ?probenecid ?antibi?ticos del grupo de las quinolonas, tales como ciprofloxacino, levofloxacino y ofloxacino ?algunos suplementos diet?ticos a base de hierbas ?medicamentos esteroideos, tales como la prednisona o la cortisona ?sulfametoxasol; trimetoprima ?hormonas tiroideas ?Algunos medicamentos pueden ocultar los s?ntomas de advertencia de niveles bajos de az?car en la sangre (hipoglucemia). Es posible que deba monitorear m?s atentamente su nivel de az?car en la sangre si est? tomando uno de estos medicamentos. Estos medicamentos incluyen: ?betabloqueadores, que con frecuencia se usan para la presi?n arterial alta o problemas cardiacos (algunos ejemplos son atenolol, metoprolol y propranolol) ?clonidina ?guanetidina ?reserpina ?Puede ser que esta lista no menciona todas las posibles interacciones. Informe a su profesional de la salud de todos los productos a base de hierbas, medicamentos de venta libre o suplementos nutritivos que est? tomando. Si usted fuma, consume bebidas alcoh?licas o si utiliza drogas ilegales, ind?queselo tambi?n a su profesional de la salud. Algunas sustancias pueden interactuar con su medicamento. ??A qu? debo estar atento al usar este medicamento? ?Visite a su proveedor de atenci?n m?dica para que revise regularmente su evoluci?n. ?Consulte con su proveedor de atenci?n m?dica si tiene diarrea grave, n?useas y v?mitos, o sudoraci?n intensa. La p?rdida de demasiado l?quido corporal puede hacer que sea peligroso usar este medicamento. ?Se monitorizar? una prueba llamada Hemoglobina A1C (A1C). Es un an?lisis de sangre sencillo. Mide su control del nivel de az?car en la sangre durante los ?ltimos 2 a 3 meses. Se le realizar? esta prueba cada 3 a 6 meses. ?Aprenda a revisar su nivel de az?car en la  sangre. Conozca los s?ntomas del nivel bajo y alto de az?car en la sangre y c?mo controlarlos. ?Siempre lleve con usted una fuente r?pida de az?car por si tiene s?ntomas de nivel bajo de az?car en la sangre. Algunos ejemplos incluyen caramelos duros de az?car o tabletas de glucosa. Aseg?rese de que otras personas sepan que usted se puede ahogar si come o bebe cuando presenta s?ntomas graves de nivel bajo de az?car en la sangre, como convulsiones o p?rdida del conocimiento. Debe obtener ayuda m?dica de inmediato. ?Informe a su proveedor de atenci?n m?dica si tiene niveles altos de az?car en la sangre. Es posible que deba cambiar la dosis de su medicamento. Si est? enfermo o hace m?s ejercicio que lo habitual, es posible que necesite cambiar la dosis de su medicamento. ?No saltee comidas. Pregunte a su proveedor de atenci?n m?dica si debe evitar el alcohol. Muchos productos de venta libre para la tos y el resfr?o contienen az?car o alcohol. Estos pueden afectar los niveles de az?car en la sangre. ?Los inyectores nunca deben compartirse. Incluso si se cambia la aguja, al compartir se pueden contagiar virus como la hepatitis o el VIH. ?Use un brazalete o una cadena de identificaci?n m?dica. Lleve consigo una tarjeta que describa su afecci?n. Incluya en la tarjeta una lista de los medicamentos y las dosis que usa. ??Qu? efectos secundarios puedo tener al utilizar este medicamento? ?Efectos secundarios que debe informar a su m?dico o a su profesional de la salud tan pronto como sea posible: ?reacciones al?rgicas (erupci?n cut?nea, comez?n/picaz?n o urticaria; hinchaz?n de la cara, los labios o la lengua) ?cambios en la visi?n ?diarrea que contin?a o es grave ?infecci?n (fiebre, escalofr?os, tos, dolor de garganta, dolor o dificultad para orinar) ?lesi?n renal (dificultad para orinar o cambios en la cantidad de orina) ?niveles bajos de az?car en la sangre (ansiedad; confusi?n; mareos; aumento del apetito;   debilidad o  cansancio inusuales; aumento de la sudoraci?n; temblores; piel fr?a y sudorosa; irritabilidad; dolor de cabeza; visi?n borrosa; frecuencia cardiaca r?pida; p?rdida del conocimiento) ?bulto o hinchaz?n en el cuello ?problemas

## 2022-03-28 NOTE — Progress Notes (Signed)
? ?Subjective:  ?Patient ID: Corey KoyanagiWiston Gonzalez Graham, male    DOB: 07/04/1976  Age: 46 y.o. MRN: [id number] ? ?CC: Diabetes ? ? ?HPI ?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 Patient does not check blood sugar at home (not regularly)  ? ?Compliant with meds - No (sometimes forgets his second dose of insulin)  ?Checking CBGs? No ? Fasting avg -  ? Postprandial average -  ?Exercising regularly? - Yes ?Watching carbohydrate intake? - No ?Neuropathy ? - No ?Hypoglycemic events - No ? - Recovers with :  ? ?Pertinent ROS:  ?Polyuria - No ?Polydipsia - No ?Vision problems - No ?Management of HTN- Denies shortness of breath, headaches, chest pain or lower extremity edema  ?Medications as noted below. Taking them regularly without complication/adverse reaction being reported today.  ? ?History ?Corey Graham has a past medical history of Diabetes mellitus without complication (HCC) (2012), Essential hypertension (09/09/2017), and Low back pain (06/24/2017).  ? ?He has a past surgical history that includes Hernia repair.  ? ?His family history is not on file.He reports that he has never smoked. He has never used smokeless tobacco. He reports current alcohol use. He reports that he does not use drugs. ? ?Current Outpatient Medications on File Prior to Visit  ?Medication Sig Dispense Refill  ? albuterol (VENTOLIN HFA) 108 (90 Base) MCG/ACT inhaler Inhale 2 puffs into the lungs every 6 (six) hours as needed for wheezing or shortness of breath. 18 g 2  ? Dulaglutide (TRULICITY) 3 MG/0.5ML SOPN Inject 3 mg as directed once a week. 2 mL 3  ? fenofibrate (TRICOR) 145 MG tablet TAKE 1 TABLET (145 MG TOTAL) BY MOUTH DAILY. 90 tablet 2  ? fluticasone (FLOVENT HFA) 110 MCG/ACT inhaler Inhale 2 puffs into the lungs 2 (two) times daily. 12 g 12  ? hydrochlorothiazide (HYDRODIURIL) 25 MG tablet TAKE 1 TABLET (25 MG TOTAL) BY MOUTH DAILY. 90 tablet 3  ? meloxicam  (MOBIC) 15 MG tablet Take 1 tablet (15 mg total) by mouth daily. 30 tablet 1  ? pantoprazole (PROTONIX) 40 MG tablet Take 1 tablet (40 mg total) by mouth daily. 30 tablet 3  ? TRUEplus Lancets 28G MISC USE TO CHECK BLOOD SUGAR ONCE DAILY. 100 each 2  ? [DISCONTINUED] ezetimibe (ZETIA) 10 MG tablet Take 1 tablet (10 mg total) by mouth daily. 90 tablet 3  ? [DISCONTINUED] pravastatin (PRAVACHOL) 80 MG tablet Take 1 tablet (80 mg total) by mouth daily. 90 tablet 1  ? ?No current facility-administered medications on file prior to visit.  ? ? ?ROS ?Comprehensive ROS Pertinent positive and negative noted in HPI   ? ?Objective:  ?BP 136/88 (BP Location: Right Arm, Patient Position: Sitting, Cuff Size: Normal)   Pulse 90   Temp 97.7 ?F (36.5 ?C) (Oral)   Ht 5\' 1"  (1.549 m)   Wt 170 lb (77.1 kg)   SpO2 95%   BMI 32.12 kg/m?  ? ?BP Readings from Last 3 Encounters:  ?03/28/22 136/88  ?02/12/22 (!) 149/94  ?01/23/22 (!) 144/88  ? ? ?Wt Readings from Last 3 Encounters:  ?03/28/22 170 lb (77.1 kg)  ?02/12/22 172 lb 12.8 oz (78.4 kg)  ?01/23/22 172 lb 6.4 oz (78.2 kg)  ? ? ?Physical Exam ?Vitals reviewed.  ?Constitutional:   ?   Appearance: He is obese.  ?HENT:  ?   Head: Normocephalic.  ?   Right Ear: Tympanic membrane normal.  ?  Left Ear: Tympanic membrane normal.  ?   Nose: Nose normal.  ?Eyes:  ?   Extraocular Movements: Extraocular movements intact.  ?   Conjunctiva/sclera: Conjunctivae normal.  ?   Pupils: Pupils are equal, round, and reactive to light.  ?Cardiovascular:  ?   Rate and Rhythm: Normal rate and regular rhythm.  ?Pulmonary:  ?   Effort: Pulmonary effort is normal.  ?   Breath sounds: Normal breath sounds.  ?Abdominal:  ?   General: Bowel sounds are normal. There is distension.  ?   Palpations: Abdomen is soft.  ?Musculoskeletal:     ?   General: Normal range of motion.  ?   Cervical back: Normal range of motion and neck supple.  ?Skin: ?   General: Skin is warm and dry.  ?Neurological:  ?   Mental  Status: He is alert and oriented to person, place, and time.  ?Psychiatric:     ?   Mood and Affect: Mood normal.     ?   Behavior: Behavior normal.     ?   Thought Content: Thought content normal.     ?   Judgment: Judgment normal.  ? ?Lab Results  ?Component Value Date  ? HGBA1C 9.3 (A) 03/28/2022  ? HGBA1C 10.3 (A) 11/29/2021  ? HGBA1C 10.2 (A) 08/16/2021  ? ? ?Lab Results  ?Component Value Date  ? WBC 8.5 02/09/2021  ? HGB 15.4 02/09/2021  ? HCT 45.0 02/09/2021  ? PLT 272 02/09/2021  ? GLUCOSE 87 02/09/2021  ? CHOL 223 (H) 02/09/2021  ? TRIG 188 (H) 02/09/2021  ? HDL 47 02/09/2021  ? LDLCALC 142 (H) 02/09/2021  ? ALT 33 02/09/2021  ? AST 31 02/09/2021  ? NA 140 02/09/2021  ? K 3.8 02/09/2021  ? CL 100 02/09/2021  ? CREATININE 0.74 (L) 02/09/2021  ? BUN 11 02/09/2021  ? CO2 24 02/09/2021  ? HGBA1C 9.3 (A) 03/28/2022  ? ? ? ?Assessment & Plan:  ? ?Graham was seen today for diabetes. ? ?Diagnoses and all orders for this visit: ? ?Uncontrolled type 2 diabetes mellitus with hyperglycemia, without long-term current use of insulin (HCC) ?-     HgB A1c 9.3  ?Discussed  co- morbidities with uncontrol diabetes  Complications -diabetic retinopathy, (close your eyes ? What do you see nothing) nephropathy decrease in kidney function- can lead to dialysis-on a machine 3 days a week to filter your kidney, neuropathy- numbness and tinging in your hands and feet,  increase risk of heart attack and stroke, and amputation due to decrease wound healing and circulation. Decrease your risk by taking medication daily as prescribed, monitor carbohydrates- foods that are high in carbohydrates are the following rice, potatoes, breads, sugars, and pastas.  Reduction in the intake (eating) will assist in lowering your blood sugars. Exercise daily at least 30 minutes daily.  ?Has not had Tulicity since November.  ?-     HgB A1c ?-     glipiZIDE (GLUCOTROL) 10 MG tablet; Take 1 tablet by mouth twice daily with meals ?-     rosuvastatin  (CRESTOR) 20 MG tablet; TAKE 1 TABLET (20 MG TOTAL) BY MOUTH DAILY. ?Diabetic eye exam (HCC) ?-     Ambulatory referral to Ophthalmology ? ?Essential hypertension ?We have discussed target BP range and blood pressure goal. I have advised patient to check BP regularly and to call us back or report to clinic if the numbers are consistently higher than 140/90. We discussed the importance of  compliance with medical therapy and DASH diet recommended, consequences of uncontrolled hypertension discussed.  ?- continue current BP medications  ?-     amLODipine (NORVASC) 10 MG tablet; TAKE 1 TABLET (10 MG TOTAL) BY MOUTH DAILY. ?-     losartan (COZAAR) 25 MG tablet; Take 1 tablet (25 mg total) by mouth daily. ? ?Medication refill ?-     amLODipine (NORVASC) 10 MG tablet; TAKE 1 TABLET (10 MG TOTAL) BY MOUTH DAILY. ?-     glipiZIDE (GLUCOTROL) 10 MG tablet; Take 1 tablet by mouth twice daily with meals ? ?Other orders ?-     metFORMIN (GLUCOPHAGE) 1000 MG tablet; Take 1 tablet (1,000 mg total) by mouth 2 (two) times daily with a meal. ? ?  ?I have discontinued Westley Foots Graham's senna, gabapentin, and predniSONE. I have also changed his glipiZIDE. Additionally, I am having him maintain his fenofibrate, TRUEplus Lancets 28G, hydrochlorothiazide, Trulicity, albuterol, fluticasone, pantoprazole, meloxicam, metFORMIN, amLODipine, losartan, and rosuvastatin. ? ?Meds ordered this encounter  ?Medications  ? metFORMIN (GLUCOPHAGE) 1000 MG tablet  ?  Sig: Take 1 tablet (1,000 mg total) by mouth 2 (two) times daily with a meal.  ?  Dispense:  180 tablet  ?  Refill:  1  ? amLODipine (NORVASC) 10 MG tablet  ?  Sig: TAKE 1 TABLET (10 MG TOTAL) BY MOUTH DAILY.  ?  Dispense:  90 tablet  ?  Refill:  3  ? glipiZIDE (GLUCOTROL) 10 MG tablet  ?  Sig: Take 1 tablet by mouth twice daily with meals  ?  Dispense:  180 tablet  ?  Refill:  1  ? losartan (COZAAR) 25 MG tablet  ?  Sig: Take 1 tablet (25 mg total) by mouth daily.  ?  Dispense:   90 tablet  ?  Refill:  1  ? rosuvastatin (CRESTOR) 20 MG tablet  ?  Sig: TAKE 1 TABLET (20 MG TOTAL) BY MOUTH DAILY.  ?  Dispense:  30 tablet  ?  Refill:  2  ? ? ? ?Follow-up:  ? ?Return in about 3 months (around 7/12/

## 2022-04-04 ENCOUNTER — Other Ambulatory Visit: Payer: Self-pay

## 2022-04-20 ENCOUNTER — Other Ambulatory Visit: Payer: Self-pay

## 2022-06-29 ENCOUNTER — Ambulatory Visit (INDEPENDENT_AMBULATORY_CARE_PROVIDER_SITE_OTHER): Payer: Self-pay | Admitting: Primary Care

## 2022-10-16 ENCOUNTER — Other Ambulatory Visit: Payer: Self-pay | Admitting: Critical Care Medicine

## 2022-10-16 ENCOUNTER — Ambulatory Visit: Payer: Self-pay | Admitting: Physician Assistant

## 2022-10-16 ENCOUNTER — Encounter: Payer: Self-pay | Admitting: Physician Assistant

## 2022-10-16 ENCOUNTER — Other Ambulatory Visit: Payer: Self-pay

## 2022-10-16 VITALS — BP 134/93 | HR 87 | Temp 96.3°F | Wt 163.0 lb

## 2022-10-16 DIAGNOSIS — J4541 Moderate persistent asthma with (acute) exacerbation: Secondary | ICD-10-CM

## 2022-10-16 DIAGNOSIS — E1165 Type 2 diabetes mellitus with hyperglycemia: Secondary | ICD-10-CM

## 2022-10-16 MED ORDER — AZITHROMYCIN 250 MG PO TABS
ORAL_TABLET | ORAL | 0 refills | Status: AC
  Filled 2022-10-16: qty 6, 5d supply, fill #0

## 2022-10-16 MED ORDER — PREDNISONE 20 MG PO TABS
20.0000 mg | ORAL_TABLET | Freq: Every day | ORAL | 0 refills | Status: AC
  Filled 2022-10-16: qty 5, 5d supply, fill #0

## 2022-10-16 MED ORDER — AMOXICILLIN-POT CLAVULANATE 875-125 MG PO TABS
1.0000 | ORAL_TABLET | Freq: Two times a day (BID) | ORAL | 0 refills | Status: DC
  Filled 2022-10-16: qty 20, 10d supply, fill #0

## 2022-10-16 NOTE — Patient Instructions (Addendum)
I strongly encourage you to take all of your medications as directed.  To help with your symptoms, you are going to take 2 different antibiotics, short course of steroids, and I encourage you to stay very well-hydrated, and get plenty of rest.  You can continue using ibuprofen as needed to help with body aches.  I hope that you feel better soon, please let us know if there is anything else we can do for you  Roney Jaffe, PA-C Physician Assistant [facility] [email or web address]   Infeccin de las vas respiratorias superiores en adultos Upper Respiratory Infection, Adult Una infeccin de las vas respiratorias superiores (IVRS) es una infeccin viral comn de la [place], garganta y de las vas respiratorias superiores que conducen el aire a los pulmones. El tipo ms comn de IVRS es el resfro comn. Las IVRS generalmente mejoran solas, sin tratamiento mdico. Cules son las causas? La causa es un virus. Se puede contagiar este virus: Al aspirar las gotitas que una persona infectada elimina al toser o Engineering geologist. Tocar algo que estuvo expuesto al virus (est contaminado) y luego tocarse la boca, la nariz o los ojos. Qu incrementa el riesgo? Es ms propenso a Health and safety inspector IVRS si: Es muy pequeo o de edad muy [place]. Tiene contacto cercano con otros, como en el [place], la escuela o un centro de atencin mdica. Fuma. Tiene una enfermedad cardaca o pulmonar a largo plazo (crnica). Tiene debilitado el sistema encargado de combatir las enfermedades (sistema inmunitario). Tiene asma o alergias nasales. Est sufriendo mucho estrs. Tiene un dficit nutricional. Cules son los signos o sntomas? La IVRS suele presentar alguno de los siguientes sntomas: Secrecin nasal o nariz tapada (congestin). Tos. Estornudos. Dolor de Advertising copywriter. Dolor de [place]. Fatiga. Grant Ruts. Prdida del apetito. Dolor en la frente, detrs de los  ojos y por encima de los pmulos (dolor sinusal). Dolores musculares. Enrojecimiento o irritacin de los ojos. Presin en los odos o la cara. Cmo se diagnostica? Esta afeccin se puede diagnosticar en funcin de los antecedentes mdicos, los sntomas y un examen fsico. El mdico puede usar un hisopo para tomar una muestra de mucosidad de la nariz (hisopado nasal). Esta muestra puede analizarse para determinar qu virus est provocando la enfermedad. Cmo se trata? Las IVRS generalmente mejoran por s solas en un perodo de entre 7 y [address]. Los medicamentos no curan las IVRS, Biomedical engineer el mdico puede recomendarle ciertos medicamentos para ayudar a [employer], como por ejemplo: Medicamentos para la tos de [place]. Antitusivos. Toser es un tipo de defensa contra una infeccin que ayuda a limpiar el sistema respiratorio, de modo que tome estos medicamentos solo segn se lo recomiende el mdico. Medicamentos para bajar la fiebre. Siga estas instrucciones en su casa: Actividad Descanse todo lo que sea necesario. Si tiene fiebre, qudese en su casa, es decir, no vaya al trabajo o la escuela, hasta que no tenga fiebre o hasta que el mdico le diga que la IVRS ya no se puede diseminar a Economist (ya no [place]). El mdico puede pedirle que use una mscara facial para evitar que disemine la infeccin. Para aliviar los sntomas [place]aga grgaras con una mezcla de agua y sal 3 o 4 veces al da, o segn sea necesario. Para preparar agua con sal, disuelva totalmente de  a 1 cucharadita (de 3 a 6 g) de sal en 1 taza (237 ml) de agua tibia. Use un humidificador de aire fro para agregar humedad al aire. Esto  puede ayudarlo a que respire mejor. Comida y bebida  Beba suficiente lquido como para Theatre manager la orina de color amarillo plido. Tome sopas y caldos transparentes. Instrucciones generales  Use los medicamentos de venta libre y los recetados solamente como se lo haya indicado el  mdico. Estos incluyen medicamentos para el resfro, para bajar la fiebre y antitusivos. No consuma ningn producto que contenga nicotina o tabaco. Estos productos incluyen cigarrillos, tabaco para Higher education careers adviser y aparatos de vapeo, como los Psychologist, sport and exercise. Si necesita ayuda para dejar de fumar, consulte al mdico. Aljese del humo de otros fumadores. Mantngase al da con todas las vacunas, incluso la vacuna anual (una vez al ao) contra la gripe. Concurra a [place]. Esto es importante. Cmo evitar contagiar la infeccin a otros Las IVRS pueden ser [employer]. Para evitar que la infeccin se propague, tome las siguientes medidas: Lvese las manos con agua y jabn durante al menos 20 segundos. Use desinfectante para manos si no dispone de [place] y [place]. Evite tocarse la boca, la cara, los ojos o la [place]. Tosa o estornude en un pauelo de papel o en su manga o codo en lugar de hacerlo en la mano o en el aire.  Comunquese con un mdico si: Empeora en lugar de mejorar. Tiene fiebre o escalofros. Tiene mucosidad marrn o roja. Tiene una secrecin amarilla o marrn de la Lawyer. Le duele la cara, especialmente al inclinarse hacia adelante. Tiene los ganglios del cuello hinchados. Siente dolor al tragar. Tiene zonas blancas en la parte de atrs de la garganta. Solicite ayuda de inmediato si: La falta de aire empeora. Tiene sntomas intensos o persistentes de: Dolor de [place]. Dolor de odo. Dolor sinusal. Dolor de pecho. Tiene enfermedad pulmonar crnica junto con cualquiera de estos sntomas: Emitir sonidos de silbidos agudos al respirar, ms a menudo al exhalar (sibilancias). Tos prolongada (ms de [address]). Tos con sangre. Cambio en la mucosidad habitual. Tiene rigidez en el cuello. Tiene cambios en: La visin. La audicin. El razonamiento. El [place] de nimo. Estos sntomas pueden Sales executive. Solicite ayuda de inmediato. Llame al 911. No  espere a ver si los sntomas desaparecen. No conduzca por sus propios medios Principal Financial. Resumen Una infeccin de las vas respiratorias superiores (IVRS) es una infeccin comn de la nariz, la garganta y las vas respiratorias superiores que conducen el aire a los pulmones. La causa es un virus. Las IVRS generalmente mejoran por s solas en un perodo de entre 7 y [place]. Los medicamentos no curan las IVRS, pero el mdico puede recomendarle ciertos medicamentos para ayudar a [employer]. Esta informacin no tiene Marine scientist el consejo del mdico. Asegrese de hacerle al mdico cualquier pregunta que tenga. Document Revised: 07/31/2021 Document Reviewed: 07/31/2021 Elsevier Patient Education  [place].

## 2022-10-16 NOTE — Progress Notes (Signed)
   Established Patient Office Visit  Subjective   Patient ID: Corey Graham, male    DOB: October 12, 1976  Age: 46 y.o. MRN: [id number]  Chief Complaint  Patient presents with  . URI    States that he started poorly one week ago,  States that he has been having a productive cough with yellowish sputum. Endorses body aches and sore throat. Denies fever, ear pain.  States that he is eating and drinking okay.  States that he tried ibuprofen without much relief.  Denies sick contacts.  Did not take home COVID test. Has not used flovent (started last two days )  Combination therapy:  Augmentin 875/125mg  BID for 5 days (Alternatives: Cefpodoxime 200mg  BID, or Cefuroxime 500mg  daily) PLUS  Azithromycin 500mg  Day 1 then 250mg  daily for days 2-5 OR Doxycycline 100mg  BID for 5 days OR  Past Medical History:  Diagnosis Date  . Diabetes mellitus without complication ([place]) 4403  . Essential hypertension 09/09/2017  . Low back pain 06/24/2017   Social History   Socioeconomic History  . Marital status: Married    Spouse name: Not on file  . Number of children: 3  . Years of education: Not on file  . Highest education level: Not on file  Occupational History  . Not on file  Tobacco Use  . Smoking status: Never  . Smokeless tobacco: Never  Substance and Sexual Activity  . Alcohol use: Yes    Comment: infrequent  . Drug use: No  . Sexual activity: Yes  Other Topics Concern  . Not on file  Social History Narrative   LIves at home with wife and 3 children   Social Determinants of Health   Financial Resource Strain: Not on file  Food Insecurity: Not on file  Transportation Needs: Not on file  Physical Activity: Not on file  Stress: Not on file  Social Connections: Not on file  Intimate Partner Violence: Not on file   History reviewed. No pertinent family history. No Known Allergies  ROS    Objective:     There were no vitals taken for this visit.    Physical  Exam    Assessment & Plan:   Problem List Items Addressed This Visit   None   No follow-ups on file.    Loraine Grip Mayers, PA-C

## 2022-10-16 NOTE — Telephone Encounter (Signed)
Requested medication (s) are due for refill today: yes  Requested medication (s) are on the active medication list: yes  Last refill:  02/12/22 #18g 2 refills  Future visit scheduled: yes in 3 weeks  Notes to clinic:  last ordered by P. Joya Gaskins, MD 02/12/22. Do you want to refill Rx?     Requested Prescriptions  Pending Prescriptions Disp Refills   albuterol (VENTOLIN HFA) 108 (90 Base) MCG/ACT inhaler 18 g 1    Sig: Inhale 2 puffs into the lungs every 6 (six) hours as needed for wheezing or shortness of breath.     Pulmonology:  Beta Agonists 2 Failed - 10/16/2022  1:10 PM      Failed - Last BP in normal range    BP Readings from Last 1 Encounters:  10/16/22 (!) 134/93         Passed - Last Heart Rate in normal range    Pulse Readings from Last 1 Encounters:  10/16/22 87         Passed - Valid encounter within last 12 months    Recent Outpatient Visits           6 months ago Uncontrolled type 2 diabetes mellitus with hyperglycemia, without long-term current use of insulin ([place])   [place] [facility] Kerin Perna, NP   8 months ago Moderate persistent asthma with acute exacerbation   [place] Elsie Stain, MD   8 months ago Groin pain, left   [facility] Juluis Mire P, NP   10 months ago Uncontrolled type 2 diabetes mellitus with hyperglycemia, without long-term current use of insulin ([place])   [place], Michelle P, NP   1 year ago Uncontrolled type 2 diabetes mellitus with hyperglycemia, without long-term current use of insulin ([place])   [place], [place], NP       Future Appointments             In 3 weeks Oletta Lamas Milford Cage, NP [place]

## 2022-10-19 ENCOUNTER — Other Ambulatory Visit: Payer: Self-pay

## 2022-10-19 MED ORDER — ALBUTEROL SULFATE HFA 108 (90 BASE) MCG/ACT IN AERS
2.0000 | INHALATION_SPRAY | Freq: Four times a day (QID) | RESPIRATORY_TRACT | 1 refills | Status: DC | PRN
Start: 2022-10-19 — End: 2022-12-05
  Filled 2022-10-19 – 2022-11-05 (×2): qty 6.7, 25d supply, fill #0

## 2022-10-26 ENCOUNTER — Other Ambulatory Visit: Payer: Self-pay

## 2022-11-05 ENCOUNTER — Other Ambulatory Visit (INDEPENDENT_AMBULATORY_CARE_PROVIDER_SITE_OTHER): Payer: Self-pay | Admitting: Primary Care

## 2022-11-05 ENCOUNTER — Other Ambulatory Visit: Payer: Self-pay

## 2022-11-05 DIAGNOSIS — I10 Essential (primary) hypertension: Secondary | ICD-10-CM

## 2022-11-05 DIAGNOSIS — E782 Mixed hyperlipidemia: Secondary | ICD-10-CM

## 2022-11-05 DIAGNOSIS — Z760 Encounter for issue of repeat prescription: Secondary | ICD-10-CM

## 2022-11-06 ENCOUNTER — Ambulatory Visit (INDEPENDENT_AMBULATORY_CARE_PROVIDER_SITE_OTHER): Payer: PRIVATE HEALTH INSURANCE | Admitting: Primary Care

## 2022-11-06 NOTE — Telephone Encounter (Signed)
Requested medication (s) are due for refill today:   Yes for all 3    Requested medication (s) are on the active medication list:   Yes for all 3  Future visit scheduled:   Yes 12/05/2022   Had appt. For today 11/06/2022 but pt. Cancelled it.   Was a No Show for 06/29/2022.  Per note from [facility] pt. Has not been taking his medications daily.  All rx from 2022   Last ordered: Tricor 05/17/2021 #90, 2 refills;  HCTZ 08/16/2021 #90, 3 refills;   Trulicity 58/04/2777 2 ml, 3 refills  Returned because unable to refill due to protocol criteria not being met.    Requested Prescriptions  Pending Prescriptions Disp Refills   fenofibrate (TRICOR) 145 MG tablet 90 tablet 2    Sig: TAKE 1 TABLET (145 MG TOTAL) BY MOUTH DAILY.     Cardiovascular:  Antilipid - Fibric Acid Derivatives Failed - 11/05/2022 12:20 PM      Failed - ALT in normal range and within 360 days    ALT  Date Value Ref Range Status  02/09/2021 33 0 - 44 IU/L Final         Failed - AST in normal range and within 360 days    AST  Date Value Ref Range Status  02/09/2021 31 0 - 40 IU/L Final         Failed - Cr in normal range and within 360 days    Creatinine, Ser  Date Value Ref Range Status  02/09/2021 0.74 (L) 0.76 - 1.27 mg/dL Final    Comment:                   **Effective February 13, 2021 [facility] will begin**                  reporting the 2021 CKD-EPI creatinine equation that                  estimates kidney function without a race variable.          Failed - HGB in normal range and within 360 days    Hemoglobin  Date Value Ref Range Status  02/09/2021 15.4 13.0 - 17.7 g/dL Final         Failed - HCT in normal range and within 360 days    Hematocrit  Date Value Ref Range Status  02/09/2021 45.0 37.5 - 51.0 % Final         Failed - PLT in normal range and within 360 days    Platelets  Date Value Ref Range Status  02/09/2021 272 150 - 450 x10E3/uL Final         Failed - WBC in normal range and  within 360 days    WBC  Date Value Ref Range Status  02/09/2021 8.5 3.4 - 10.8 x10E3/uL Final         Failed - eGFR is 30 or above and within 360 days    GFR calc Af Amer  Date Value Ref Range Status  02/09/2021 131 >59 mL/min/1.73 Final    Comment:    **In accordance with recommendations from the NKF-ASN Task force,**   [facility] is in the process of updating its eGFR calculation to the   2021 CKD-EPI creatinine equation that estimates kidney function   without a race variable.    GFR calc non Af Amer  Date Value Ref Range Status  02/09/2021 113 >59 mL/min/1.73 Final  Failed - Lipid Panel in normal range within the last 12 months    Cholesterol, Total  Date Value Ref Range Status  02/09/2021 223 (H) 100 - 199 mg/dL Final   LDL Chol Calc (NIH)  Date Value Ref Range Status  02/09/2021 142 (H) 0 - 99 mg/dL Final   HDL  Date Value Ref Range Status  02/09/2021 47 >39 mg/dL Final   Triglycerides  Date Value Ref Range Status  02/09/2021 188 (H) 0 - 149 mg/dL Final         Passed - Valid encounter within last 12 months    Recent Outpatient Visits           7 months ago Uncontrolled type 2 diabetes mellitus with hyperglycemia, without long-term current use of insulin ([place])   [place] [facility] Kerin Perna, NP   8 months ago Moderate persistent asthma with acute exacerbation   [place] Elsie Stain, MD   9 months ago Groin pain, left   [facility] Juluis Mire P, NP   11 months ago Uncontrolled type 2 diabetes mellitus with hyperglycemia, without long-term current use of insulin ([place])   [place] Juluis Mire P, NP   1 year ago Uncontrolled type 2 diabetes mellitus with hyperglycemia, without long-term current use of insulin ([place])   [place] [facility] Kerin Perna, NP       Future Appointments             In 4 weeks  Oletta Lamas, Milford Cage, NP [facility]             hydrochlorothiazide (HYDRODIURIL) 25 MG tablet 90 tablet 3    Sig: TAKE 1 TABLET (25 MG TOTAL) BY MOUTH DAILY.     Cardiovascular: Diuretics - Thiazide Failed - 11/05/2022 12:20 PM      Failed - Cr in normal range and within 180 days    Creatinine, Ser  Date Value Ref Range Status  02/09/2021 0.74 (L) 0.76 - 1.27 mg/dL Final    Comment:                   **Effective February 13, 2021 [facility] will begin**                  reporting the 2021 CKD-EPI creatinine equation that                  estimates kidney function without a race variable.          Failed - K in normal range and within 180 days    Potassium  Date Value Ref Range Status  02/09/2021 3.8 3.5 - 5.2 mmol/L Final         Failed - Na in normal range and within 180 days    Sodium  Date Value Ref Range Status  02/09/2021 140 134 - 144 mmol/L Final         Failed - Last BP in normal range    BP Readings from Last 1 Encounters:  10/16/22 (!) 134/93         Failed - Valid encounter within last 6 months    Recent Outpatient Visits           7 months ago Uncontrolled type 2 diabetes mellitus with hyperglycemia, without long-term current use of insulin ([place])   [facility] Kerin Perna, NP  8 months ago Moderate persistent asthma with acute exacerbation   [place] Elsie Stain, MD   9 months ago Groin pain, left   [facility] Juluis Mire P, NP   11 months ago Uncontrolled type 2 diabetes mellitus with hyperglycemia, without long-term current use of insulin ([place])   [place] Kerin Perna, NP   1 year ago Uncontrolled type 2 diabetes mellitus with hyperglycemia, without long-term current use of insulin ([place])   [place] [facility] Kerin Perna, NP       Future Appointments             In 4  weeks [place], Milford Cage, NP [facility]             Dulaglutide (TRULICITY) 3 BO/4.7QS SOPN 2 mL 3    Sig: Inject 3 mg as directed once a week.     Endocrinology:  Diabetes - GLP-1 Receptor Agonists Failed - 11/05/2022 12:20 PM      Failed - HBA1C is between 0 and 7.9 and within 180 days    Hemoglobin A1C  Date Value Ref Range Status  03/28/2022 9.3 (A) 4.0 - 5.6 % Final   HbA1c, POC (controlled diabetic range)  Date Value Ref Range Status  11/09/2020 10.3 (A) 0.0 - 7.0 % Final         Failed - Valid encounter within last 6 months    Recent Outpatient Visits           7 months ago Uncontrolled type 2 diabetes mellitus with hyperglycemia, without long-term current use of insulin ([place])   [place] [facility] Kerin Perna, NP   8 months ago Moderate persistent asthma with acute exacerbation   [place] Elsie Stain, MD   9 months ago Groin pain, left   [facility] Juluis Mire P, NP   11 months ago Uncontrolled type 2 diabetes mellitus with hyperglycemia, without long-term current use of insulin ([place])   [place], [place], NP   1 year ago Uncontrolled type 2 diabetes mellitus with hyperglycemia, without long-term current use of insulin ([place])   [place], [place], NP       Future Appointments             In 4 weeks Oletta Lamas Milford Cage, NP [place]

## 2022-11-07 ENCOUNTER — Other Ambulatory Visit: Payer: Self-pay

## 2022-11-20 ENCOUNTER — Ambulatory Visit: Payer: Self-pay | Attending: Family Medicine

## 2022-12-05 ENCOUNTER — Ambulatory Visit (INDEPENDENT_AMBULATORY_CARE_PROVIDER_SITE_OTHER): Payer: PRIVATE HEALTH INSURANCE | Admitting: Primary Care

## 2022-12-05 ENCOUNTER — Other Ambulatory Visit (HOSPITAL_BASED_OUTPATIENT_CLINIC_OR_DEPARTMENT_OTHER): Payer: Self-pay

## 2022-12-05 ENCOUNTER — Encounter (INDEPENDENT_AMBULATORY_CARE_PROVIDER_SITE_OTHER): Payer: Self-pay | Admitting: Primary Care

## 2022-12-05 ENCOUNTER — Other Ambulatory Visit: Payer: Self-pay

## 2022-12-05 VITALS — BP 165/96 | HR 71 | Ht 61.0 in | Wt 165.0 lb

## 2022-12-05 DIAGNOSIS — M545 Low back pain, unspecified: Secondary | ICD-10-CM

## 2022-12-05 DIAGNOSIS — I10 Essential (primary) hypertension: Secondary | ICD-10-CM

## 2022-12-05 DIAGNOSIS — Z760 Encounter for issue of repeat prescription: Secondary | ICD-10-CM

## 2022-12-05 DIAGNOSIS — G8929 Other chronic pain: Secondary | ICD-10-CM

## 2022-12-05 DIAGNOSIS — E1165 Type 2 diabetes mellitus with hyperglycemia: Secondary | ICD-10-CM

## 2022-12-05 LAB — POCT GLYCOSYLATED HEMOGLOBIN (HGB A1C): HbA1c, POC (controlled diabetic range): 12 % — AB (ref 0.0–7.0)

## 2022-12-05 LAB — GLUCOSE, POCT (MANUAL RESULT ENTRY): POC Glucose: 201 mg/dl — AB (ref 70–99)

## 2022-12-05 MED ORDER — GLIPIZIDE 10 MG PO TABS
10.0000 mg | ORAL_TABLET | Freq: Two times a day (BID) | ORAL | 1 refills | Status: DC
  Filled 2022-12-05: qty 60, 30d supply, fill #0
  Filled 2023-02-13: qty 60, 30d supply, fill #1

## 2022-12-05 MED ORDER — LOSARTAN POTASSIUM 25 MG PO TABS
25.0000 mg | ORAL_TABLET | Freq: Every day | ORAL | 1 refills | Status: DC
  Filled 2022-12-05: qty 30, 30d supply, fill #0

## 2022-12-05 MED ORDER — HYDROCHLOROTHIAZIDE 25 MG PO TABS
25.0000 mg | ORAL_TABLET | Freq: Every day | ORAL | 3 refills | Status: DC
  Filled 2022-12-05: qty 30, 30d supply, fill #0
  Filled 2023-02-13: qty 30, 30d supply, fill #1
  Filled 2023-05-09 – 2023-05-21 (×2): qty 30, 30d supply, fill #2

## 2022-12-05 MED ORDER — METFORMIN HCL 1000 MG PO TABS
1000.0000 mg | ORAL_TABLET | Freq: Two times a day (BID) | ORAL | 1 refills | Status: DC
  Filled 2022-12-05: qty 60, 30d supply, fill #0
  Filled 2023-02-13: qty 60, 30d supply, fill #1

## 2022-12-05 MED ORDER — TRULICITY 3 MG/0.5ML ~~LOC~~ SOAJ
3.0000 mg | SUBCUTANEOUS | 3 refills | Status: DC
  Filled 2022-12-05: qty 2, 28d supply, fill #0
  Filled 2023-01-08 (×2): qty 2, 28d supply, fill #1

## 2022-12-05 MED ORDER — SENNOSIDES 8.6 MG PO TABS
1.0000 | ORAL_TABLET | Freq: Every day | ORAL | 1 refills | Status: DC
  Filled 2022-12-05: qty 100, 100d supply, fill #0

## 2022-12-05 MED ORDER — ALBUTEROL SULFATE HFA 108 (90 BASE) MCG/ACT IN AERS
2.0000 | INHALATION_SPRAY | Freq: Four times a day (QID) | RESPIRATORY_TRACT | 1 refills | Status: DC | PRN
  Filled 2022-12-05: qty 6.7, 25d supply, fill #0
  Filled 2023-02-06: qty 6.7, 25d supply, fill #1

## 2022-12-05 MED ORDER — AMLODIPINE BESYLATE 10 MG PO TABS
10.0000 mg | ORAL_TABLET | Freq: Every day | ORAL | 3 refills | Status: DC
  Filled 2022-12-05: qty 30, 30d supply, fill #0
  Filled 2023-02-13: qty 30, 30d supply, fill #1
  Filled 2023-05-09 – 2023-05-21 (×2): qty 30, 30d supply, fill #2

## 2022-12-05 NOTE — Progress Notes (Signed)
Corey Graham is a 46 y.o. male presents for hypertension evaluation, Denies shortness of breath, headaches, chest pain or lower extremity edema, sudden onset, vision changes, unilateral weakness, dizziness, paresthesias  Corey Graham [phone] Patient denies adherence with medications.  Dietary habits include: No dietary restrictions Exercise habits include:none Family / Social history: unknown    Past Medical History:  Diagnosis Date   Diabetes mellitus without complication ([place]) 3825   Essential hypertension 09/09/2017   Low back pain 06/24/2017   Past Surgical History:  Procedure Laterality Date   HERNIA REPAIR     No Known Allergies Current Outpatient Medications on File Prior to Visit  Medication Sig Dispense Refill   albuterol (VENTOLIN HFA) 108 (90 Base) MCG/ACT inhaler Inhale 2 puffs into the lungs every 6 (six) hours as needed for wheezing or shortness of breath. 6.7 g 1   amLODipine (NORVASC) 10 MG tablet TAKE 1 TABLET (10 MG TOTAL) BY MOUTH DAILY. 90 tablet 3   amoxicillin-clavulanate (AUGMENTIN) 875-125 MG tablet Take 1 tablet by mouth 2 (two) times daily. 20 tablet 0   fenofibrate (TRICOR) 145 MG tablet TAKE 1 TABLET (145 MG TOTAL) BY MOUTH DAILY. 90 tablet 2   fluticasone (FLOVENT HFA) 110 MCG/ACT inhaler Inhale 2 puffs into the lungs 2 (two) times daily. 12 g 12   glipiZIDE (GLUCOTROL) 10 MG tablet Take 1 tablet by mouth twice daily with meals 180 tablet 1   hydrochlorothiazide (HYDRODIURIL) 25 MG tablet TAKE 1 TABLET (25 MG TOTAL) BY MOUTH DAILY. 90 tablet 3   losartan (COZAAR) 25 MG tablet Take 1 tablet (25 mg total) by mouth daily. 90 tablet 1   metFORMIN (GLUCOPHAGE) 1000 MG tablet Take 1 tablet (1,000 mg total) by mouth 2 (two) times daily with a meal. 180 tablet 1   pantoprazole (PROTONIX) 40 MG tablet Take 1 tablet (40 mg total) by mouth daily. 30 tablet 3   rosuvastatin (CRESTOR) 20 MG tablet TAKE 1 TABLET (20 MG TOTAL) BY  MOUTH DAILY. 30 tablet 2   Dulaglutide (TRULICITY) 3 KN/3.9JQ SOPN Inject 3 mg as directed once a week. (Patient not taking: Reported on 12/05/2022) 2 mL 3   meloxicam (MOBIC) 15 MG tablet Take 1 tablet (15 mg total) by mouth daily. (Patient not taking: Reported on 12/05/2022) 30 tablet 1   [DISCONTINUED] ezetimibe (ZETIA) 10 MG tablet Take 1 tablet (10 mg total) by mouth daily. 90 tablet 3   [DISCONTINUED] pravastatin (PRAVACHOL) 80 MG tablet Take 1 tablet (80 mg total) by mouth daily. 90 tablet 1   No current facility-administered medications on file prior to visit.   Social History   Socioeconomic History   Marital status: Married    Spouse name: Not on file   Number of children: 3   Years of education: Not on file   Highest education level: Not on file  Occupational History   Not on file  Tobacco Use   Smoking status: Never   Smokeless tobacco: Never  Vaping Use   Vaping Use: Never used  Substance and Sexual Activity   Alcohol use: Yes    Comment: infrequent   Drug use: No   Sexual activity: Yes  Other Topics Concern   Not on file  Social History Narrative   [place] at home with wife and 3 children   Social Determinants of Health   Financial Resource Strain: Not on file  Food Insecurity: Not on file  Transportation Needs: Not on file  Physical Activity:  Not on file  Stress: Not on file  Social Connections: Not on file  Intimate Partner Violence: Not on file   No family history on file.   OBJECTIVE:  Vitals:   12/05/22 1043  BP: (Abnormal) 165/96  Pulse: 71  SpO2: 98%  Weight: 165 lb (74.8 kg)  Height: _0  (1.549 m)    Physical exam: General: Vital signs reviewed.  Patient is well-developed and well-nourished, obese male in no acute distress and cooperative with exam. Head: Normocephalic and atraumatic. Eyes: EOMI, conjunctivae normal, no scleral icterus. Neck: Supple, trachea midline, normal ROM, no JVD, masses, thyromegaly, or carotid bruit  present. Cardiovascular: RRR, S1 normal, S2 normal, no murmurs, gallops, or rubs. Pulmonary/Chest: Lower lobe wheeze Abdominal: Soft, non-tender, non-distended, BS +, no masses, organomegaly, or guarding present. Musculoskeletal: No joint deformities, erythema, or stiffness, ROM full and nontender. Extremities: No lower extremity edema bilaterally,  pulses symmetric and intact bilaterally. No cyanosis or clubbing. Neurological: A&O x3, Strength is normal Skin: Warm, dry and intact. No rashes or erythema. Psychiatric: Normal mood and affect. speech and behavior is normal. Cognition and memory are normal.    ROS Comprehensive ROS Pertinent positive and negative noted in HPI   Last 3 Office BP readings: BP Readings from Last 3 Encounters:  12/05/22 (Abnormal) 165/96  10/16/22 (Abnormal) 134/93  03/28/22 136/88    BMET    Component Value Date/Time   NA 140 02/09/2021 1013   K 3.8 02/09/2021 1013   CL 100 02/09/2021 1013   CO2 24 02/09/2021 1013   GLUCOSE 87 02/09/2021 1013   GLUCOSE 378 (H) 06/15/2014 2232   BUN 11 02/09/2021 1013   CREATININE 0.74 (L) 02/09/2021 1013   CALCIUM 9.6 02/09/2021 1013   GFRNONAA 113 02/09/2021 1013   GFRAA 131 02/09/2021 1013    Renal function: CrCl cannot be calculated (Patient's most recent lab result is older than the maximum 21 days allowed.).  Clinical ASCVD: Yes  The 10-year ASCVD risk score (Arnett DK, et al., 2019) is: 9.1%   Values used to calculate the score:     Age: 82 years     Sex: Male     Is Non-Hispanic African American: No     Diabetic: Yes     Tobacco smoker: No     Systolic Blood Pressure: 102 mmHg     Is BP treated: Yes     HDL Cholesterol: 47 mg/dL     Total Cholesterol: 223 mg/dL  ASCVD risk factors include- Corey Graham   ASSESSMENT & PLAN: Corey Graham was seen today for diabetes and hypertension.  Diagnoses and all orders for this visit:  Uncontrolled type 2 diabetes mellitus with hyperglycemia, without long-term current  use of insulin (HCC)  Complications from uncontrolled diabetes -diabetic retinopathy leading to blindness, diabetic nephropathy leading to dialysis, decrease in circulation decrease in sores or wound healing which may lead to amputations and increase of heart attack and stroke  -     POCT glycosylated hemoglobin (Hb A1C) -     POCT glucose (manual entry) -     glipiZIDE (GLUCOTROL) 10 MG tablet; Take 1 tablet (10 mg total) by mouth 2 (two) times daily with a meal. -     Lipid Panel -     Microalbumin / creatinine urine ratio -     CBC with Differential/Platelet -     senna (SENOKOT) 8.6 MG tablet; Take 1 tablet (8.6 mg total) by mouth daily.  Medication refill -  glipiZIDE (GLUCOTROL) 10 MG tablet; Take 1 tablet (10 mg total) by mouth 2 (two) times daily with a meal. -     amLODipine (NORVASC) 10 MG tablet; Take 1 tablet (10 mg total) by mouth daily. -     hydrochlorothiazide (HYDRODIURIL) 25 MG tablet; Take 1 tablet (25 mg total) by mouth daily.  Essential hypertension BP goal - < 130/80 Explained that having normal blood pressure is the goal and medications are helping to get to goal and maintain normal blood pressure. Counseled on lifestyle modifications for blood pressure control including reduced dietary sodium, increased exercise, weight reduction and adequate sleep. Also, educated patient about the risk for cardiovascular events, stroke and heart attack. Also counseled patient about the importance of medication adherence. If you participate in smoking, it is important to stop using tobacco as this will increase the risks associated with uncontrolled blood pressure.  EXERCISE Discussed incorporating exercise such as walking - 30 minutes most days of the week and can do in 10 minute intervals    -     losartan (COZAAR) 25 MG tablet; Take 1 tablet (25 mg total) by mouth daily. -     amLODipine (NORVASC) 10 MG tablet; Take 1 tablet (10 mg total) by mouth daily. -     hydrochlorothiazide  (HYDRODIURIL) 25 MG tablet; Take 1 tablet (25 mg total) by mouth daily. -     CMP14+EGFR  Chronic bilateral low back pain without sciatica -     AMB referral to orthopedics  Other orders -     metFORMIN (GLUCOPHAGE) 1000 MG tablet; Take 1 tablet (1,000 mg total) by mouth 2 (two) times daily with a meal. -     Dulaglutide (TRULICITY) 3 KT/6.2BW SOPN; Inject 3 mg as directed once a week. -     albuterol (VENTOLIN HFA) 108 (90 Base) MCG/ACT inhaler; Inhale 2 puffs into the lungs every 6 (six) hours as needed for wheezing or shortness of breath.      -This note has been created with Surveyor, quantity. Any transcriptional errors are unintentional.   Kerin Perna, NP 12/05/2022, 11:36 AM

## 2022-12-05 NOTE — Progress Notes (Signed)
Lower back pain 

## 2022-12-06 ENCOUNTER — Other Ambulatory Visit: Payer: Self-pay

## 2022-12-06 LAB — LIPID PANEL
Chol/HDL Ratio: 5.5 ratio — ABNORMAL HIGH (ref 0.0–5.0)
Cholesterol, Total: 258 mg/dL — ABNORMAL HIGH (ref 100–199)
HDL: 47 mg/dL (ref >39)
LDL Chol Calc (NIH): 172 mg/dL — ABNORMAL HIGH (ref 0–99)
Triglycerides: 208 mg/dL — ABNORMAL HIGH (ref 0–149)
VLDL Cholesterol Cal: 39 mg/dL (ref 5–40)

## 2022-12-06 LAB — CMP14+EGFR
ALT: 30 IU/L (ref 0–44)
AST: 25 IU/L (ref 0–40)
Albumin/Globulin Ratio: 1.7 (ref 1.2–2.2)
Albumin: 4.6 g/dL (ref 4.1–5.1)
Alkaline Phosphatase: 136 IU/L — ABNORMAL HIGH (ref 44–121)
BUN/Creatinine Ratio: 15 (ref 9–20)
BUN: 9 mg/dL (ref 6–24)
Bilirubin Total: 0.3 mg/dL (ref 0.0–1.2)
CO2: 22 mmol/L (ref 20–29)
Calcium: 9.4 mg/dL (ref 8.7–10.2)
Chloride: 99 mmol/L (ref 96–106)
Creatinine, Ser: 0.6 mg/dL — ABNORMAL LOW (ref 0.76–1.27)
Globulin, Total: 2.7 g/dL (ref 1.5–4.5)
Glucose: 222 mg/dL — ABNORMAL HIGH (ref 70–99)
Potassium: 4.4 mmol/L (ref 3.5–5.2)
Sodium: 137 mmol/L (ref 134–144)
Total Protein: 7.3 g/dL (ref 6.0–8.5)
eGFR: 121 mL/min/{1.73_m2} (ref >59)

## 2022-12-06 LAB — CBC WITH DIFFERENTIAL/PLATELET
Basophils Absolute: 0.2 10*3/uL (ref 0.0–0.2)
Basos: 2 %
EOS (ABSOLUTE): 0.8 10*3/uL — ABNORMAL HIGH (ref 0.0–0.4)
Eos: 10 %
Hematocrit: 48.3 % (ref 37.5–51.0)
Hemoglobin: 16.2 g/dL (ref 13.0–17.7)
Immature Grans (Abs): 0 10*3/uL (ref 0.0–0.1)
Immature Granulocytes: 0 %
Lymphocytes Absolute: 2.4 10*3/uL (ref 0.7–3.1)
Lymphs: 33 %
MCH: 29.6 pg (ref 26.6–33.0)
MCHC: 33.5 g/dL (ref 31.5–35.7)
MCV: 88 fL (ref 79–97)
Monocytes Absolute: 0.4 10*3/uL (ref 0.1–0.9)
Monocytes: 5 %
Neutrophils Absolute: 3.7 10*3/uL (ref 1.4–7.0)
Neutrophils: 50 %
Platelets: 215 10*3/uL (ref 150–450)
RBC: 5.47 x10E6/uL (ref 4.14–5.80)
RDW: 12.5 % (ref 11.6–15.4)
WBC: 7.5 10*3/uL (ref 3.4–10.8)

## 2022-12-06 LAB — MICROALBUMIN / CREATININE URINE RATIO
Creatinine, Urine: 40.1 mg/dL
Microalb/Creat Ratio: 1767 mg/g creat — ABNORMAL HIGH (ref 0–29)
Microalbumin, Urine: 708.4 ug/mL

## 2022-12-11 ENCOUNTER — Other Ambulatory Visit (INDEPENDENT_AMBULATORY_CARE_PROVIDER_SITE_OTHER): Payer: Self-pay | Admitting: Primary Care

## 2022-12-11 ENCOUNTER — Other Ambulatory Visit: Payer: Self-pay

## 2022-12-11 ENCOUNTER — Other Ambulatory Visit: Payer: Self-pay | Admitting: Family Medicine

## 2022-12-11 DIAGNOSIS — E1165 Type 2 diabetes mellitus with hyperglycemia: Secondary | ICD-10-CM

## 2022-12-11 DIAGNOSIS — E0821 Diabetes mellitus due to underlying condition with diabetic nephropathy: Secondary | ICD-10-CM

## 2022-12-11 DIAGNOSIS — Z760 Encounter for issue of repeat prescription: Secondary | ICD-10-CM

## 2022-12-11 MED ORDER — TRUEPLUS LANCETS 28G MISC
Freq: Every day | 2 refills | Status: DC
  Filled 2022-12-11: qty 100, 90d supply, fill #0

## 2022-12-12 ENCOUNTER — Encounter (INDEPENDENT_AMBULATORY_CARE_PROVIDER_SITE_OTHER): Payer: Self-pay | Admitting: Primary Care

## 2022-12-12 ENCOUNTER — Other Ambulatory Visit: Payer: Self-pay | Admitting: Pharmacist

## 2022-12-12 ENCOUNTER — Other Ambulatory Visit: Payer: Self-pay

## 2022-12-12 DIAGNOSIS — E1165 Type 2 diabetes mellitus with hyperglycemia: Secondary | ICD-10-CM

## 2022-12-12 MED ORDER — ONETOUCH VERIO VI STRP
ORAL_STRIP | 3 refills | Status: DC
  Filled 2022-12-12: qty 50, 30d supply, fill #0

## 2022-12-12 MED ORDER — ONETOUCH DELICA LANCETS 33G MISC
3 refills | Status: DC
  Filled 2022-12-12: qty 100, 30d supply, fill #0
  Filled 2023-01-07: qty 100, 33d supply, fill #1

## 2022-12-12 MED ORDER — ONETOUCH VERIO W/DEVICE KIT
PACK | 0 refills | Status: DC
  Filled 2022-12-12: qty 1, 30d supply, fill #0

## 2022-12-12 NOTE — Progress Notes (Signed)
Patient has requested a glucose meter.

## 2022-12-20 ENCOUNTER — Other Ambulatory Visit: Payer: Self-pay

## 2022-12-25 ENCOUNTER — Other Ambulatory Visit: Payer: Self-pay

## 2022-12-25 ENCOUNTER — Ambulatory Visit (INDEPENDENT_AMBULATORY_CARE_PROVIDER_SITE_OTHER): Payer: Self-pay

## 2022-12-25 ENCOUNTER — Ambulatory Visit (INDEPENDENT_AMBULATORY_CARE_PROVIDER_SITE_OTHER): Payer: Self-pay | Admitting: Physician Assistant

## 2022-12-25 DIAGNOSIS — M545 Low back pain, unspecified: Secondary | ICD-10-CM

## 2022-12-25 MED ORDER — MELOXICAM 7.5 MG PO TABS
7.5000 mg | ORAL_TABLET | Freq: Every day | ORAL | 0 refills | Status: DC
  Filled 2022-12-25: qty 30, 30d supply, fill #0

## 2022-12-25 NOTE — Progress Notes (Signed)
Office Visit Note   Patient: Corey Graham           Date of Birth: Feb 23, 1976           MRN: [id number] Visit Date: 12/25/2022              Requested by: Grayce Sessions, NP [address] [place],  [place] [address] PCP: Grayce Sessions, NP  Chief Complaint  Patient presents with   Lower Back - Pain      HPI: Patient was seen today with the assistance of an audio video interpreter.  Patient is a pleasant 47 year old gentleman with a long history of some lower back pain.  Denies any injury.  He did say a couple months ago he slipped at work and in the last 2 months this is gotten significantly worse.  He also was concerned about a small soft tissue mass adjacent to his lumbar spine that is painful.  He has been treated with 800 mg of ibuprofen which helps a little bit.  He says he is starting to have pain going down into his legs and he also just feels his legs are "tired "after he walks a bit.  He is an uncontrolled diabetic with most recent hemoglobin A1c of 12  Assessment & Plan: Visit Diagnoses:  1. Lumbar pain     Plan: Had a long discussion with the patient and his wife.  He does have quite a few degenerative changes in his lumbar spine.  They are concerned about the small tissue mass which is mobile mildly tender and adjacent to the lumbar spine.  Given his long course of continued pain and progression of his symptoms and failure of of conservative treatment including anti-inflammatories and concerns for this soft tissue mass I will order an MRI.  Meantime I will also order physical therapy.  He could follow-up with Dr. Christell Constant once the MRI is completed.  Given his symptoms of fatigue in his leg question whether he has a stenosis.  He is neurologically intact today.  His pain is accentuated with extension of his back.  He does have some mild symptoms with straight leg raise right more than left sensation is at his baseline.  I do have some concerns as his  hemoglobin A1c is 12 would make him in eligible for steroid shots at this time.  I will change his anti-inflammatory from ibuprofen to meloxicam at a lower dose.  I have asked him not to take both medications or other at anti-inflammatories  Follow-Up Instructions: after MRI   Ortho Exam  Patient is alert, oriented, no adenopathy, well-dressed, normal affect, normal respiratory effort.   Imaging: XR Lumbar Spine 2-3 Views  Result Date: 12/25/2022 2 view radiographs of the lumbar spine demonstrate overall well-maintained alignment.  Degenerative changes at throughout lumbar line with loss of joint space and osteophyte formation.  No acute fractures  No images are attached to the encounter.  Labs: Lab Results  Component Value Date   HGBA1C 12.0 (A) 12/05/2022   HGBA1C 9.3 (A) 03/28/2022   HGBA1C 10.3 (A) 11/29/2021     Lab Results  Component Value Date   ALBUMIN 4.6 12/05/2022   ALBUMIN 4.8 02/09/2021   ALBUMIN 4.2 08/16/2020    No results found for: "MG" No results found for: "VD25OH"  No results found for: "PREALBUMIN"    Latest Ref Rng & Units 12/05/2022   12:00 PM 02/09/2021   10:13 AM 08/16/2020  9:19 AM  CBC EXTENDED  WBC 3.4 - 10.8 x10E3/uL 7.5  8.5  7.7   RBC 4.14 - 5.80 x10E6/uL 5.47  5.16  4.75   Hemoglobin 13.0 - 17.7 g/dL 16.2  15.4  14.7   HCT 37.5 - 51.0 % 48.3  45.0  42.9   Platelets 150 - 450 x10E3/uL 215  272  219   NEUT# 1.4 - 7.0 x10E3/uL 3.7  4.7  3.7   Lymph# 0.7 - 3.1 x10E3/uL 2.4  2.7  3.0      There is no height or weight on file to calculate BMI.  Orders:  Orders Placed This Encounter  Procedures   XR Lumbar Spine 2-3 Views   No orders of the defined types were placed in this encounter.    Procedures: No procedures performed  Clinical Data: No additional findings.  ROS:  All other systems negative, except as noted in the HPI. Review of Systems  Objective: Vital Signs: There were no vitals taken for this visit.  Specialty  Comments:  No specialty comments available.  PMFS History: Patient Active Problem List   Diagnosis Date Noted   Moderate persistent asthma with acute exacerbation 02/12/2022   Yeast dermatitis of penis 08/30/2021   Uncircumcised male 08/30/2021   Branch retinal vein occlusion of right eye with macular edema 06/11/2019   Nuclear sclerotic cataract of both eyes 06/11/2019   Retinal edema 06/11/2019   Severe nonproliferative diabetic retinopathy of both eyes with macular edema associated with type 2 diabetes mellitus ([place]) 06/11/2019   Essential hypertension 09/09/2017   Low back pain 06/24/2017   Uncontrolled type 2 diabetes mellitus with hyperglycemia, without long-term current use of insulin ([place]) 12/25/2016   Past Medical History:  Diagnosis Date   Diabetes mellitus without complication ([place]) 3267   Essential hypertension 09/09/2017   Low back pain 06/24/2017    No family history on file.  Past Surgical History:  Procedure Laterality Date   HERNIA REPAIR     Social History   Occupational History   Not on file  Tobacco Use   Smoking status: Never   Smokeless tobacco: Never  Vaping Use   Vaping Use: Never used  Substance and Sexual Activity   Alcohol use: Yes    Comment: infrequent   Drug use: No   Sexual activity: Yes

## 2022-12-30 ENCOUNTER — Ambulatory Visit
Admission: RE | Admit: 2022-12-30 | Discharge: 2022-12-30 | Disposition: A | Discharge status: Home / Self Care | Payer: No Typology Code available for payment source | Source: Ambulatory Visit | Attending: Physician Assistant | Admitting: Physician Assistant

## 2022-12-30 DIAGNOSIS — M545 Low back pain, unspecified: Secondary | ICD-10-CM

## 2023-01-01 ENCOUNTER — Ambulatory Visit: Payer: Self-pay | Attending: Physician Assistant | Admitting: Physical Therapy

## 2023-01-01 ENCOUNTER — Encounter: Payer: Self-pay | Admitting: Physical Therapy

## 2023-01-01 DIAGNOSIS — M545 Low back pain, unspecified: Secondary | ICD-10-CM | POA: Insufficient documentation

## 2023-01-01 DIAGNOSIS — R293 Abnormal posture: Secondary | ICD-10-CM | POA: Insufficient documentation

## 2023-01-01 DIAGNOSIS — M5459 Other low back pain: Secondary | ICD-10-CM | POA: Insufficient documentation

## 2023-01-01 DIAGNOSIS — M6281 Muscle weakness (generalized): Secondary | ICD-10-CM | POA: Insufficient documentation

## 2023-01-01 NOTE — Therapy (Signed)
OUTPATIENT PHYSICAL THERAPY THORACOLUMBAR EVALUATION   Patient Name: Corey Graham MRN: [id number] DOB:1976/08/10, 47 y.o., male Today's Date: 01/01/2023  END OF SESSION:  PT End of Session - 01/01/23 1215     Visit Number 1    Number of Visits 9    Date for PT Re-Evaluation 01/31/23    Authorization Type CAFA - Covered 100%    PT Start Time 1105    PT Stop Time 1147    PT Time Calculation (min) 42 min    Activity Tolerance Patient tolerated treatment well    Behavior During Therapy [facility] for tasks assessed/performed             Past Medical History:  Diagnosis Date   Diabetes mellitus without complication ([place]) 4098   Essential hypertension 09/09/2017   Low back pain 06/24/2017   Past Surgical History:  Procedure Laterality Date   HERNIA REPAIR     Patient Active Problem List   Diagnosis Date Noted   Moderate persistent asthma with acute exacerbation 02/12/2022   Yeast dermatitis of penis 08/30/2021   Uncircumcised male 08/30/2021   Branch retinal vein occlusion of right eye with macular edema 06/11/2019   Nuclear sclerotic cataract of both eyes 06/11/2019   Retinal edema 06/11/2019   Severe nonproliferative diabetic retinopathy of both eyes with macular edema associated with type 2 diabetes mellitus ([place]) 06/11/2019   Essential hypertension 09/09/2017   Low back pain 06/24/2017   Uncontrolled type 2 diabetes mellitus with hyperglycemia, without long-term current use of insulin ([place]) 12/25/2016    PCP:  Kerin Perna, NP    REFERRING PROVIDER: Persons, Bevely Palmer, PA  REFERRING DIAG: M54.50 (ICD-10-CM) - Lumbar pain   Rationale for Evaluation and Treatment: Rehabilitation  THERAPY DIAG:  Other low back pain  Muscle weakness (generalized)  Abnormal posture  ONSET DATE: 12/25/2022  SUBJECTIVE:                                                                                                                                                                                            SUBJECTIVE STATEMENT: Pt with a long history of some lower back pain. Pt reports he slipped at work and in the last 2 months this is gotten significantly worse. 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 Can't do any more of the heavy duty activities due to his back pain. Lately it bothers him if he stands on his feet for too long. Has to be on his feet for 4 hours in the morning and 3 hours in the  afternoon. Reports that his wife will given him a massage and that makes it feel better. Has a certain spot that hurts more than others (unsure if this is on R or L). Saw his orthopedic doctor last week and has his ibuprofen changed to Meloxicam and it helps. Reports sitting slumped over compares to sitting up straight makes it feel better. Denies numbness/tingling. Reports back hurts due to work tasks. Has been thinking about changing his work duties.   Present with in-person interpreter Alis  PERTINENT HISTORY:  PMH: Uncontrolled type 2 diabetes, Diabetic retinopathy, Essential HTN, low back pain   Per orthopedic doctor: Given his symptoms of fatigue in his leg question whether he has a stenosis. He is neurologically intact today. His pain is accentuated with extension of his back.   PAIN:  Are you having pain? Yes: NPRS scale: 4-5/10 Pain location: Low back Pain description: "Just a pain" Aggravating factors: Standing on his feet for too long  Relieving factors: Massage  PRECAUTIONS: None  WEIGHT BEARING RESTRICTIONS: No  FALLS:  Has patient fallen in last 6 months? No   OCCUPATION: Works full time at a farm  PLOF: Independent  PATIENT GOALS: Wants to feel better, get rid of the pain   NEXT MD VISIT:   OBJECTIVE:   DIAGNOSTIC FINDINGS:  MRI lumbar spine: 12/31/22  IMPRESSION: 1. Small central disc protrusion with facet hypertrophy at L3-4 with resultant mild canal and bilateral  subarticular stenosis, with mild left L3 foraminal narrowing. 2. Disc bulge with facet hypertrophy at L4-5 with resultant mild to moderate bilateral subarticular stenosis, with mild bilateral L4 foraminal narrowing. 3. Small central disc protrusion at L5-S1 without significant stenosis or impingement. 4. Reactive marrow edema about the L4-5 facets bilaterally due to facet arthritis. Finding could serve as a source for lower back pain. 5. Severe left renal atrophy. No visible paraspinous or soft tissue mass.  PATIENT SURVEYS:  Modified Oswestry 10/50 = Mild Disability (gave pt Spanish version)   COGNITION: Overall cognitive status: Within functional limits for tasks assessed, needs Spanish interpreter     SENSATION: Pt denies numbness/tingling right now, but sometimes when sitting too long will feel numb   POSTURE: rounded shoulders, forward head, and posterior pelvic tilt  PALPATION: TTP L>R lumbar paraspinals Hypomobility and pt reporting incr pain central T12 - L5  LUMBAR ROM:   AROM eval  Flexion WFL, pt able to touch toes  Extension 25% limited, incr pain   Right lateral flexion WFL, no pain  Left lateral flexion 25% limited, incr pain in low back  Right rotation WFL, feels a popping, feels better with this movement  Left rotation WFL,  feels a popping, feels better with this movement   (Blank rows = not tested)  LOWER EXTREMITY ROM:     Active  Right eval Left eval  Hip flexion WFL Approx. 110 degrees, pt reporting incr tightness in low back   Hip extension    Hip abduction    Hip adduction    Hip internal rotation Limited Limited, pt reporting incr tightness   Hip external rotation WFL, pt reporting tightness WFL, pt reporting feeling a good stretch  Knee flexion    Knee extension    Ankle dorsiflexion    Ankle plantarflexion    Ankle inversion    Ankle eversion     (Blank rows = not tested)  LOWER EXTREMITY MMT:    MMT Right eval Left eval  Hip  flexion 5 5  Hip extension  4- 3+  Hip abduction 4+ 4+  Hip adduction 5 5  Hip internal rotation    Hip external rotation    Knee flexion 5 5  Knee extension 5 5  Ankle dorsiflexion 5 5  Ankle plantarflexion    Ankle inversion    Ankle eversion     (Blank rows = not tested)  LUMBAR SPECIAL TESTS:  Straight leg raise test: Negative, pt just reporting hamstring tightness bilat  and Slump test: Negative bilat  GAIT: Distance walked: Clinic distances  Assistive device utilized: None Level of assistance: Complete Independence   TODAY'S TREATMENT:                                                                                                                              N/A during eval   PATIENT EDUCATION:  Education details: Clinical findings, POC, importance of sleep in regards to pain management (pt only able to sleep approx 3-4 hours due to his job) Person educated: Patient Education method: Explanation Education comprehension: verbalized understanding  HOME EXERCISE PROGRAM: Will provide at next session   ASSESSMENT:  CLINICAL IMPRESSION: Patient is a 47 year old male referred to Neuro OPPT for Low Back Pain.   Pt's PMH is significant for: Uncontrolled type 2 diabetes, Diabetic retinopathy, Essential HTN. 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 He used to be able to do a lot of shoveling, but has not been able to do this due to low back pain. The following deficits were present during the exam: postural abnormalities, decr strength, hypomobility, TTP to lumbar paraspinals, hypomobility to lumbar spine, decr ROM, low back pain.  Pt scoring a 10/50 on the ODI indicating mild disability in regards to low back pain. Pt would benefit from skilled PT to address these impairments and functional limitations to maximize functional mobility independence and decr pain.    OBJECTIVE IMPAIRMENTS: decreased activity tolerance, decreased  mobility, decreased ROM, decreased strength, hypomobility, increased fascial restrictions, increased muscle spasms, impaired flexibility, postural dysfunction, and pain.   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   REHAB POTENTIAL: Good  CLINICAL DECISION MAKING: Stable/uncomplicated  EVALUATION COMPLEXITY: Low   GOALS: Goals reviewed with patient? Yes  SHORT TERM GOALS: ALL STGS = LTGS   LONG TERM GOALS: Target date: 01/29/2023  Pt will be independent with final HEP for ROM/strengthening in order to build upon functional gains made in therapy. Baseline:  Goal status: INITIAL  2.  Pt will decr ODI to a 4 or less in order to demo decr disability with low back pain  Baseline: 10/50 = mild disability  Goal status: INITIAL  3.  Pt will report low back pain as a 2/10 or less in order to demo decr pain with functional activities.  Baseline: 4-5/10 Goal status: INITIAL  4.  Pt will demonstrate proper body mechanics for work tasks on the farm.  Baseline:  Goal status: INITIAL  5.  Pt will subjectively report being able to stand for longer amounts of time without low back in order to demo improved activity tolerance for work.  Baseline:  Goal status: INITIAL   PLAN:  PT FREQUENCY: 2x/week  PT DURATION: 4 weeks  PLANNED INTERVENTIONS: Therapeutic exercises, Therapeutic activity, Neuromuscular re-education, Balance training, Gait training, Patient/Family education, Self Care, Joint mobilization, Dry Needling, Spinal mobilization, Moist heat, Manual therapy, and Re-evaluation.  PLAN FOR NEXT SESSION: Initial HEP - hip extension and  ABD strength, stretching to hips/hamstring, and single knee to chest stretch. Spinal mobility. Pt reporting twisting felt good. 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

## 2023-01-03 ENCOUNTER — Ambulatory Visit: Payer: Self-pay | Admitting: Physical Therapy

## 2023-01-03 ENCOUNTER — Encounter: Payer: Self-pay | Admitting: Physical Therapy

## 2023-01-03 VITALS — BP 160/97 | HR 70

## 2023-01-03 DIAGNOSIS — M5459 Other low back pain: Secondary | ICD-10-CM

## 2023-01-03 DIAGNOSIS — R293 Abnormal posture: Secondary | ICD-10-CM

## 2023-01-03 DIAGNOSIS — M6281 Muscle weakness (generalized): Secondary | ICD-10-CM

## 2023-01-03 NOTE — Therapy (Signed)
OUTPATIENT PHYSICAL THERAPY THORACOLUMBAR TREATMENT   Patient Name: Corey Graham MRN: [id number] DOB:1976-10-28, 47 y.o., male Today's Date: 01/03/2023  END OF SESSION:  PT End of Session - 01/03/23 1021     Visit Number 2    Number of Visits 9    Date for PT Re-Evaluation 01/31/23    Authorization Type CAFA - Covered 100%    PT Start Time 1020    PT Stop Time 1101    PT Time Calculation (min) 41 min    Activity Tolerance Patient tolerated treatment well    Behavior During Therapy [facility] for tasks assessed/performed             Past Medical History:  Diagnosis Date   Diabetes mellitus without complication ([place]) 2778   Essential hypertension 09/09/2017   Low back pain 06/24/2017   Past Surgical History:  Procedure Laterality Date   HERNIA REPAIR     Patient Active Problem List   Diagnosis Date Noted   Moderate persistent asthma with acute exacerbation 02/12/2022   Yeast dermatitis of penis 08/30/2021   Uncircumcised male 08/30/2021   Branch retinal vein occlusion of right eye with macular edema 06/11/2019   Nuclear sclerotic cataract of both eyes 06/11/2019   Retinal edema 06/11/2019   Severe nonproliferative diabetic retinopathy of both eyes with macular edema associated with type 2 diabetes mellitus ([place]) 06/11/2019   Essential hypertension 09/09/2017   Low back pain 06/24/2017   Uncontrolled type 2 diabetes mellitus with hyperglycemia, without long-term current use of insulin ([place]) 12/25/2016    PCP:  Kerin Perna, NP  REFERRING PROVIDER: Persons, Bevely Palmer, PA  REFERRING DIAG: M54.50 (ICD-10-CM) - Lumbar pain   Rationale for Evaluation and Treatment: Rehabilitation  THERAPY DIAG:  Other low back pain  Muscle weakness (generalized)  Abnormal posture  ONSET DATE: 12/25/2022  SUBJECTIVE:                                                                                                                                                                                            SUBJECTIVE STATEMENT: Patient reports that his pain is about 4-5/10. He has been taking his medication which is helping his pain. He reports a warm bath can also help. He reports that he is doing well since intial eval. States he did not give BP medication prior to session; verbalized understanding of importance and after education on BP safety, verbalized understanding of safety measures.  Present with in-person interpreter Mickel Baas  PERTINENT HISTORY:  PMH: Uncontrolled type 2 diabetes, Diabetic retinopathy, Essential HTN, low back pain   Per orthopedic doctor: Given his symptoms of  fatigue in his leg question whether he has a stenosis. He is neurologically intact today. His pain is accentuated with extension of his back.   PAIN:  Are you having pain? Yes: NPRS scale: 4-5/10 Pain location: Low back Pain description: "Just a pain" Aggravating factors: Standing on his feet for too long  Relieving factors: Massage  PRECAUTIONS: None  WEIGHT BEARING RESTRICTIONS: No  FALLS:  Has patient fallen in last 6 months? No   OCCUPATION: Works full time at a farm  PLOF: Independent  PATIENT GOALS: Wants to feel better, get rid of the pain   NEXT MD VISIT:   OBJECTIVE:   DIAGNOSTIC FINDINGS:  MRI lumbar spine: 12/31/22  IMPRESSION: 1. Small central disc protrusion with facet hypertrophy at L3-4 with resultant mild canal and bilateral subarticular stenosis, with mild left L3 foraminal narrowing. 2. Disc bulge with facet hypertrophy at L4-5 with resultant mild to moderate bilateral subarticular stenosis, with mild bilateral L4 foraminal narrowing. 3. Small central disc protrusion at L5-S1 without significant stenosis or impingement. 4. Reactive marrow edema about the L4-5 facets bilaterally due to facet arthritis. Finding could serve as a source for lower back pain. 5. Severe left renal atrophy. No visible paraspinous or soft  tissue mass.   TODAY'S TREATMENT:                                                                                                                               Vitals:   01/03/23 1026 01/03/23 1028 01/03/23 1039 01/03/23 1056  BP: (!) 167/99 (!) 155/97 (!) 145/89 (!) 160/97  Pulse: 75 76 70    Patient asymptomatic.   Emphasis on creation of initial HEP. Exercises as noted below.   There Ex: - Seated Hamstring Stretch  - 3 sets - 30 hold - Supine Lower Trunk Rotation  - 3 sets - 10 reps - Supine Posterior Pelvic Tilt  - 3 sets - 10" hold  - very limited movement will require reinforcement - Supine Figure 4 Piriformis Stretch  - 3 sets - 30" hold  - R > L stretch - Supine Bridge - 3 sets - 10 reps  Therapeutic Activity - Log roll technique for getting out of bed (visual demonstration and then patient completion x 1 with SBA - completed teach back method to ensure carryover for home) - BP safety - encouraged regular checks, follow up with PCP, or go to ED  if becomes symptomatic  PATIENT EDUCATION:  Education details: Clinical findings, POC, importance of sleep in regards to pain management (pt only able to sleep approx 3-4 hours due to his job) Person educated: Patient Education method: Explanation Education comprehension: verbalized understanding  HOME EXERCISE PROGRAM: Access Code: [email or web address] URL: [email or web address] Date: 01/03/2023 Prepared by: Malachi Carl  Exercises - Seated Hamstring Stretch  - 1 x daily - 7 x weekly - 3 sets - 30 hold - Supine Lower Trunk Rotation  - 1 x daily - 7  x weekly - 3 sets - 10 reps - Supine Posterior Pelvic Tilt  - 1 x daily - 7 x weekly - 3 sets - 10" hold - Supine Figure 4 Piriformis Stretch  - 1 x daily - 7 x weekly - 3 sets - 30" hold - Supine Bridge  - 1 x daily - 7 x weekly - 3 sets - 10 reps  ASSESSMENT:  CLINICAL IMPRESSION: Patient arrives to session with elevated BP; session modified accordingly with strong  emphasis on BP safety and follow up with PCP. Patient verbalized understanding. Initiated HEP which patient tolerated well and reported reduce pain which in turn reduced BP. Patient reported pain when he sat up so therapist educated on use of log roll technique to manage pain moving forward. Patient completed and reported no pain with attempt. BP had returned to higher levels of elevation at end of session and so therapist again educated on BP and when to go to ED if becomes symptomatic. Pt would benefit from skilled PT to address these impairments and functional limitations to maximize functional mobility independence and decr pain.    OBJECTIVE IMPAIRMENTS: decreased activity tolerance, decreased mobility, decreased ROM, decreased strength, hypomobility, increased fascial restrictions, increased muscle spasms, impaired flexibility, postural dysfunction, and pain.   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   REHAB POTENTIAL: Good  CLINICAL DECISION MAKING: Stable/uncomplicated  EVALUATION COMPLEXITY: Low   GOALS: Goals reviewed with patient? Yes  SHORT TERM GOALS: ALL STGS = LTGS   LONG TERM GOALS: Target date: 01/29/2023  Pt will be independent with final HEP for ROM/strengthening in order to build upon functional gains made in therapy. Baseline:  Goal status: INITIAL  2.  Pt will decr ODI to a 4 or less in order to demo decr disability with low back pain  Baseline: 10/50 = mild disability  Goal status: INITIAL  3.  Pt will report low back pain as a 2/10 or less in order to demo decr pain with functional activities.  Baseline:  4-5/10 Goal status: INITIAL  4.  Pt will demonstrate proper body mechanics for work tasks on the farm.  Baseline:  Goal status: INITIAL  5.  Pt will subjectively report being able to stand for longer amounts of time without low back in order to demo improved activity tolerance for work.  Baseline:  Goal status: INITIAL   PLAN:  PT FREQUENCY: 2x/week  PT DURATION: 4 weeks  PLANNED INTERVENTIONS: Therapeutic exercises, Therapeutic activity, Neuromuscular re-education, Balance training, Gait training, Patient/Family education, Self Care, Joint mobilization, Dry Needling, Spinal mobilization, Moist heat, Manual therapy, and Re-evaluation.  PLAN FOR NEXT SESSION: Initial HEP - hip extension and ABD strength, stretching to hips/hamstring, and single knee to chest stretch. Spinal mobility. Pt reporting twisting felt good. 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

## 2023-01-07 ENCOUNTER — Encounter (INDEPENDENT_AMBULATORY_CARE_PROVIDER_SITE_OTHER): Payer: Self-pay | Admitting: Primary Care

## 2023-01-07 ENCOUNTER — Other Ambulatory Visit: Payer: Self-pay

## 2023-01-07 ENCOUNTER — Ambulatory Visit (INDEPENDENT_AMBULATORY_CARE_PROVIDER_SITE_OTHER): Payer: Self-pay | Admitting: Primary Care

## 2023-01-07 VITALS — BP 163/94 | HR 85 | Ht 64.5 in | Wt 167.8 lb

## 2023-01-07 DIAGNOSIS — N261 Atrophy of kidney (terminal): Secondary | ICD-10-CM

## 2023-01-07 DIAGNOSIS — M545 Low back pain, unspecified: Secondary | ICD-10-CM

## 2023-01-07 DIAGNOSIS — E1165 Type 2 diabetes mellitus with hyperglycemia: Secondary | ICD-10-CM

## 2023-01-07 DIAGNOSIS — G8929 Other chronic pain: Secondary | ICD-10-CM

## 2023-01-07 DIAGNOSIS — Z23 Encounter for immunization: Secondary | ICD-10-CM

## 2023-01-07 DIAGNOSIS — I10 Essential (primary) hypertension: Secondary | ICD-10-CM

## 2023-01-07 MED ORDER — ACCU-CHEK SOFTCLIX LANCETS MISC
12 refills | Status: DC
  Filled 2023-01-07: qty 100, 33d supply, fill #0

## 2023-01-07 MED ORDER — ACCU-CHEK GUIDE VI STRP
ORAL_STRIP | 12 refills | Status: DC
  Filled 2023-01-07: qty 100, 33d supply, fill #0
  Filled 2023-04-09: qty 100, 25d supply, fill #0

## 2023-01-07 NOTE — Progress Notes (Signed)
[facility]  Subjective: CC: back pain PCP: Corey Perna, NP HPI: Patient is a 47 y.o. Hispanic male Corey Graham [id number] ) presenting to clinic today for back pain. Concerns today include:  1. Back Pain Patient reports that pain began 3 months ago .  He has h/o back pain.  Pain is a 6/10.  It does not  radiate.  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  Rest improves pain.  Patient has been taking Tylenol  for pain with little relief.  Patient denies trauma or injury.  Denies dysuria, hematuria, fevers, chills, nausea, vomiting, abdominal pain, renal stones. Aggravating factors: certain movements and prolonged walking/standing. Alleviating factors: rest. Progressive LE weakness or saddle anesthesia: none. Extremity sensation changes or weakness: none. Ambulatory without difficulty. Normal bowel/bladder habits: yes; without urinary retention. Normal PO intake without n/v. No associated abdominal pain/n/v. Self treatment: has OTC analgesics, with minimal relief. Patient denies: urinary retention/incontinence, bowel incontinence, weakness, falls, sensation changes or pain anywhere else. No h/o back surgeries.  2. Hypertension  Patient has No headache, No chest pain, No abdominal pain - No Nausea, No new weakness tingling or numbness, No Cough - shortness of breath  BP goal - < 130/80 Explained that having normal blood pressure is the goal and medications are helping to get to goal and maintain normal blood pressure. DIET: Limit salt intake, read nutrition labels to check salt content, limit fried and high fatty foods  Avoid using multisymptom OTC cold preparations that generally contain sudafed which can rise BP. Consult with pharmacist on best cold relief products to use for persons with HTN EXERCISE Discussed incorporating exercise such as walking - 30 minutes most days of the week and can do in 10 minute intervals      Current Outpatient  Medications:    albuterol (VENTOLIN HFA) 108 (90 Base) MCG/ACT inhaler, Inhale 2 puffs into the lungs every 6 (six) hours as needed for wheezing or shortness of breath., Disp: 6.7 g, Rfl: 1   amLODipine (NORVASC) 10 MG tablet, Take 1 tablet (10 mg total) by mouth daily., Disp: 90 tablet, Rfl: 3   amoxicillin-clavulanate (AUGMENTIN) 875-125 MG tablet, Take 1 tablet by mouth 2 (two) times daily., Disp: 20 tablet, Rfl: 0   Blood Glucose Monitoring Suppl (ONETOUCH VERIO) w/Device KIT, Please use to check blood sugar once daily., Disp: 1 kit, Rfl: 0   Dulaglutide (TRULICITY) 3 QB/3.4LP SOPN, Inject 3 mg as directed once a week., Disp: 2 mL, Rfl: 3   fluticasone (FLOVENT HFA) 110 MCG/ACT inhaler, Inhale 2 puffs into the lungs 2 (two) times daily., Disp: 12 g, Rfl: 12   glipiZIDE (GLUCOTROL) 10 MG tablet, Take 1 tablet (10 mg total) by mouth 2 (two) times daily with a meal., Disp: 180 tablet, Rfl: 1   glucose blood (ONETOUCH VERIO) test strip, Please use to check blood sugar once daily., Disp: 100 each, Rfl: 3   hydrochlorothiazide (HYDRODIURIL) 25 MG tablet, Take 1 tablet (25 mg total) by mouth daily., Disp: 90 tablet, Rfl: 3   losartan (COZAAR) 25 MG tablet, Take 1 tablet (25 mg total) by mouth daily., Disp: 90 tablet, Rfl: 1   meloxicam (MOBIC) 7.5 MG tablet, Take 1 tablet (7.5 mg total) by mouth daily., Disp: 30 tablet, Rfl: 0   metFORMIN (GLUCOPHAGE) 1000 MG tablet, Take 1 tablet (1,000 mg total) by mouth 2 (two) times daily with a meal., Disp: 180 tablet, Rfl: 1   OneTouch Delica  Lancets 33G MISC, Please use to check blood sugar once daily., Disp: 100 each, Rfl: 3   pantoprazole (PROTONIX) 40 MG tablet, Take 1 tablet (40 mg total) by mouth daily., Disp: 30 tablet, Rfl: 3   senna (SENOKOT) 8.6 MG tablet, Take 1 tablet (8.6 mg total) by mouth daily., Disp: 90 tablet, Rfl: 1   fenofibrate (TRICOR) 145 MG tablet, TAKE 1 TABLET (145 MG TOTAL) BY MOUTH DAILY., Disp: 90 tablet, Rfl: 2 No Known  Allergies  Past Medical History:  Diagnosis Date   Diabetes mellitus without complication (HCC) 2012   Essential hypertension 09/09/2017   Low back pain 06/24/2017   Social History   Socioeconomic History   Marital status: Married    Spouse name: Not on file   Number of children: 3   Years of education: Not on file   Highest education level: Not on file  Occupational History   Not on file  Tobacco Use   Smoking status: Never   Smokeless tobacco: Never  Vaping Use   Vaping Use: Never used  Substance and Sexual Activity   Alcohol use: Yes    Comment: infrequent   Drug use: No   Sexual activity: Yes  Other Topics Concern   Not on file  Social History Narrative   LIves at home with wife and 3 children   Social Determinants of Health   Financial Resource Strain: Not on file  Food Insecurity: Not on file  Transportation Needs: Not on file  Physical Activity: Not on file  Stress: Not on file  Social Connections: Not on file  Intimate Partner Violence: Not on file   Past Surgical History:  Procedure Laterality Date   HERNIA REPAIR      ROS: per HPI  Objective: Office vital signs reviewed. Blood Pressure (Abnormal) 163/94 (BP Location: Right Arm)   Pulse 85   Height 5' 4.5" (1.638 m)   Weight 167 lb 12.8 oz (76.1 kg)   Oxygen Saturation 100%   Body Mass Index 28.36 kg/m   Physical Examination:  General: Awake, alert, well nourished, NAD Cardio: Regular rate and rhythm, S1S2 heard, no murmurs appreciated Pulm: Clear to auscultation bilaterally, no wheezes, rhonchi or rales Extremities: Warm, well-perfused. No edema, cyanosis or clubbing; + pulses bilaterally MSK: norma; gait and station  No curvatures  Spine:  AROM, no midline tenderness to palpation, no paraspinal tenderness to palpation.  No palpable bony deformities,  normal straight leg test Neuro: 5/5 lower extremity strength; lower extremity light touch sensation grossly intact  Assessment/  Plan:  [name] was seen today for diabetes.  Diagnoses and all orders for this visit:  Need for immunization against influenza -     Flu Vaccine QUAD 66mo+IM (Fluarix, Fluzone & Alfiuria Quad PF)   Chronic bilateral low back pain without sciatica  Followed by ortho  Vertebrae: Vertebral body height maintained without acute or chronic fracture. Bone marrow signal intensity within normal limits. No worrisome osseous lesions. Reactive marrow edema present about the L4-5 facets bilaterally due to facet arthritis  Ambulatory referral to Urology  Severe left renal atrophy. No visible paraspinous or soft tissue mass.  Essential hypertension  Elevated Bp feels it is due to pain and did not take his medication today. Discussed risk factors with uncontrol HTN- stroke , heart attack or both . Reviewed how to read labels for sodium content. Counseled on blood pressure goal of less than 130/80, low-sodium, DASH diet, medication compliance, 150 minutes of moderate intensity exercise per week.  Discussed medication compliance, adverse effects.     The above assessment and management plan was discussed with the patient. The patient verbalized understanding of and has agreed to the management plan. Patient is aware to call the clinic if symptoms persist or worsen. Patient is aware when to return to the clinic for a follow-up visit. Patient educated on when it is appropriate to go to the emergency department.   This note has been created with Surveyor, quantity. Any transcriptional errors are unintentional.   Corey Perna, NP 01/07/2023, 11:18 AM

## 2023-01-08 ENCOUNTER — Encounter: Payer: Self-pay | Admitting: Physical Therapy

## 2023-01-08 ENCOUNTER — Other Ambulatory Visit: Payer: Self-pay

## 2023-01-08 ENCOUNTER — Ambulatory Visit: Payer: Self-pay | Admitting: Physical Therapy

## 2023-01-08 VITALS — BP 147/99 | HR 83

## 2023-01-08 DIAGNOSIS — R293 Abnormal posture: Secondary | ICD-10-CM

## 2023-01-08 DIAGNOSIS — M5459 Other low back pain: Secondary | ICD-10-CM

## 2023-01-08 DIAGNOSIS — M6281 Muscle weakness (generalized): Secondary | ICD-10-CM

## 2023-01-08 NOTE — Therapy (Signed)
OUTPATIENT PHYSICAL THERAPY THORACOLUMBAR TREATMENT   Patient Name: Corey Graham MRN: [id number] DOB:July 02, 1976, 47 y.o., male Today's Date: 01/08/2023  END OF SESSION:  PT End of Session - 01/08/23 1018     Visit Number 3    Number of Visits 9    Date for PT Re-Evaluation 01/31/23    Authorization Type CAFA - Covered 100%    PT Start Time 1018    PT Stop Time 1100    PT Time Calculation (min) 42 min    Activity Tolerance Patient tolerated treatment well    Behavior During Therapy [facility] for tasks assessed/performed             Past Medical History:  Diagnosis Date   Diabetes mellitus without complication (HCC) 2012   Essential hypertension 09/09/2017   Low back pain 06/24/2017   Past Surgical History:  Procedure Laterality Date   HERNIA REPAIR     Patient Active Problem List   Diagnosis Date Noted   Moderate persistent asthma with acute exacerbation 02/12/2022   Yeast dermatitis of penis 08/30/2021   Uncircumcised male 08/30/2021   Branch retinal vein occlusion of right eye with macular edema 06/11/2019   Nuclear sclerotic cataract of both eyes 06/11/2019   Retinal edema 06/11/2019   Severe nonproliferative diabetic retinopathy of both eyes with macular edema associated with type 2 diabetes mellitus (HCC) 06/11/2019   Essential hypertension 09/09/2017   Low back pain 06/24/2017   Uncontrolled type 2 diabetes mellitus with hyperglycemia, without long-term current use of insulin (HCC) 12/25/2016    PCP:  Grayce Sessions, NP  REFERRING PROVIDER: Persons, [place], PA  REFERRING DIAG: M54.50 (ICD-10-CM) - Lumbar pain   Rationale for Evaluation and Treatment: Rehabilitation  THERAPY DIAG:  Other low back pain  Abnormal posture  Muscle weakness (generalized)  ONSET DATE: 12/25/2022  SUBJECTIVE:                                                                                                                                                                                            SUBJECTIVE STATEMENT: Patient reports to physical therapy that he has a some back pain. Patient had an appointment with PCP yesterday and states that a new medication was added that he will pick up today. Patient reports that he did take his blood pressure today. Patient reports that he was able to do a few exercises over the last week. Patient reports that he thinks the exercises are helping but he has noticed that he has been more tired recently. Patient reports that he has a BP machine at home but is unsure on how  to use it; therapist states that he bring it in next session to assess. Blood glucose was around 180 this morning.  Present with in-person interpreter Hassie Bruce ([place] speaking)  PERTINENT HISTORY:  PMH: Uncontrolled type 2 diabetes, Diabetic retinopathy, Essential HTN, low back pain   Per orthopedic doctor: Given his symptoms of fatigue in his leg question whether he has a stenosis. He is neurologically intact today. His pain is accentuated with extension of his back.   PAIN:  Are you having pain? Yes: NPRS scale: 6/10 Pain location: Low back Pain description: "constant pain" Aggravating factors: Standing on his feet for too long  Relieving factors: Massage  PRECAUTIONS: None  WEIGHT BEARING RESTRICTIONS: No  FALLS:  Has patient fallen in last 6 months? No   OCCUPATION: Works full time at a farm  PLOF: Independent  PATIENT GOALS: Wants to feel better, get rid of the pain   NEXT MD VISIT:   OBJECTIVE:   DIAGNOSTIC FINDINGS:  MRI lumbar spine: 12/31/22  IMPRESSION: 1. Small central disc protrusion with facet hypertrophy at L3-4 with resultant mild canal and bilateral subarticular stenosis, with mild left L3 foraminal narrowing. 2. Disc bulge with facet hypertrophy at L4-5 with resultant mild to moderate bilateral subarticular stenosis, with mild bilateral L4 foraminal narrowing. 3. Small central disc protrusion at L5-S1  without significant stenosis or impingement. 4. Reactive marrow edema about the L4-5 facets bilaterally due to facet arthritis. Finding could serve as a source for lower back pain. 5. Severe left renal atrophy. No visible paraspinous or soft tissue mass.   TODAY'S TREATMENT:                                                                                                                               Vitals:   01/08/23 1025 01/08/23 1058  BP: (!) 123/94 (!) 147/99  Pulse: (!) 101 83    Patient asymptomatic.; taken at start and end of session.  There Ex: - Seated Hamstring Stretch  - 3 sets - 30 hold - Supine Figure 4 Piriformis Stretch  - 3 sets - 30" hold - Supine Lower Trunk Rotation  - 2.5 minutes   - reports pain on R hip (improved with reduced ROM as cued by therapist) - Supine Posterior Pelvic Tilt  - 3 sets - 10" hold - Supine Marching with PPT 2 x 10 - Single knee to chest 2 x 30" - Supine Bridge - 2 sets - 10 reps   PATIENT EDUCATION:  Education details: Continue HEP, BP safety  Person educated: Patient Education method: Explanation Education comprehension: verbalized understanding  HOME EXERCISE PROGRAM: Access Code: [email or web address] URL: [email or web address] Date: 01/08/2023 Prepared by: Malachi Carl  Exercises - Seated Hamstring Stretch  - 1 x daily - 7 x weekly - 3 sets - 30 hold - Supine Lower Trunk Rotation  - 1 x daily - 7 x weekly - 3 sets - 10 reps - Supine Posterior Pelvic Tilt  -  1 x daily - 7 x weekly - 3 sets - 10" hold - Supine Figure 4 Piriformis Stretch  - 1 x daily - 7 x weekly - 3 sets - 30" hold - Supine Bridge  - 1 x daily - 7 x weekly - 3 sets - 10 reps - Supine March  - 1 x daily - 7 x weekly - 3 sets - 10 reps - Supine Single Knee to Chest Stretch  - 1 x daily - 7 x weekly - 3 sets - 30 hold  ASSESSMENT:  CLINICAL IMPRESSION: Session emphasized review and progression of HEP. Patient tolerated well though BP continues to be on  cusp of being outside of safe therapeutic range. Recommend continued monitoring and to follow PCP instructions. Will plan to assess functional tasks next session. Pt would benefit from skilled PT to address these impairments and functional limitations to maximize functional mobility independence and decr pain.    OBJECTIVE IMPAIRMENTS: decreased activity tolerance, decreased mobility, decreased ROM, decreased strength, hypomobility, increased fascial restrictions, increased muscle spasms, impaired flexibility, postural dysfunction, and pain.   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   REHAB POTENTIAL: Good  CLINICAL DECISION MAKING: Stable/uncomplicated  EVALUATION COMPLEXITY: Low   GOALS: Goals reviewed with patient? Yes  SHORT TERM GOALS: ALL STGS = LTGS   LONG TERM GOALS: Target date: 01/29/2023  Pt will be independent with final HEP for ROM/strengthening in order to build upon functional gains made in therapy. Baseline:  Goal status: INITIAL  2.  Pt will decr ODI to a 4 or less in order to demo decr disability with low back pain  Baseline: 10/50 = mild disability  Goal status: INITIAL  3.  Pt will report low back pain as a 2/10 or less in order to demo decr pain with functional activities.  Baseline: 4-5/10 Goal status: INITIAL  4.  Pt will demonstrate proper body mechanics for work tasks on the farm.  Baseline:  Goal status: INITIAL  5.  Pt will subjectively report being able to stand for longer amounts of time without low back in order to demo improved activity tolerance for work.  Baseline:  Goal status:  INITIAL   PLAN:  PT FREQUENCY: 2x/week  PT DURATION: 4 weeks  PLANNED INTERVENTIONS: Therapeutic exercises, Therapeutic activity, Neuromuscular re-education, Balance training, Gait training, Patient/Family education, Self Care, Joint mobilization, Dry Needling, Spinal mobilization, Moist heat, Manual therapy, and Re-evaluation.  PLAN FOR NEXT SESSION: Continue HEP- hip extension and ABD strength, stretching to hips/hamstring, and single knee to chest stretch. Spinal mobility. Pt reporting twisting felt good. 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

## 2023-01-09 ENCOUNTER — Other Ambulatory Visit: Payer: Self-pay

## 2023-01-10 ENCOUNTER — Other Ambulatory Visit: Payer: Self-pay

## 2023-01-10 ENCOUNTER — Encounter: Payer: Self-pay | Admitting: Physical Therapy

## 2023-01-10 ENCOUNTER — Other Ambulatory Visit (INDEPENDENT_AMBULATORY_CARE_PROVIDER_SITE_OTHER): Payer: Self-pay | Admitting: Primary Care

## 2023-01-10 ENCOUNTER — Ambulatory Visit: Payer: Self-pay | Admitting: Physical Therapy

## 2023-01-10 ENCOUNTER — Telehealth (INDEPENDENT_AMBULATORY_CARE_PROVIDER_SITE_OTHER): Payer: Self-pay | Admitting: Primary Care

## 2023-01-10 VITALS — BP 140/102 | HR 98

## 2023-01-10 DIAGNOSIS — M6281 Muscle weakness (generalized): Secondary | ICD-10-CM

## 2023-01-10 DIAGNOSIS — M5459 Other low back pain: Secondary | ICD-10-CM

## 2023-01-10 DIAGNOSIS — R293 Abnormal posture: Secondary | ICD-10-CM

## 2023-01-10 NOTE — Telephone Encounter (Signed)
Pt called reporting that he is missing blood pressure medication, he does not know the name of his medication. But says he was supposed to have refills following his appt with his PCP this week.   [place] [employer], [place] [address]  Phone: [phone] Fax: [phone]

## 2023-01-10 NOTE — Telephone Encounter (Signed)
Will forward to provider  

## 2023-01-10 NOTE — Telephone Encounter (Signed)
Patient has refills on all Bp meds - he needs to be aware of the medication he takes

## 2023-01-10 NOTE — Therapy (Signed)
OUTPATIENT PHYSICAL THERAPY THORACOLUMBAR TREATMENT   Patient Name: Corey Graham MRN: [id number] DOB:[birth year], 47 y.o., male Today's Date: 01/10/2023  END OF SESSION:  PT End of Session - 01/10/23 1019     Visit Number 3    Number of Visits 9    Date for PT Re-Evaluation 01/31/23    Authorization Type CAFA - Covered 100%    PT Start Time 1019    PT Stop Time 1030    PT Time Calculation (min) 11 min    Activity Tolerance --   unable to continue treatment BP out of safe therapuetic range   Behavior During Therapy [facility] for tasks assessed/performed             Past Medical History:  Diagnosis Date   Diabetes mellitus without complication ([place]) 9476   Essential hypertension 09/09/2017   Low back pain 06/24/2017   Past Surgical History:  Procedure Laterality Date   HERNIA REPAIR     Patient Active Problem List   Diagnosis Date Noted   Moderate persistent asthma with acute exacerbation 02/12/2022   Yeast dermatitis of penis 08/30/2021   Uncircumcised male 08/30/2021   Branch retinal vein occlusion of right eye with macular edema 06/11/2019   Nuclear sclerotic cataract of both eyes 06/11/2019   Retinal edema 06/11/2019   Severe nonproliferative diabetic retinopathy of both eyes with macular edema associated with type 2 diabetes mellitus ([place]) 06/11/2019   Essential hypertension 09/09/2017   Low back pain 06/24/2017   Uncontrolled type 2 diabetes mellitus with hyperglycemia, without long-term current use of insulin ([place]) 12/25/2016    PCP:  Kerin Perna, NP  REFERRING PROVIDER: Persons, Bev[place] Palmer, PA  REFERRING DIAG: M54.50 (ICD-10-CM) - Lumbar pain   Rationale for Evaluation and Treatment: Rehabilitation  THERAPY DIAG:  Other low back pain  Abnormal posture  Muscle weakness (generalized)  ONSET DATE: 12/25/2022  SUBJECTIVE:                                                                                                                                                                                            SUBJECTIVE STATEMENT: Patient arrives to session but unable to continue as BP out of safe therapeutic range. Patient arrives to clinic with personal BP monitor given by PCP but states he does not know how to use it. Patient reports going to pharmacy but they did not have his medications.   Present with in-person interpreter [name].  PERTINENT HISTORY:  PMH: Uncontrolled type 2 diabetes, Diabetic retinopathy, Essential HTN, low back pain   Per orthopedic doctor: Given his symptoms of fatigue in his leg question  whether he has a stenosis. He is neurologically intact today. His pain is accentuated with extension of his back.   PAIN:  Are you having pain? Yes: NPRS scale: 6/10 Pain location: Low back Pain description: "constant pain" Aggravating factors: Standing on his feet for too long  Relieving factors: Massage  PRECAUTIONS: None  WEIGHT BEARING RESTRICTIONS: No  FALLS:  Has patient fallen in last 6 months? No   OCCUPATION: Works full time at a farm  PLOF: Independent  PATIENT GOALS: Wants to feel better, get rid of the pain   NEXT MD VISIT:   OBJECTIVE:   DIAGNOSTIC FINDINGS:  MRI lumbar spine: 12/31/22  IMPRESSION: 1. Small central disc protrusion with facet hypertrophy at L3-4 with resultant mild canal and bilateral subarticular stenosis, with mild left L3 foraminal narrowing. 2. Disc bulge with facet hypertrophy at L4-5 with resultant mild to moderate bilateral subarticular stenosis, with mild bilateral L4 foraminal narrowing. 3. Small central disc protrusion at L5-S1 without significant stenosis or impingement. 4. Reactive marrow edema about the L4-5 facets bilaterally due to facet arthritis. Finding could serve as a source for lower back pain. 5. Severe left renal atrophy. No visible paraspinous or soft tissue mass.   TODAY'S TREATMENT:                                                                                                                                Vitals:   01/10/23 1020 01/10/23 1030  BP: (!) 145/101 (!) 140/102  Pulse: 95 98  Patient is asymptomatic. Out of safe therapuetic range; treatment today held.  PATIENT EDUCATION:  Education details: BP safety; see below Person educated: Patient Education method: Explanation Education comprehension: verbalized understanding  HOME EXERCISE PROGRAM: Access Code: [email or web address] URL: [email or web address] Date: 01/08/2023 Prepared by: Malachi Carl  Exercises - Seated Hamstring Stretch  - 1 x daily - 7 x weekly - 3 sets - 30 hold - Supine Lower Trunk Rotation  - 1 x daily - 7 x weekly - 3 sets - 10 reps - Supine Posterior Pelvic Tilt  - 1 x daily - 7 x weekly - 3 sets - 10" hold - Supine Figure 4 Piriformis Stretch  - 1 x daily - 7 x weekly - 3 sets - 30" hold - Supine Bridge  - 1 x daily - 7 x weekly - 3 sets - 10 reps - Supine March  - 1 x daily - 7 x weekly - 3 sets - 10 reps - Supine Single Knee to Chest Stretch  - 1 x daily - 7 x weekly - 3 sets - 30 hold  ASSESSMENT:  CLINICAL IMPRESSION: Session was arrive no charge as patient's BP out of safe therapeutic range. Assessed BP at start of session; out of safe therapeutic range as diastolic > 147. Educated patient on use of BP cuff; advised rechecking and recording x 2 a day and to call PCP when gets home about follow up.  Educated on signs of stroke and to get driven to ED if becomes symptomatic. Patient verbalizes understanding and demonstrates proper use of BP cuff and safe versus unsafe readings. Continue POC as able.   OBJECTIVE IMPAIRMENTS: decreased activity tolerance, decreased mobility, decreased ROM, decreased strength, hypomobility, increased fascial restrictions, increased muscle spasms, impaired flexibility, postural dysfunction, and pain.   ACTIVITY LIMITATIONS: carrying, lifting, bending, standing, and squatting  PARTICIPATION LIMITATIONS:  occupation and works on a farm   PERSONAL FACTORS: Past/current experiences, Profession, Time since onset of injury/illness/exacerbation, and 1-2 comorbidities: Uncontrolled type 2 diabetes, Diabetic retinopathy, Essential HTN, low back pain   are also affecting patient's functional outcome.   REHAB POTENTIAL: Good  CLINICAL DECISION MAKING: Stable/uncomplicated  EVALUATION COMPLEXITY: Low   GOALS: Goals reviewed with patient? Yes  SHORT TERM GOALS: ALL STGS = LTGS   LONG TERM GOALS: Target date: 01/29/2023  Pt will be independent with final HEP for ROM/strengthening in order to build upon functional gains made in therapy. Baseline:  Goal status: INITIAL  2.  Pt will decr ODI to a 4 or less in order to demo decr disability with low back pain  Baseline: 10/50 = mild disability  Goal status: INITIAL  3.  Pt will report low back pain as a 2/10 or less in order to demo decr pain with functional activities.  Baseline: 4-5/10 Goal status: INITIAL  4.  Pt will demonstrate proper body mechanics for work tasks on the farm.  Baseline:  Goal status: INITIAL  5.  Pt will subjectively report being able to stand for longer amounts of time without low back in order to demo improved activity tolerance for work.  Baseline:  Goal status: INITIAL   PLAN:  PT FREQUENCY: 2x/week  PT DURATION: 4 weeks  PLANNED INTERVENTIONS: Therapeutic exercises, Therapeutic activity, Neuromuscular re-education, Balance training, Gait training, Patient/Family education, Self Care, Joint mobilization, Dry Needling, Spinal mobilization, Moist heat, Manual therapy, and Re-evaluation.  PLAN FOR NEXT SESSION: Continue HEP- hip extension and ABD strength, stretching to hips/hamstring, and single knee to chest stretch. Spinal mobility. Pt reporting twisting felt good. 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

## 2023-01-15 ENCOUNTER — Other Ambulatory Visit: Payer: Self-pay

## 2023-01-16 ENCOUNTER — Encounter: Payer: Self-pay | Admitting: Physical Therapy

## 2023-01-16 ENCOUNTER — Ambulatory Visit: Payer: Self-pay | Admitting: Physical Therapy

## 2023-01-16 VITALS — BP 140/95 | HR 81

## 2023-01-16 DIAGNOSIS — M6281 Muscle weakness (generalized): Secondary | ICD-10-CM

## 2023-01-16 DIAGNOSIS — R293 Abnormal posture: Secondary | ICD-10-CM

## 2023-01-16 DIAGNOSIS — M5459 Other low back pain: Secondary | ICD-10-CM

## 2023-01-16 NOTE — Therapy (Signed)
OUTPATIENT PHYSICAL THERAPY THORACOLUMBAR TREATMENT   Patient Name: Corey Graham MRN: [id number] DOB:Jun 21, 1976, 47 y.o., male Today's Date: 01/16/2023  END OF SESSION:  PT End of Session - 01/16/23 1012     Visit Number 4    Number of Visits 9    Date for PT Re-Evaluation 01/31/23    Authorization Type CAFA - Covered 100%    PT Start Time 1011    PT Stop Time 4098    PT Time Calculation (min) 41 min    Activity Tolerance Patient tolerated treatment well   unable to continue treatment BP out of safe therapuetic range   Behavior During Therapy [facility] for tasks assessed/performed             Past Medical History:  Diagnosis Date   Diabetes mellitus without complication ([place]) 1191   Essential hypertension 09/09/2017   Low back pain 06/24/2017   Past Surgical History:  Procedure Laterality Date   HERNIA REPAIR     Patient Active Problem List   Diagnosis Date Noted   Moderate persistent asthma with acute exacerbation 02/12/2022   Yeast dermatitis of penis 08/30/2021   Uncircumcised male 08/30/2021   Branch retinal vein occlusion of right eye with macular edema 06/11/2019   Nuclear sclerotic cataract of both eyes 06/11/2019   Retinal edema 06/11/2019   Severe nonproliferative diabetic retinopathy of both eyes with macular edema associated with type 2 diabetes mellitus ([place]) 06/11/2019   Essential hypertension 09/09/2017   Low back pain 06/24/2017   Uncontrolled type 2 diabetes mellitus with hyperglycemia, without long-term current use of insulin ([place]) 12/25/2016    PCP:  Kerin Perna, NP  REFERRING PROVIDER: Persons, Bevely Palmer, PA  REFERRING DIAG: M54.50 (ICD-10-CM) - Lumbar pain   Rationale for Evaluation and Treatment: Rehabilitation  THERAPY DIAG:  Other low back pain  Abnormal posture  Muscle weakness (generalized)  ONSET DATE: 12/25/2022  SUBJECTIVE:                                                                                                                                                                                            SUBJECTIVE STATEMENT: Patient has been monitoring his BP at home and diastolic has been in the low to mid 90s. Did not get a chance to check his BP last night as he was too tired. Needs to talk to someone at the pharmacy regarding his BP meds. Has an appt next Tuesday with the pharmacist. Reports back was hurting a lot yesterday. 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 Has  to sit bent over so the pain won't be as much  Present with in-person interpreter Alexander   PERTINENT HISTORY:  PMH: Uncontrolled type 2 diabetes, Diabetic retinopathy, Essential HTN, low back pain   Per orthopedic doctor: Given his symptoms of fatigue in his leg question whether he has a stenosis. He is neurologically intact today. His pain is accentuated with extension of his back.   PAIN:  Are you having pain? Yes: NPRS scale: 6/10 Pain location: Low back Pain description: "constant pain" Aggravating factors: Standing on his feet for too long  Relieving factors: Massage  Vitals:   01/16/23 1018  BP: (!) 140/95  Pulse: 81     PRECAUTIONS: None  WEIGHT BEARING RESTRICTIONS: No  FALLS:  Has patient fallen in last 6 months? No   OCCUPATION: Works full time at a farm  PLOF: Independent  PATIENT GOALS: Wants to feel better, get rid of the pain    OBJECTIVE:   DIAGNOSTIC FINDINGS:  MRI lumbar spine: 12/31/22  IMPRESSION: 1. Small central disc protrusion with facet hypertrophy at L3-4 with resultant mild canal and bilateral subarticular stenosis, with mild left L3 foraminal narrowing. 2. Disc bulge with facet hypertrophy at L4-5 with resultant mild to moderate bilateral subarticular stenosis, with mild bilateral L4 foraminal narrowing. 3. Small central disc protrusion at L5-S1 without significant stenosis or impingement. 4. Reactive marrow edema  about the L4-5 facets bilaterally due to facet arthritis. Finding could serve as a source for lower back pain. 5. Severe left renal atrophy. No visible paraspinous or soft tissue mass.   TODAY'S TREATMENT:                                                                                                                       Therapeutic Exercise: Standing at countertop: Weight shifting A/P for hamstring and low back stretch and then bringing belly button to the countertop x10 reps Trunk rotations reaching underneath body x6 reps each side, pt reporting feeling relief from these Quadruped cat/cow 10 reps Child's pose 2 x 30 seconds within pt's available range Supine posterior pelvic tilts, needing max verbal/demo/tactile cues x5 reps. Then tried A/P pelvic tilts with max cues for technique, x10 reps, pt with limited AROM and still difficulty doing. Pt reporting incr pain.  Bridging x5 reps with cues for glute activation, full ROM, performed an additional 10 reps with squeezing ball for hip ADD Lower trunk rotations with hip ADD ball squeeze x5 reps each side, cues for gentle ROM    Therapeutic Activity: Discussed BP values and importance of following up with his PCP regarding these and importance of keeping BP under control. Pt to continue to monitor BP and reports that he has an appt with his phamacist next week.  Educated on Economist at work as pt does a lot of physical labor. Educated on proper squat position and using legs vs. Just bending from his back (pt reports he is mainly just bending from his back). Pt able to  demo understanding and will try to do this at work. Discussed sitting on a stool if he can when doing things with bending instead of a prolonged bending position. Educated to turn full body if doing turning tasks vs. Just pt's trunk and upper body.   PATIENT EDUCATION:  Education details: BP safety; continue with HEP, Body mechanics for work tasks.  Person educated:  Patient Education method: Explanation, Demonstration, and Verbal cues Education comprehension: verbalized understanding, returned demonstration, and needs further education  HOME EXERCISE PROGRAM: Access Code: [email or web address] URL: [email or web address] Date: 01/08/2023 Prepared by: Malachi Carl  Exercises - Seated Hamstring Stretch  - 1 x daily - 7 x weekly - 3 sets - 30 hold - Supine Lower Trunk Rotation  - 1 x daily - 7 x weekly - 3 sets - 10 reps - Supine Posterior Pelvic Tilt  - 1 x daily - 7 x weekly - 3 sets - 10" hold - Supine Figure 4 Piriformis Stretch  - 1 x daily - 7 x weekly - 3 sets - 30" hold - Supine Bridge  - 1 x daily - 7 x weekly - 3 sets - 10 reps - Supine March  - 1 x daily - 7 x weekly - 3 sets - 10 reps - Supine Single Knee to Chest Stretch  - 1 x daily - 7 x weekly - 3 sets - 30 hold  ASSESSMENT:  CLINICAL IMPRESSION: Pt's BP WNL for therapy (but still elevated), but asymptomatic. Continued to educate on monitoring and following up with PCP regarding this. Pt does report he has an appt with the pharmacist next week for his BP meds. Today's skilled session focused on lumbar mobility and strengthening of hip musculature. Pt challenged by posterior pelvic tilts, needing max multi-modal cues, but pt was not able to correctly perform. Pt reporting incr pain when attempting. Pt feeling most relief from trunk rotations and bridging. At end of session pt reporting his pain decr to a 3/10 (was 6/10 at start of session). Will continue per POC.    OBJECTIVE IMPAIRMENTS: decreased activity tolerance, decreased mobility, decreased ROM, decreased strength, hypomobility, increased fascial restrictions, increased muscle spasms, impaired flexibility, postural dysfunction, and pain.   ACTIVITY LIMITATIONS: carrying, lifting, bending, standing, and squatting  PARTICIPATION LIMITATIONS: occupation and works on a farm   PERSONAL FACTORS: Past/current experiences, Profession,  Time since onset of injury/illness/exacerbation, and 1-2 comorbidities: Uncontrolled type 2 diabetes, Diabetic retinopathy, Essential HTN, low back pain   are also affecting patient's functional outcome.   REHAB POTENTIAL: Good  CLINICAL DECISION MAKING: Stable/uncomplicated  EVALUATION COMPLEXITY: Low   GOALS: Goals reviewed with patient? Yes  SHORT TERM GOALS: ALL STGS = LTGS   LONG TERM GOALS: Target date: 01/29/2023  Pt will be independent with final HEP for ROM/strengthening in order to build upon functional gains made in therapy. Baseline:  Goal status: INITIAL  2.  Pt will decr ODI to a 4 or less in order to demo decr disability with low back pain  Baseline: 10/50 = mild disability  Goal status: INITIAL  3.  Pt will report low back pain as a 2/10 or less in order to demo decr pain with functional activities.  Baseline: 4-5/10 Goal status: INITIAL  4.  Pt will demonstrate proper body mechanics for work tasks on the farm.  Baseline:  Goal status: INITIAL  5.  Pt will subjectively report being able to stand for longer amounts of time without low back in order to demo improved activity  tolerance for work.  Baseline:  Goal status: INITIAL   PLAN:  PT FREQUENCY: 2x/week  PT DURATION: 4 weeks  PLANNED INTERVENTIONS: Therapeutic exercises, Therapeutic activity, Neuromuscular re-education, Balance training, Gait training, Patient/Family education, Self Care, Joint mobilization, Dry Needling, Spinal mobilization, Moist heat, Manual therapy, and Re-evaluation.  PLAN FOR NEXT SESSION: Continue HEP- hip extension and ABD strength, stretching to hips/hamstring, and single knee to chest stretch. Spinal mobility. Pt reporting twisting felt good. 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

## 2023-01-18 ENCOUNTER — Ambulatory Visit: Payer: Self-pay | Attending: Physician Assistant | Admitting: Physical Therapy

## 2023-01-18 VITALS — BP 153/93 | HR 87

## 2023-01-18 DIAGNOSIS — M5459 Other low back pain: Secondary | ICD-10-CM

## 2023-01-18 DIAGNOSIS — M6281 Muscle weakness (generalized): Secondary | ICD-10-CM

## 2023-01-18 DIAGNOSIS — R293 Abnormal posture: Secondary | ICD-10-CM

## 2023-01-18 NOTE — Therapy (Signed)
OUTPATIENT PHYSICAL THERAPY THORACOLUMBAR TREATMENT   Patient Name: Corey Graham MRN: [id number] DOB:1976/03/12, 47 y.o., male Today's Date: 01/18/2023  END OF SESSION:  PT End of Session - 01/18/23 1015     Visit Number 5    Number of Visits 9    Date for PT Re-Evaluation 01/31/23    Authorization Type CAFA - Covered 100%    PT Start Time 1017    PT Stop Time 1058    PT Time Calculation (min) 41 min    Activity Tolerance Patient tolerated treatment well    Behavior During Therapy [facility] for tasks assessed/performed              Past Medical History:  Diagnosis Date   Diabetes mellitus without complication ([place]) 0347   Essential hypertension 09/09/2017   Low back pain 06/24/2017   Past Surgical History:  Procedure Laterality Date   HERNIA REPAIR     Patient Active Problem List   Diagnosis Date Noted   Moderate persistent asthma with acute exacerbation 02/12/2022   Yeast dermatitis of penis 08/30/2021   Uncircumcised male 08/30/2021   Branch retinal vein occlusion of right eye with macular edema 06/11/2019   Nuclear sclerotic cataract of both eyes 06/11/2019   Retinal edema 06/11/2019   Severe nonproliferative diabetic retinopathy of both eyes with macular edema associated with type 2 diabetes mellitus ([place]) 06/11/2019   Essential hypertension 09/09/2017   Low back pain 06/24/2017   Uncontrolled type 2 diabetes mellitus with hyperglycemia, without long-term current use of insulin ([place]) 12/25/2016    PCP:  Kerin Perna, NP  REFERRING PROVIDER: Persons, Bevely Palmer, PA  REFERRING DIAG: M54.50 (ICD-10-CM) - Lumbar pain   Rationale for Evaluation and Treatment: Rehabilitation  THERAPY DIAG:  Other low back pain  Abnormal posture  Muscle weakness (generalized)  ONSET DATE: 12/25/2022  SUBJECTIVE:                                                                                                                                                                                            SUBJECTIVE STATEMENT: Reports that chest is sore today and feels some muscular tightness after the trunk rotations and child's pose from the other day. Has been trying the proper squat technique at work, but sometimes he forgets. Reports yesterday was a really bad day.   Present with in-person interpreter Sunday Spillers   PERTINENT HISTORY:  PMH: Uncontrolled type 2 diabetes, Diabetic retinopathy, Essential HTN, low back pain   Per orthopedic doctor: Given his symptoms of fatigue in his leg question whether he has a stenosis. He is neurologically intact today. His pain  is accentuated with extension of his back.   PAIN:  Are you having pain? Yes: NPRS scale: 3-4/10 Pain location: Low back Pain description: "constant pain" Aggravating factors: Standing on his feet for too long  Relieving factors: Massage  Vitals:   01/18/23 1025  BP: (!) 153/93  Pulse: 87      PRECAUTIONS: None  WEIGHT BEARING RESTRICTIONS: No  FALLS:  Has patient fallen in last 6 months? No   OCCUPATION: Works full time at a farm  PLOF: Independent  PATIENT GOALS: Wants to feel better, get rid of the pain    OBJECTIVE:   DIAGNOSTIC FINDINGS:  MRI lumbar spine: 12/31/22  IMPRESSION: 1. Small central disc protrusion with facet hypertrophy at L3-4 with resultant mild canal and bilateral subarticular stenosis, with mild left L3 foraminal narrowing. 2. Disc bulge with facet hypertrophy at L4-5 with resultant mild to moderate bilateral subarticular stenosis, with mild bilateral L4 foraminal narrowing. 3. Small central disc protrusion at L5-S1 without significant stenosis or impingement. 4. Reactive marrow edema about the L4-5 facets bilaterally due to facet arthritis. Finding could serve as a source for lower back pain. 5. Severe left renal atrophy. No visible paraspinous or soft tissue mass.   TODAY'S TREATMENT:                                                                                                                        Therapeutic Exercise: Seated at edge of mat table: Seated forward flexion x5 reps  Seated forward flexion with bias to R/L, 3 reps each side, pt stiffer towards the R side   Tennis ball self massage to L>R lumbar lumbar paraspinals, pt reporting relief from this, gave tennis ball for self mobilization/massage at home   Prone press ups x5 reps for lumbar extension, pt with decr ROM and pt reporting arms felt more tired with this  Weight shifting posteriorly into child's pose > anteriorly x7 reps, pt reporting relief from this  Quadruped cat/cow x10 reps, pt feeling like he has more ROM with this exercise today after CPA mobilizations to upper lumbar/lower thoracic spine  Sidelying clamshells 2 sets of 10 reps bilat   Manual Therapy:  Central PA grade II-III mobilizations to Upper lumbar/lower thoracic spine for pain relief and to decr stiffness.    PATIENT EDUCATION:  Education details: BP safety; continue with HEP, Use of tennis ball for home for self mobilization/massage of lumbar paraspinals Person educated: Patient Education method: Explanation, Demonstration, and Verbal cues Education comprehension: verbalized understanding, returned demonstration, and needs further education  HOME EXERCISE PROGRAM: Access Code: [email or web address] URL: [email or web address] Date: 01/08/2023 Prepared by: Malachi Carl  Exercises - Seated Hamstring Stretch  - 1 x daily - 7 x weekly - 3 sets - 30 hold - Supine Lower Trunk Rotation  - 1 x daily - 7 x weekly - 3 sets - 10 reps - Supine Posterior Pelvic Tilt  - 1 x daily - 7 x weekly - 3 sets - 10"  hold - Supine Figure 4 Piriformis Stretch  - 1 x daily - 7 x weekly - 3 sets - 30" hold - Supine Bridge  - 1 x daily - 7 x weekly - 3 sets - 10 reps - Supine March  - 1 x daily - 7 x weekly - 3 sets - 10 reps - Supine Single Knee to Chest Stretch  - 1 x daily - 7 x weekly - 3 sets - 30  hold  ASSESSMENT:  CLINICAL IMPRESSION: Pt's BP WNL for therapy (but still elevated), but asymptomatic. Provided tennis ball for self mobilization for lumbar paraspinals (L>R) with pt feeling relief from this. Performed central PA grade II-III mobilizations to upper lumbar and lower thoracic spine to decr pain and stiffness. Pt reporting back felt much better after this and had improved ROM during cat/cows today. Will continue per POC.    OBJECTIVE IMPAIRMENTS: decreased activity tolerance, decreased mobility, decreased ROM, decreased strength, hypomobility, increased fascial restrictions, increased muscle spasms, impaired flexibility, postural dysfunction, and pain.   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   REHAB POTENTIAL: Good  CLINICAL DECISION MAKING: Stable/uncomplicated  EVALUATION COMPLEXITY: Low   GOALS: Goals reviewed with patient? Yes  SHORT TERM GOALS: ALL STGS = LTGS   LONG TERM GOALS: Target date: 01/29/2023  Pt will be independent with final HEP for ROM/strengthening in order to build upon functional gains made in therapy. Baseline:  Goal status: INITIAL  2.  Pt will decr ODI to a 4 or less in order to demo decr disability with low back pain  Baseline: 10/50 = mild disability  Goal status: INITIAL  3.  Pt will report low back pain as a 2/10 or less in order to demo decr pain with functional activities.  Baseline: 4-5/10 Goal status: INITIAL  4.  Pt will demonstrate proper body mechanics for work tasks on the farm.  Baseline:  Goal status: INITIAL  5.  Pt will subjectively report being able to stand for  longer amounts of time without low back in order to demo improved activity tolerance for work.  Baseline:  Goal status: INITIAL   PLAN:  PT FREQUENCY: 2x/week  PT DURATION: 4 weeks  PLANNED INTERVENTIONS: Therapeutic exercises, Therapeutic activity, Neuromuscular re-education, Balance training, Gait training, Patient/Family education, Self Care, Joint mobilization, Dry Needling, Spinal mobilization, Moist heat, Manual therapy, and Re-evaluation.  PLAN FOR NEXT SESSION: Continue HEP- hip extension and ABD strength, stretching to hips/hamstring, and single knee to chest stretch. Spinal mobility. Pt reporting twisting felt good. Central mobilizations to lower thoracic/upper lumbar spine. review HEP and assess vitals  Janann August, PT, DPT 01/18/23 11:29 AM

## 2023-01-21 ENCOUNTER — Other Ambulatory Visit: Payer: Self-pay

## 2023-01-21 ENCOUNTER — Encounter: Payer: Self-pay | Admitting: Pharmacist

## 2023-01-21 ENCOUNTER — Ambulatory Visit: Payer: Self-pay | Attending: Primary Care | Admitting: Pharmacist

## 2023-01-21 VITALS — BP 144/88 | HR 80

## 2023-01-21 DIAGNOSIS — I10 Essential (primary) hypertension: Secondary | ICD-10-CM

## 2023-01-21 MED ORDER — LOSARTAN POTASSIUM 50 MG PO TABS
50.0000 mg | ORAL_TABLET | Freq: Every day | ORAL | 0 refills | Status: DC
  Filled 2023-01-21 – 2023-02-19 (×2): qty 90, 90d supply, fill #0

## 2023-01-21 NOTE — Patient Instructions (Addendum)
Gracias por venir a vernos hoy.  La presin arterial hoy est mejorando.  Comience a tomar medicamentos para la presin arterial de la siguiente manera:  1. Contine tomando 1 comprimido de amlodipino de 10 mg una vez al [facility]. 2. Contine tomando 1 comprimido de HCTZ de 25 mg una vez al [facility]. 3. Empiece a tomar 2 comprimidos de losartn de 25 mg una vez al [place] se acaben. Luego, recoja el comprimido de 50 mg en la farmacia y comience a tomarlo una vez al da.  Lurena Joiner   English translation:  Thank you for coming to see [place] today.   Blood pressure today is improving.  Start taking blood pressure medications as follows:   Continue to take 1 tablet of amlodipine 10mg  once a day.  Continue to take 1 tablet of HCTZ 25 mg once a day.  Start taking 2 tablets of the losartan 25mg  once a day until you run out. Then, pick-up the 50 mg tablet from the pharmacy and start taking once a day.   Lurena Joiner

## 2023-01-21 NOTE — Progress Notes (Signed)
S:    PCP: Juluis Mire  47 y.o. male who presents for hypertension evaluation, education, and management.  PMH is significant for HTN, T2DM with diabetic retinopathy, and asthma.  Patient was referred and last seen by Primary Care Provider, Juluis Mire, NP, on 01/07/2023.   At last visit, BP was elevated at 163/94 mmHg and UACR elevated at 1,767. No changes were made to BP regimen.   Today, patient arrives in good spirits and presents with the assistance of an online interpretor. Denies dizziness, headache, blurred vision, swelling. He brings a BP cuff today. States he works all day and has stressful job, but doesn't have any difficulty remembering to take his medications. Reports difficulty sleeping and denies tobacco use. Does drink coffee, last intake ~1 hour ago. BP on home machine comparable to clinic BP.   Patient reports hypertension was diagnosed ~2 years ago.   Medication adherence reported, however patient is unaware of his medication names. Patient took his last dose of medications yesterday afternoon.   Current antihypertensives include: amlodipine 10 mg daily, HCTZ 25 mg daily, losartan 25 mg daily  Reported home BP readings: 126-140/90-98 mmHg, brings cuff in today.   Patient-reported exercise habits: goes to gym regularly.   O:  Vitals:   01/21/23 0849 01/21/23 0852  BP: (!) 143/91 (!) 144/88  Pulse:  80    Last 3 Office BP readings: BP Readings from Last 3 Encounters:  01/21/23 (!) 144/88  01/18/23 (!) 153/93  01/16/23 (!) 140/95    BMET    Component Value Date/Time   NA 137 12/05/2022 1200   K 4.4 12/05/2022 1200   CL 99 12/05/2022 1200   CO2 22 12/05/2022 1200   GLUCOSE 222 (H) 12/05/2022 1200   GLUCOSE 378 (H) 06/15/2014 2232   BUN 9 12/05/2022 1200   CREATININE 0.60 (L) 12/05/2022 1200   CALCIUM 9.4 12/05/2022 1200   GFRNONAA 113 02/09/2021 1013   GFRAA 131 02/09/2021 1013    Renal function: CrCl cannot be calculated (Patient's most  recent lab result is older than the maximum 21 days allowed.).  Clinical ASCVD: No  The 10-year ASCVD risk score (Arnett DK, et al., 2019) is: 9.1%   Values used to calculate the score:     Age: 69 years     Sex: Male     Is Non-Hispanic African American: No     Diabetic: Yes     Tobacco smoker: No     Systolic Blood Pressure: 875 mmHg     Is BP treated: Yes     HDL Cholesterol: 47 mg/dL     Total Cholesterol: 258 mg/dL   A/P: Hypertension diagnosed ~2 years ago, currently close to goal on current medications. BP goal < 130/80 mmHg. Medication adherence appears appropriate. Control is suboptimal due to needing therapy intensification.  -Increased dose of losartan to 50 mg daily given elevated UACR. -Continue amlodipine 10 mg daily and HCTZ 25 mg daily. -Patient educated on purpose, proper use, and potential adverse effects of amlodipine, HCTZ, and losartan.  -F/u labs ordered - BMET at next visit  -Counseled on lifestyle modifications for blood pressure control including reduced dietary sodium, increased exercise, adequate sleep. -Encouraged patient to check BP at home and bring log of readings to next visit. Counseled on proper use of home BP cuff.   Results reviewed and written information provided.    Written patient instructions provided. Patient verbalized understanding of treatment plan.  Total time in face to face counseling  35 minutes.    Follow-up:  Pharmacist 1 month. PCP clinic visit on 02/11/2023.   Joseph Art, Pharm.D. PGY-2 Ambulatory Care Pharmacy Resident 01/21/2023 4:10 PM

## 2023-01-23 ENCOUNTER — Ambulatory Visit: Payer: Self-pay | Admitting: Physical Therapy

## 2023-01-23 ENCOUNTER — Encounter: Payer: Self-pay | Admitting: Physical Therapy

## 2023-01-23 VITALS — BP 150/94 | HR 77

## 2023-01-23 DIAGNOSIS — R293 Abnormal posture: Secondary | ICD-10-CM

## 2023-01-23 DIAGNOSIS — M6281 Muscle weakness (generalized): Secondary | ICD-10-CM

## 2023-01-23 DIAGNOSIS — M5459 Other low back pain: Secondary | ICD-10-CM

## 2023-01-23 NOTE — Therapy (Signed)
OUTPATIENT PHYSICAL THERAPY THORACOLUMBAR TREATMENT   Patient Name: Corey Graham MRN: [id number] DOB:[birth year], 47 y.o., male Today's Date: 01/23/2023  END OF SESSION:  PT End of Session - 01/23/23 1019     Visit Number 6    Number of Visits 9    Date for PT Re-Evaluation 01/31/23    Authorization Type CAFA - Covered 100%    PT Start Time 1018    PT Stop Time 1058    PT Time Calculation (min) 40 min    Activity Tolerance Patient tolerated treatment well    Behavior During Therapy [facility] for tasks assessed/performed              Past Medical History:  Diagnosis Date   Diabetes mellitus without complication ([place]) 4098   Essential hypertension 09/09/2017   Low back pain 06/24/2017   Past Surgical History:  Procedure Laterality Date   HERNIA REPAIR     Patient Active Problem List   Diagnosis Date Noted   Moderate persistent asthma with acute exacerbation 02/12/2022   Yeast dermatitis of penis 08/30/2021   Uncircumcised male 08/30/2021   Branch retinal vein occlusion of right eye with macular edema 06/11/2019   Nuclear sclerotic cataract of both eyes 06/11/2019   Retinal edema 06/11/2019   Severe nonproliferative diabetic retinopathy of both eyes with macular edema associated with type 2 diabetes mellitus ([place]) 06/11/2019   Essential hypertension 09/09/2017   Low back pain 06/24/2017   Uncontrolled type 2 diabetes mellitus with hyperglycemia, without long-term current use of insulin ([place]) 12/25/2016    PCP:  Kerin Perna, NP  REFERRING PROVIDER: Persons, Bevely Palmer, PA  REFERRING DIAG: M54.50 (ICD-10-CM) - Lumbar pain   Rationale for Evaluation and Treatment: Rehabilitation  THERAPY DIAG:  Other low back pain  Muscle weakness (generalized)  Abnormal posture  ONSET DATE: 12/25/2022  SUBJECTIVE:                                                                                                                                                                                            SUBJECTIVE STATEMENT: Reports some days the pain is strong and some days it is less. It hurts in the beginning when he has to go work and then it stops hurting as much. Saw the pharmacist the other day and they increased the losartan. Reports the low back mobilizations felt good last time.   Present with in-person interpreter Sunday Spillers   PERTINENT HISTORY:  PMH: Uncontrolled type 2 diabetes, Diabetic retinopathy, Essential HTN, low back pain   Per orthopedic doctor: Given his symptoms of fatigue in his leg question whether he has  a stenosis. He is neurologically intact today. His pain is accentuated with extension of his back.   PAIN:  Are you having pain? Yes: NPRS scale: 2/10 Pain location: Low back Pain description: "constant pain" Aggravating factors: Standing on his feet for too long  Relieving factors: Massage  Vitals:   01/23/23 1023 01/23/23 1057  BP: (!) 144/96 (!) 150/94  Pulse: 80 77       PRECAUTIONS: None  WEIGHT BEARING RESTRICTIONS: No  FALLS:  Has patient fallen in last 6 months? No   OCCUPATION: Works full time at a farm  PLOF: Independent  PATIENT GOALS: Wants to feel better, get rid of the pain    OBJECTIVE:   DIAGNOSTIC FINDINGS:  MRI lumbar spine: 12/31/22  IMPRESSION: 1. Small central disc protrusion with facet hypertrophy at L3-4 with resultant mild canal and bilateral subarticular stenosis, with mild left L3 foraminal narrowing. 2. Disc bulge with facet hypertrophy at L4-5 with resultant mild to moderate bilateral subarticular stenosis, with mild bilateral L4 foraminal narrowing. 3. Small central disc protrusion at L5-S1 without significant stenosis or impingement. 4. Reactive marrow edema about the L4-5 facets bilaterally due to facet arthritis. Finding could serve as a source for lower back pain. 5. Severe left renal atrophy. No visible paraspinous or soft tissue mass.   TODAY'S TREATMENT:                                                                                                                        Therapeutic Exercise: Single knee to chest strech 3 x 30 seconds bilat with therapist providing overpressure to pt's tolerance  Supine hook-lying lower trunk rotations with yoga block hip ADD squeeze x10 reps each side, with gently improving AROM each time, more limited going to the L, pt performing slowly  Prone press up x10 reps on elbows for improved lumbar extension, pt gradually incr AROM with each rep Quadruped cat/cow x10 reps, pt feeling like he has more ROM with this exercise today Quadruped thread the needles for improved rotation x5 reps each side   Manual Therapy:  Central PA grade II-III mobilizations to Upper lumbar/lower thoracic spine for pain relief and to decr stiffness.    PATIENT EDUCATION:  Education details: continue with HEP, monitoring BP  Person educated: Patient Education method: Explanation, Demonstration, and Verbal cues Education comprehension: verbalized understanding, returned demonstration, and needs further education  HOME EXERCISE PROGRAM: Access Code: [email or web address] URL: [email or web address] Date: 01/08/2023 Prepared by: Malachi Carl  Exercises - Seated Hamstring Stretch  - 1 x daily - 7 x weekly - 3 sets - 30 hold - Supine Lower Trunk Rotation  - 1 x daily - 7 x weekly - 3 sets - 10 reps - Supine Posterior Pelvic Tilt  - 1 x daily - 7 x weekly - 3 sets - 10" hold - Supine Figure 4 Piriformis Stretch  - 1 x daily - 7 x weekly - 3 sets - 30" hold - Supine Bridge  - 1  x daily - 7 x weekly - 3 sets - 10 reps - Supine March  - 1 x daily - 7 x weekly - 3 sets - 10 reps - Supine Single Knee to Chest Stretch  - 1 x daily - 7 x weekly - 3 sets - 30 hold  ASSESSMENT:  CLINICAL IMPRESSION: Pt's BP WNL for therapy (but still elevated), but asymptomatic. Pt saw the pharmacist the other day and they increased pt's BP meds and he has been  taking it as prescribed. Performed central PA grade II-III mobilizations again today to upper lumbar and lower thoracic spine to decr pain and stiffness. Pt with incr AROM during prone press ups and quadruped cat/cow afterwards. Pt also subjectively reporting back felt better today. Remainder of session continued to focus on ROM and strengthening tasks for core musculature. Will continue per POC.    OBJECTIVE IMPAIRMENTS: decreased activity tolerance, decreased mobility, decreased ROM, decreased strength, hypomobility, increased fascial restrictions, increased muscle spasms, impaired flexibility, postural dysfunction, and pain.   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   REHAB POTENTIAL: Good  CLINICAL DECISION MAKING: Stable/uncomplicated  EVALUATION COMPLEXITY: Low   GOALS: Goals reviewed with patient? Yes  SHORT TERM GOALS: ALL STGS = LTGS   LONG TERM GOALS: Target date: 01/29/2023  Pt will be independent with final HEP for ROM/strengthening in order to build upon functional gains made in therapy. Baseline:  Goal status: INITIAL  2.  Pt will decr ODI to a 4 or less in order to demo decr disability with low back pain  Baseline: 10/50 = mild disability  Goal status: INITIAL  3.  Pt will report low back pain as a 2/10 or less in order to demo decr pain with functional activities.  Baseline: 4-5/10 Goal status: INITIAL  4.  Pt will demonstrate proper body mechanics for work tasks on the farm.  Baseline:  Goal status: INITIAL  5.  Pt will subjectively report being able to stand for longer amounts of time without low back in order  to demo improved activity tolerance for work.  Baseline:  Goal status: INITIAL   PLAN:  PT FREQUENCY: 2x/week  PT DURATION: 4 weeks  PLANNED INTERVENTIONS: Therapeutic exercises, Therapeutic activity, Neuromuscular re-education, Balance training, Gait training, Patient/Family education, Self Care, Joint mobilization, Dry Needling, Spinal mobilization, Moist heat, Manual therapy, and Re-evaluation.  PLAN FOR NEXT SESSION: Continue HEP- hip extension and ABD strength, stretching to hips/hamstring, and single knee to chest stretch. Spinal mobility. Pt reporting twisting felt good. Central mobilizations to lower thoracic/upper lumbar spine. review HEP and assess vitals. SciFit if BP allows   Janann August, PT, DPT 01/23/23 11:07 AM

## 2023-01-24 ENCOUNTER — Emergency Department (HOSPITAL_COMMUNITY): Payer: Self-pay

## 2023-01-24 ENCOUNTER — Other Ambulatory Visit: Payer: Self-pay

## 2023-01-24 ENCOUNTER — Emergency Department (HOSPITAL_COMMUNITY)
Admission: EM | Admit: 2023-01-24 | Discharge: 2023-01-25 | Disposition: A | Discharge status: Home / Self Care | Payer: Self-pay | Attending: Emergency Medicine | Admitting: Emergency Medicine

## 2023-01-24 DIAGNOSIS — R109 Unspecified abdominal pain: Secondary | ICD-10-CM

## 2023-01-24 DIAGNOSIS — R1032 Left lower quadrant pain: Secondary | ICD-10-CM | POA: Insufficient documentation

## 2023-01-24 LAB — COMPREHENSIVE METABOLIC PANEL
ALT: 34 U/L (ref 0–44)
AST: 31 U/L (ref 15–41)
Albumin: 3.6 g/dL (ref 3.5–5.0)
Alkaline Phosphatase: 90 U/L (ref 38–126)
Anion gap: 11 (ref 5–15)
BUN: 9 mg/dL (ref 6–20)
CO2: 24 mmol/L (ref 22–32)
Calcium: 9.1 mg/dL (ref 8.9–10.3)
Chloride: 100 mmol/L (ref 98–111)
Creatinine, Ser: 0.69 mg/dL (ref 0.61–1.24)
GFR, Estimated: >60 mL/min (ref >60)
Glucose, Bld: 289 mg/dL — ABNORMAL HIGH (ref 70–99)
Potassium: 4 mmol/L (ref 3.5–5.1)
Sodium: 135 mmol/L (ref 135–145)
Total Bilirubin: 0.9 mg/dL (ref 0.3–1.2)
Total Protein: 6.6 g/dL (ref 6.5–8.1)

## 2023-01-24 LAB — CBC WITH DIFFERENTIAL/PLATELET
Abs Immature Granulocytes: 0.02 10*3/uL (ref 0.00–0.07)
Basophils Absolute: 0.1 10*3/uL (ref 0.0–0.1)
Basophils Relative: 2 %
Eosinophils Absolute: 0.9 10*3/uL — ABNORMAL HIGH (ref 0.0–0.5)
Eosinophils Relative: 11 %
HCT: 42.1 % (ref 39.0–52.0)
Hemoglobin: 14.3 g/dL (ref 13.0–17.0)
Immature Granulocytes: 0 %
Lymphocytes Relative: 40 %
Lymphs Abs: 3.5 10*3/uL (ref 0.7–4.0)
MCH: 29.9 pg (ref 26.0–34.0)
MCHC: 34 g/dL (ref 30.0–36.0)
MCV: 87.9 fL (ref 80.0–100.0)
Monocytes Absolute: 0.5 10*3/uL (ref 0.1–1.0)
Monocytes Relative: 6 %
Neutro Abs: 3.5 10*3/uL (ref 1.7–7.7)
Neutrophils Relative %: 41 %
Platelets: 196 10*3/uL (ref 150–400)
RBC: 4.79 MIL/uL (ref 4.22–5.81)
RDW: 12.1 % (ref 11.5–15.5)
WBC: 8.5 10*3/uL (ref 4.0–10.5)
nRBC: 0 % (ref 0.0–0.2)

## 2023-01-24 NOTE — ED Triage Notes (Signed)
Patient reports pain at left groin for several days , denies injury , no dysuria or hematuria .

## 2023-01-24 NOTE — ED Provider Notes (Signed)
[facility] Emergency Department Provider Note MRN:  [id number]  Arrival date & time: 01/25/23     Chief Complaint   Left Groin Pain    History of Present Illness   Corey Graham is a 47 y.o. year-old male presents to the ED with chief complaint of LLQ abdominal pain for the past "while," but states that it worsened over the past day or two.  Hx of hernia with mesh repair.  Denies urinary symptoms.  Denies fevers, chills, nausea, or vomiting.  Worse with standing and walking.  History provided by patient.   Review of Systems  Pertinent positive and negative review of systems noted in HPI.    Physical Exam   Vitals:   01/24/23 2245 01/25/23 0141  BP: (!) 150/94 (!) 136/99  Pulse: 67 71  Resp:  18  Temp:  98.1 F (36.7 C)  SpO2: 97% 100%    CONSTITUTIONAL:  well-appearing, NAD NEURO:  Alert and oriented x 3, CN 3-12 grossly intact EYES:  eyes equal and reactive ENT/NECK:  Supple, no stridor  CARDIO:  normal rate, regular rhythm, appears well-perfused  PULM:  No respiratory distress, CTAB GI/GU:  non-distended, mild left inguinal tenderness without obvious hernia, no abscess MSK/SPINE:  No gross deformities, no edema, moves all extremities  SKIN:  no rash, atraumatic   *Additional and/or pertinent findings included in MDM below  Diagnostic and Interventional Summary    EKG Interpretation  Date/Time:    Ventricular Rate:    PR Interval:    QRS Duration:   QT Interval:    QTC Calculation:   R Axis:     Text Interpretation:         Labs Reviewed  CBC WITH DIFFERENTIAL/PLATELET - Abnormal; Notable for the following components:      Result Value   Eosinophils Absolute 0.9 (*)    All other components within normal limits  COMPREHENSIVE METABOLIC PANEL - Abnormal; Notable for the following components:   Glucose, Bld 289 (*)    All other components within normal limits    CT ABDOMEN PELVIS W CONTRAST  Final Result       Medications  iohexol (OMNIPAQUE) 350 MG/ML injection 75 mL (75 mLs Intravenous Contrast Given 01/25/23 0002)     Procedures  /  Critical Care Procedures  ED Course and Medical Decision Making  I have reviewed the triage vital signs, the nursing notes, and pertinent available records from the EMR.  Social Determinants Affecting Complexity of Care: Patient has no clinically significant social determinants affecting this chief complaint..   ED Course:    Medical Decision Making Here with LLQ abdominal pain.  Hx of hernia repair with mesh.  No palpable hernia.  No redness.  No abscess.  Will check CT to rule out complication.  CT negative.  Denies hematuria or dysuria.  Unable to provide urine sample, but since no symptoms, will cancel.  Recommend surgery follow-up.  Amount and/or Complexity of Data Reviewed Labs: ordered.    Details: No leukocytosis Hyperglycemia Radiology: ordered.  Risk Prescription drug management.     Consultants: No consultations were needed in caring for this patient.   Treatment and Plan: Emergency department workup does not suggest an emergent condition requiring admission or immediate intervention beyond  what has been performed at this time. The patient is safe for discharge and has  been instructed to return immediately for worsening symptoms, change in  symptoms or any other concerns    Final Clinical  Impressions(s) / ED Diagnoses     ICD-10-CM   1. Left lateral abdominal pain  R10.9       ED Discharge Orders          Ordered    ibuprofen (ADVIL) 600 MG tablet  Every 6 hours PRN        01/25/23 0021              Discharge Instructions Discussed with and Provided to Patient:     Discharge Instructions      No se identific una causa clara de su dolor. Haga un seguimiento con su mdico o con el cirujano general indicado.  Por favor regrese si los sntomas son nuevos o [place].       Montine Circle, PA-C 01/25/23  [id number]    Noemi Chapel, MD 01/27/23 [phone]

## 2023-01-25 ENCOUNTER — Ambulatory Visit: Payer: Self-pay | Admitting: Physical Therapy

## 2023-01-25 ENCOUNTER — Emergency Department (HOSPITAL_COMMUNITY): Payer: Self-pay

## 2023-01-25 ENCOUNTER — Other Ambulatory Visit: Payer: Self-pay

## 2023-01-25 DIAGNOSIS — M5459 Other low back pain: Secondary | ICD-10-CM

## 2023-01-25 MED ORDER — IOHEXOL 350 MG/ML SOLN
75.0000 mL | Freq: Once | INTRAVENOUS | Status: AC | PRN
  Administered 2023-01-25: 75 mL via INTRAVENOUS

## 2023-01-25 MED ORDER — IBUPROFEN 600 MG PO TABS
600.0000 mg | ORAL_TABLET | Freq: Four times a day (QID) | ORAL | 0 refills | Status: DC | PRN
  Filled 2023-01-25: qty 30, 8d supply, fill #0

## 2023-01-25 NOTE — Therapy (Signed)
OUTPATIENT PHYSICAL THERAPY THORACOLUMBAR TREATMENT - ARRIVED NO CHARGE    Patient Name: Corey Graham MRN: [id number] DOB:01-29-1976, 47 y.o., male Today's Date: 01/25/2023  END OF SESSION:  PT End of Session - 01/25/23 1023     Visit Number 6   arrived no charge   Number of Visits 9    Date for PT Re-Evaluation 01/31/23    Authorization Type CAFA - Covered 100%    PT Start Time 75    PT Stop Time 1020   arrived no charge   PT Time Calculation (min) 7 min    Activity Tolerance Patient limited by pain    Behavior During Therapy [facility] for tasks assessed/performed              Past Medical History:  Diagnosis Date   Diabetes mellitus without complication ([place]) 0000000   Essential hypertension 09/09/2017   Low back pain 06/24/2017   Past Surgical History:  Procedure Laterality Date   HERNIA REPAIR     Patient Active Problem List   Diagnosis Date Noted   Moderate persistent asthma with acute exacerbation 02/12/2022   Yeast dermatitis of penis 08/30/2021   Uncircumcised male 08/30/2021   Branch retinal vein occlusion of right eye with macular edema 06/11/2019   Nuclear sclerotic cataract of both eyes 06/11/2019   Retinal edema 06/11/2019   Severe nonproliferative diabetic retinopathy of both eyes with macular edema associated with type 2 diabetes mellitus ([place]) 06/11/2019   Essential hypertension 09/09/2017   Low back pain 06/24/2017   Uncontrolled type 2 diabetes mellitus with hyperglycemia, without long-term current use of insulin ([place]) 12/25/2016    PCP:  Kerin Perna, NP  REFERRING PROVIDER: Persons, Bevely Palmer, PA  REFERRING DIAG: M54.50 (ICD-10-CM) - Lumbar pain   Rationale for Evaluation and Treatment: Rehabilitation  THERAPY DIAG:  Other low back pain  ONSET DATE: 12/25/2022  SUBJECTIVE:                                                                                                                                                                                            SUBJECTIVE STATEMENT: Reports some days the pain is strong and some days it is less. It hurts in the beginning when he has to go work and then it stops hurting as much. Saw the pharmacist the other day and they increased the losartan. Reports the low back mobilizations felt good last time.   Present with in-person interpreter Sunday Spillers   PERTINENT HISTORY:  PMH: Uncontrolled type 2 diabetes, Diabetic retinopathy, Essential HTN, low back pain   Per orthopedic doctor: Given his symptoms of fatigue in  his leg question whether he has a stenosis. He is neurologically intact today. His pain is accentuated with extension of his back.   PAIN:  Are you having pain? Yes: NPRS scale: 2/10 Pain location: Low back Pain description: "constant pain" Aggravating factors: Standing on his feet for too long  Relieving factors: Massage  There were no vitals filed for this visit.      PRECAUTIONS: None  WEIGHT BEARING RESTRICTIONS: No  FALLS:  Has patient fallen in last 6 months? No   OCCUPATION: Works full time at a farm  PLOF: Independent  PATIENT GOALS: Wants to feel better, get rid of the pain    OBJECTIVE:   DIAGNOSTIC FINDINGS:  MRI lumbar spine: 12/31/22  IMPRESSION: 1. Small central disc protrusion with facet hypertrophy at L3-4 with resultant mild canal and bilateral subarticular stenosis, with mild left L3 foraminal narrowing. 2. Disc bulge with facet hypertrophy at L4-5 with resultant mild to moderate bilateral subarticular stenosis, with mild bilateral L4 foraminal narrowing. 3. Small central disc protrusion at L5-S1 without significant stenosis or impingement. 4. Reactive marrow edema about the L4-5 facets bilaterally due to facet arthritis. Finding could serve as a source for lower back pain. 5. Severe left renal atrophy. No visible paraspinous or soft tissue mass.   TODAY'S TREATMENT:                                                                                                                        Pt arrived to PT today after being in the ER last night until about 2 AM for LLQ abdominal pain. Was sent home as it did no require admission or an emergent condition. Pt then had to go right to work and has not slept since. They advised pt to follow-up with surgeon. Pt is still in pain and not feeling well and does not want to participate in PT today. Pt to plan to make appt with physician on Monday and educated that if pt's pain gets worse and still not feeling better, then might need to go back to the ER. Pt in agreement with plan.   PATIENT EDUCATION:  Education details: continue with HEP, monitoring BP  Person educated: Patient Education method: Explanation, Demonstration, and Verbal cues Education comprehension: verbalized understanding, returned demonstration, and needs further education  HOME EXERCISE PROGRAM: Access Code: [email or web address] URL: [email or web address] Date: 01/08/2023 Prepared by: Malachi Carl  Exercises - Seated Hamstring Stretch  - 1 x daily - 7 x weekly - 3 sets - 30 hold - Supine Lower Trunk Rotation  - 1 x daily - 7 x weekly - 3 sets - 10 reps - Supine Posterior Pelvic Tilt  - 1 x daily - 7 x weekly - 3 sets - 10" hold - Supine Figure 4 Piriformis Stretch  - 1 x daily - 7 x weekly - 3 sets - 30" hold - Supine Bridge  - 1 x daily - 7 x weekly - 3 sets - 10 reps - Supine  March  - 1 x daily - 7 x weekly - 3 sets - 10 reps - Supine Single Knee to Chest Stretch  - 1 x daily - 7 x weekly - 3 sets - 30 hold  ASSESSMENT:  CLINICAL IMPRESSION: See above - Arrived no charge.    OBJECTIVE IMPAIRMENTS: decreased activity tolerance, decreased mobility, decreased ROM, decreased strength, hypomobility, increased fascial restrictions, increased muscle spasms, impaired flexibility, postural dysfunction, and pain.   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   REHAB POTENTIAL: Good  CLINICAL DECISION MAKING: Stable/uncomplicated  EVALUATION COMPLEXITY: Low   GOALS: Goals reviewed with patient? Yes  SHORT TERM GOALS: ALL STGS = LTGS   LONG TERM GOALS: Target date: 01/29/2023  Pt will be independent with final HEP for ROM/strengthening in order to build upon functional gains made in therapy. Baseline:  Goal status: INITIAL  2.  Pt will decr ODI to a 4 or less in order to demo decr disability with low back pain  Baseline: 10/50 = mild disability  Goal status: INITIAL  3.  Pt will report low back pain as a 2/10 or less in order to demo decr pain with functional activities.  Baseline: 4-5/10 Goal status: INITIAL  4.  Pt will demonstrate proper body mechanics for work tasks on the farm.  Baseline:  Goal status: INITIAL  5.  Pt will subjectively report being able to stand for longer amounts of time without low back in order to demo improved activity tolerance for work.  Baseline:  Goal status: INITIAL   PLAN:  PT FREQUENCY: 2x/week  PT DURATION: 4 weeks  PLANNED INTERVENTIONS: Therapeutic exercises, Therapeutic activity, Neuromuscular re-education, Balance training, Gait training, Patient/Family education, Self Care, Joint mobilization, Dry Needling, Spinal mobilization, Moist heat, Manual therapy, and Re-evaluation.  PLAN FOR NEXT SESSION: Continue HEP- hip extension and ABD strength, stretching to hips/hamstring, and single knee to chest stretch. Spinal mobility. Pt reporting twisting felt good. Central mobilizations to lower thoracic/upper lumbar spine. review HEP and assess vitals. SciFit if BP allows    Janann August, PT, DPT 01/25/23 10:24 AM

## 2023-01-25 NOTE — Discharge Instructions (Addendum)
No se identific una causa clara de su dolor. Haga un seguimiento con su mdico o con el cirujano general indicado.  Por favor regrese si los sntomas son nuevos o [place].

## 2023-01-28 ENCOUNTER — Encounter: Payer: Self-pay | Admitting: Physical Therapy

## 2023-01-28 ENCOUNTER — Ambulatory Visit: Payer: Self-pay | Admitting: Physical Therapy

## 2023-01-28 VITALS — BP 137/89 | HR 84

## 2023-01-28 DIAGNOSIS — M6281 Muscle weakness (generalized): Secondary | ICD-10-CM

## 2023-01-28 DIAGNOSIS — M5459 Other low back pain: Secondary | ICD-10-CM

## 2023-01-28 DIAGNOSIS — R293 Abnormal posture: Secondary | ICD-10-CM

## 2023-01-28 NOTE — Therapy (Signed)
OUTPATIENT PHYSICAL THERAPY THORACOLUMBAR TREATMENT/DISCHARGE SUMMARY   Patient Name: Corey Graham MRN: [id number] DOB:[birth year], 47 y.o., male Today's Date: 01/28/2023  END OF SESSION:  PT End of Session - 01/28/23 1017     Visit Number 7    Number of Visits 9    Date for PT Re-Evaluation 01/31/23    Authorization Type CAFA - Covered 100%    PT Start Time 1015    PT Stop Time 1058    PT Time Calculation (min) 43 min    Activity Tolerance Patient tolerated treatment well    Behavior During Therapy [facility] for tasks assessed/performed              Past Medical History:  Diagnosis Date   Diabetes mellitus without complication ([place]) 0000000   Essential hypertension 09/09/2017   Low back pain 06/24/2017   Past Surgical History:  Procedure Laterality Date   HERNIA REPAIR     Patient Active Problem List   Diagnosis Date Noted   Moderate persistent asthma with acute exacerbation 02/12/2022   Yeast dermatitis of penis 08/30/2021   Uncircumcised male 08/30/2021   Branch retinal vein occlusion of right eye with macular edema 06/11/2019   Nuclear sclerotic cataract of both eyes 06/11/2019   Retinal edema 06/11/2019   Severe nonproliferative diabetic retinopathy of both eyes with macular edema associated with type 2 diabetes mellitus ([place]) 06/11/2019   Essential hypertension 09/09/2017   Low back pain 06/24/2017   Uncontrolled type 2 diabetes mellitus with hyperglycemia, without long-term current use of insulin ([place]) 12/25/2016    PCP:  Kerin Perna, NP  REFERRING PROVIDER: Persons, Bevely Palmer, PA  REFERRING DIAG: M54.50 (ICD-10-CM) - Lumbar pain   Rationale for Evaluation and Treatment: Rehabilitation  THERAPY DIAG:  Other low back pain  Abnormal posture  Muscle weakness (generalized)  ONSET DATE: 12/25/2022  SUBJECTIVE:                                                                                                                                                                                            SUBJECTIVE STATEMENT: Reports back is feeling better today. Reports abdominal pain is very mild. Reports his son has been helping him with massaging his back and that has been helping. Feels like that is what he has needed the most. Will be seeing the orthopedic doctor on march 6th.  Present with in-person interpreter Raquel    PERTINENT HISTORY:  PMH: Uncontrolled type 2 diabetes, Diabetic retinopathy, Essential HTN, low back pain   Per orthopedic doctor: Given his symptoms of fatigue in his leg question whether he has a stenosis. He  is neurologically intact today. His pain is accentuated with extension of his back.   PAIN:  Are you having pain? Yes: NPRS scale: 2-3/10 Pain location: Low back Pain description: "constant pain" Aggravating factors: Standing on his feet for too long  Relieving factors: Massage  Abdominal pain is a 1/10.   Vitals:   01/28/23 1031  BP: 137/89  Pulse: 84      PRECAUTIONS: None  WEIGHT BEARING RESTRICTIONS: No  FALLS:  Has patient fallen in last 6 months? No   OCCUPATION: Works full time at a farm  PLOF: Independent  PATIENT GOALS: Wants to feel better, get rid of the pain    OBJECTIVE:   DIAGNOSTIC FINDINGS:  MRI lumbar spine: 12/31/22  IMPRESSION: 1. Small central disc protrusion with facet hypertrophy at L3-4 with resultant mild canal and bilateral subarticular stenosis, with mild left L3 foraminal narrowing. 2. Disc bulge with facet hypertrophy at L4-5 with resultant mild to moderate bilateral subarticular stenosis, with mild bilateral L4 foraminal narrowing. 3. Small central disc protrusion at L5-S1 without significant stenosis or impingement. 4. Reactive marrow edema about the L4-5 facets bilaterally due to facet arthritis. Finding could serve as a source for lower back pain. 5. Severe left renal atrophy. No visible paraspinous or soft tissue mass.   TODAY'S  TREATMENT:         Therapeutic Activity: Assessed goals (see below) ODI: 6/50  Reviewed proper body mechanics for work tasks as pt continues to perform bending over and grabbing objects with lumbar flexion rather than squat technique, performed x8 reps with use of 20-30# in crate and reviewed technique and IMPORTANCE of performing this daily at work to assist with proper body mechanics for his back. Pt able to verbalize and demonstrate understanding of importance. Also discussed if pt has to turn, then to make sure he turns his whole body vs. His trunk.            Discussed pt asking for any work modifications for his job at the farm (pt reports he is unable to get any modifications)                                                                                                     Access Code: [email or web address] URL: [email or web address] Date: 01/28/2023 Prepared by: Janann August  Reviewed/finalized HEP, see [place] for more info.  Exercises - Seated Hamstring Stretch  - 1 x daily - 7 x weekly - 3 sets - 30 hold - Supine Lower Trunk Rotation  - 1 x daily - 7 x weekly - 3 sets - 10 reps - Supine Posterior Pelvic Tilt  - 1 x daily - 7 x weekly - 3 sets - 10" hold - Supine Figure 4 Piriformis Stretch  - 1 x daily - 7 x weekly - 3 sets - 30" hold - Supine Bridge  - 1 x daily - 7 x weekly - 3 sets - 10 reps - Supine March  - 1 x daily - 7 x weekly - 3 sets - 10 reps - Supine Single Knee to Chest Stretch  -  1 x daily - 7 x weekly - 3 sets - 30 hold - Cat Cow  - 1 x daily - 7 x weekly - 1-2 sets - 10 reps - Prone Press Up On Elbows  - 1 x daily - 7 x weekly - 1-2 sets - 10 reps  PATIENT EDUCATION:  Education details: See therapeutic activity, reviewed goals, D/C.  Person educated: Patient Education method: Explanation, Demonstration, and Verbal cues Education comprehension: verbalized understanding, returned demonstration, and needs further education  HOME EXERCISE  PROGRAM: Access Code: [email or web address]  PHYSICAL THERAPY DISCHARGE SUMMARY  Visits from Start of Care: 7  Current functional level related to goals / functional outcomes: See LTGs/Clinical assessment statement.    Remaining deficits: Low back pain (made worse by pt's job working on a farm, pt is already performing modified job duties and reports nothing else can be done for him at work), Cytogeneticist / Equipment: HEP, education on Designer, fashion/clothing for work tasks   Patient agrees to discharge. Patient goals were partially met. Patient is being discharged due to being pleased with the current functional level.   ASSESSMENT:  CLINICAL IMPRESSION: Began to check pt's LTGs. Pt has met 2 out of 5 LTGs, partially met 2 out of 5, and did not meet LTG #3. Pt improved his ODI score to a 6/50 (indicating only very mild disability, and previous score was a 10/50), but not quite to goal level. Pt reports improvements in his pain levels and is performing his HEP consistently. Pt's continued pain is in regards to his very demanding job at the farm where he works 7 days a week and does not get much sleep. Per pt, there are no modifications that can be made to his current position. However, pt does report able to stand longer at his job with less back pain. Reviewed again today proper body mechanics, like squatting, for work tasks and how important it is to practice this daily. Pt is pleased with his progress with PT and will be discharged at this time due to progress and pt being pleased with his current functional level.    OBJECTIVE IMPAIRMENTS: decreased activity tolerance, decreased mobility, decreased ROM, decreased strength, hypomobility, increased fascial restrictions, increased muscle spasms, impaired flexibility, postural dysfunction, and pain.   ACTIVITY LIMITATIONS: carrying, lifting, bending, standing, and squatting  PARTICIPATION LIMITATIONS: occupation and works on a farm    PERSONAL FACTORS: Past/current experiences, Profession, Time since onset of injury/illness/exacerbation, and 1-2 comorbidities: Uncontrolled type 2 diabetes, Diabetic retinopathy, Essential HTN, low back pain   are also affecting patient's functional outcome.   REHAB POTENTIAL: Good  CLINICAL DECISION MAKING: Stable/uncomplicated  EVALUATION COMPLEXITY: Low   GOALS: Goals reviewed with patient? Yes  SHORT TERM GOALS: ALL STGS = LTGS   LONG TERM GOALS: Target date: 01/29/2023  Pt will be independent with final HEP for ROM/strengthening in order to build upon functional gains made in therapy. Baseline:  Goal status: MET  2.  Pt will decr ODI to a 4 or less in order to demo decr disability with low back pain  Baseline: 10/50 = mild disability, 6/50 on 01/28/23 Goal status: NOT MET  3.  Pt will report low back pain as a 2/10 or less in order to demo decr pain with functional activities.  Baseline: 4-5/10,   pt reports sometimes it incr and other times it goes away competely, but pain is a 2-3/10 right now  Goal status: PARTIALLY MET  4.  Pt will demonstrate proper body mechanics for work tasks on the farm.  Baseline: needed to review proper squat technique on 01/28/23 Goal status: PARTIALLY MET  5.  Pt will subjectively report being able to stand for longer amounts of time without low back in order to demo improved activity tolerance for work.  Baseline:  Goal status: MET   PLAN:  PT FREQUENCY: 2x/week  PT DURATION: 4 weeks  PLANNED INTERVENTIONS: Therapeutic exercises, Therapeutic activity, Neuromuscular re-education, Balance training, Gait training, Patient/Family education, Self Care, Joint mobilization, Dry Needling, Spinal mobilization, Moist heat, Manual therapy, and Re-evaluation.  PLAN FOR NEXT SESSION: D/C from PT  Janann August, PT, DPT 01/28/23 11:00 AM

## 2023-01-30 ENCOUNTER — Ambulatory Visit: Payer: Self-pay | Admitting: Orthopedic Surgery

## 2023-01-31 ENCOUNTER — Other Ambulatory Visit: Payer: Self-pay

## 2023-02-06 ENCOUNTER — Other Ambulatory Visit: Payer: Self-pay

## 2023-02-08 ENCOUNTER — Other Ambulatory Visit: Payer: Self-pay

## 2023-02-11 ENCOUNTER — Ambulatory Visit (INDEPENDENT_AMBULATORY_CARE_PROVIDER_SITE_OTHER): Payer: Self-pay | Admitting: Primary Care

## 2023-02-13 ENCOUNTER — Other Ambulatory Visit: Payer: Self-pay

## 2023-02-19 ENCOUNTER — Other Ambulatory Visit: Payer: Self-pay

## 2023-02-19 ENCOUNTER — Ambulatory Visit: Payer: Self-pay | Attending: Primary Care | Admitting: Pharmacist

## 2023-02-19 ENCOUNTER — Encounter: Payer: Self-pay | Admitting: Pharmacist

## 2023-02-19 VITALS — BP 124/82 | HR 80

## 2023-02-19 DIAGNOSIS — I10 Essential (primary) hypertension: Secondary | ICD-10-CM

## 2023-02-19 NOTE — Progress Notes (Signed)
S:    PCP: Corey Graham  47 y.o. male who presents for hypertension evaluation, education, and management.  PMH is significant for HTN, T2DM with diabetic retinopathy, and asthma.  Patient was referred and last seen by Primary Care Provider, Corey Mire, NP, on 01/07/2023. Pharmacy saw him on 01/21/2023 and increased losartan to 50 mg daily.   Today, patient arrives in good spirits and presents with the assistance of an online interpretor. Denies dizziness, headache, blurred vision, swelling.   His fill history in our system is concerning for non-adherence. All medications have past due next fill dates. Today, patient reports that he is picking up the medications after this appointment. He has not taken any BP medications for the past ~5 days.  Current antihypertensives include: amlodipine 10 mg daily, HCTZ 25 mg daily, losartan 50 mg daily  Reported home BP readings: has not taken since his last appt with [place].  Patient-reported exercise habits: goes to gym regularly.   Patient-reported dietary habits: denies excessive intake of sodium. Drinks coffee in the mornings but no excessive intake reported.   O:  Vitals:   02/19/23 0842  BP: 124/82  Pulse: 80   Last 3 Office BP readings: BP Readings from Last 3 Encounters:  02/19/23 124/82  01/28/23 137/89  01/25/23 (!) 136/99   BMET    Component Value Date/Time   NA 135 01/24/2023 2252   NA 137 12/05/2022 1200   K 4.0 01/24/2023 2252   CL 100 01/24/2023 2252   CO2 24 01/24/2023 2252   GLUCOSE 289 (H) 01/24/2023 2252   BUN 9 01/24/2023 2252   BUN 9 12/05/2022 1200   CREATININE 0.69 01/24/2023 2252   CALCIUM 9.1 01/24/2023 2252   GFRNONAA >60 01/24/2023 2252   GFRAA 131 02/09/2021 1013    Renal function: CrCl cannot be calculated (Patient's most recent lab result is older than the maximum 21 days allowed.).  Clinical ASCVD: No  The 10-year ASCVD risk score (Arnett DK, et al., 2019) is: 7.1%   Values used to  calculate the score:     Age: 48 years     Sex: Male     Is Non-Hispanic African American: No     Diabetic: Yes     Tobacco smoker: No     Systolic Blood Pressure: [id number] mmHg     Is BP treated: Yes     HDL Cholesterol: 47 mg/dL     Total Cholesterol: 258 mg/dL   A/P: Hypertension diagnosed ~2 years ago, currently at goal on current medications. BP goal < 130/80 mmHg. Blood pressures in clinic have continued to trend down. Medication adherence appears suboptimal, but I will have him refill and resume these today. -Continue losartan 50 mg daily. -Continue amlodipine 10 mg daily and HCTZ 25 mg daily. -Patient educated on purpose, proper use, and potential adverse effects of amlodipine, HCTZ, and losartan.  -F/u labs ordered - recommend renal function at next visit. Did not get today d/t non-adherence.  -Counseled on lifestyle modifications for blood pressure control including reduced dietary sodium, increased exercise, adequate sleep. -Encouraged patient to check BP at home and bring log of readings to next visit. Counseled on proper use of home BP cuff.   Results reviewed and written information provided.    Written patient instructions provided. Patient verbalized understanding of treatment plan.  Total time in face to face counseling 35 minutes.    Follow-up:  Pharmacist 1 month. PCP clinic visit on 03/14/2023.   Joseph Art, Pharm.D.  PGY-2 Ambulatory Care Pharmacy Resident 02/19/2023 8:45 AM

## 2023-02-20 ENCOUNTER — Ambulatory Visit (INDEPENDENT_AMBULATORY_CARE_PROVIDER_SITE_OTHER): Payer: Self-pay | Admitting: Orthopedic Surgery

## 2023-02-20 ENCOUNTER — Encounter: Payer: Self-pay | Admitting: Orthopedic Surgery

## 2023-02-20 ENCOUNTER — Ambulatory Visit (INDEPENDENT_AMBULATORY_CARE_PROVIDER_SITE_OTHER): Payer: Self-pay

## 2023-02-20 VITALS — BP 150/93 | HR 81 | Ht 64.5 in | Wt 168.0 lb

## 2023-02-20 DIAGNOSIS — M545 Low back pain, unspecified: Secondary | ICD-10-CM

## 2023-02-20 NOTE — Progress Notes (Signed)
Orthopedic Spine Surgery Office Note  Assessment: Patient is a 47 y.o. male with chronic low back pain with no radicular symptoms   Plan: -Explained that initially conservative treatment is tried as a significant number of patients may experience relief with these treatment modalities. Discussed that the conservative treatments include:  -activity modification  -physical therapy  -over the counter pain medications  -medrol dosepak  -lumbar steroid injections -Patient has tried chiropractor, activity modification, PT, home exercises , oral steroids -Recommended core exercises 3-4 times per week (packet provided) and tylenol. Discussed possible L4/5 facet injections but he was not interested at this time -Wound need an A1C of 7.5 or less if we ever consider surgery as a treatment option -Patient should return to office on an as needed basis   Patient expressed understanding of the plan and all questions were answered to the patient's satisfaction.   ___________________________________________________________________________   History:  Patient is a 47 y.o. male who presents today for lumbar spine.  Patient has a 1 year history of low back pain.  He states that he may have injured at work as he works outside and sometimes slipped on ice.  He cannot remember specific instance that preceded the onset of pain.  Pain is felt in his lower lumbar spine.  He does not have any pain radiating into the lower extremities.  Pain is worse as the day goes on.  It does get better if he rests.   Weakness: Denies Symptoms of imbalance: Denies Paresthesias and numbness: Denies Bowel or bladder incontinence: Denies Saddle anesthesia: Denies  Treatments tried: PT, oral steroids, activity modification, chiropractor, home exercises  Review of systems: Denies fevers and chills, night sweats, unexplained weight loss, history of cancer.  Has had pain that wakes him at night  Past medical  history: Hyperlipidemia Hypertension Diabetes (A1c of 12 on 12/05/2022)  Allergies: NKDA  Past surgical history:  Hernia repair  Social history: Denies use of nicotine product (smoking, vaping, patches, smokeless) Alcohol use: Denies Denies recreational drug use   Physical Exam:  General: no acute distress, appears stated age Neurologic: alert, answering questions appropriately, following commands Respiratory: unlabored breathing on room air, symmetric chest rise Psychiatric: appropriate affect, normal cadence to speech   MSK (spine):  -Strength exam      Left  Right EHL    5/5  5/5 TA    5/5  5/5 GSC    5/5  5/5 Knee extension  5/5  5/5 Hip flexion   5/5  5/5  -Sensory exam    Sensation intact to light touch in L3-S1 nerve distributions of bilateral lower extremities  -Achilles DTR: 1/4 on the left, 1/4 on the right -Patellar tendon DTR: 1/4 on the left, 1/4 on the right  -Straight leg raise: Negative bilaterally -Femoral nerve stretch test: Negative bilaterally -Clonus: no beats bilaterally  -Left hip exam: No pain through range of motion, negative Stinchfield, negative FABER, negative SI joint compression test -Right hip exam: No pain through range of motion, negative Stinchfield, negative FABER, negative SI joint compression test  Imaging: XR of the lumbar spine from 12/25/2022 and 02/20/2023 was independently reviewed and interpreted, showing lordotic alignment.  No significant degenerative changes.  No fracture or dislocation.  No evidence of instability on flexion/extension views.  MRI of the lumbar spine from 12/30/2022 was independently reviewed and interpreted, showing facet arthropathy at L4/5.  Disc desiccation at L2/3 and L3/4.  Lateral recess stenosis at L3/4 and L4/5.  No other significant stenosis.  Patient name: Corey Graham Patient MRN: [id number] Date of visit: 02/20/23

## 2023-02-21 ENCOUNTER — Ambulatory Visit: Payer: Self-pay | Admitting: Internal Medicine

## 2023-03-14 ENCOUNTER — Other Ambulatory Visit: Payer: Self-pay

## 2023-03-14 ENCOUNTER — Other Ambulatory Visit: Payer: Self-pay | Admitting: Pharmacist

## 2023-03-14 ENCOUNTER — Encounter: Payer: Self-pay | Admitting: Internal Medicine

## 2023-03-14 ENCOUNTER — Ambulatory Visit: Payer: Self-pay | Attending: Internal Medicine | Admitting: Internal Medicine

## 2023-03-14 ENCOUNTER — Ambulatory Visit (HOSPITAL_BASED_OUTPATIENT_CLINIC_OR_DEPARTMENT_OTHER): Payer: Self-pay | Admitting: Pharmacist

## 2023-03-14 VITALS — BP 150/92 | HR 79

## 2023-03-14 DIAGNOSIS — E113413 Type 2 diabetes mellitus with severe nonproliferative diabetic retinopathy with macular edema, bilateral: Secondary | ICD-10-CM

## 2023-03-14 DIAGNOSIS — Z7189 Other specified counseling: Secondary | ICD-10-CM

## 2023-03-14 DIAGNOSIS — E1121 Type 2 diabetes mellitus with diabetic nephropathy: Secondary | ICD-10-CM

## 2023-03-14 DIAGNOSIS — Z7689 Persons encountering health services in other specified circumstances: Secondary | ICD-10-CM

## 2023-03-14 DIAGNOSIS — I152 Hypertension secondary to endocrine disorders: Secondary | ICD-10-CM

## 2023-03-14 DIAGNOSIS — E785 Hyperlipidemia, unspecified: Secondary | ICD-10-CM

## 2023-03-14 DIAGNOSIS — E1169 Type 2 diabetes mellitus with other specified complication: Secondary | ICD-10-CM

## 2023-03-14 DIAGNOSIS — E1159 Type 2 diabetes mellitus with other circulatory complications: Secondary | ICD-10-CM

## 2023-03-14 LAB — GLUCOSE, POCT (MANUAL RESULT ENTRY): POC Glucose: 200 mg/dl — AB (ref 70–99)

## 2023-03-14 LAB — POCT GLYCOSYLATED HEMOGLOBIN (HGB A1C): HbA1c, POC (controlled diabetic range): 12.7 % — AB (ref 0.0–7.0)

## 2023-03-14 MED ORDER — LANTUS SOLOSTAR 100 UNIT/ML ~~LOC~~ SOPN
10.0000 [IU] | PEN_INJECTOR | Freq: Every day | SUBCUTANEOUS | 11 refills | Status: DC
  Filled 2023-03-14: qty 3, 30d supply, fill #0

## 2023-03-14 MED ORDER — PEN NEEDLES 31G X 8 MM MISC
6 refills | Status: DC
  Filled 2023-03-14: qty 100, 90d supply, fill #0

## 2023-03-14 MED ORDER — EMPAGLIFLOZIN 10 MG PO TABS
10.0000 mg | ORAL_TABLET | Freq: Every day | ORAL | 4 refills | Status: DC
  Filled 2023-03-14: qty 30, 30d supply, fill #0
  Filled 2023-05-21: qty 30, 30d supply, fill #1

## 2023-03-14 MED ORDER — ROSUVASTATIN CALCIUM 20 MG PO TABS
20.0000 mg | ORAL_TABLET | Freq: Every day | ORAL | 1 refills | Status: DC
  Filled 2023-03-14: qty 90, 90d supply, fill #0

## 2023-03-14 MED ORDER — INSULIN GLARGINE-YFGN 100 UNIT/ML ~~LOC~~ SOPN
10.0000 [IU] | PEN_INJECTOR | Freq: Every day | SUBCUTANEOUS | 1 refills | Status: DC
  Filled 2023-03-14: qty 3, 28d supply, fill #0
  Filled 2023-05-09 – 2023-05-21 (×2): qty 3, 28d supply, fill #1

## 2023-03-14 MED ORDER — LOSARTAN POTASSIUM 100 MG PO TABS
100.0000 mg | ORAL_TABLET | Freq: Every day | ORAL | 1 refills | Status: DC
  Filled 2023-03-14: qty 90, 90d supply, fill #0

## 2023-03-14 NOTE — Progress Notes (Signed)
Patient ID: Corey Graham, male    DOB: 1976-03-01  MRN: [id number]  CC: Establish Care (Est care / new pt. Med refills - trulicity/Pt requested a PCP change. Discuss Side effects with flovent./Already received flu vax. No to colonoscopy. )   Subjective: Corey Graham is a 47 y.o. male who presents for new pt visit.  He was followed by NP Juluis Mire.  Decided to change because he was not pleased with care.  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  DM:  Results for orders placed or performed in visit on 03/14/23  POCT glucose (manual entry)  Result Value Ref Range   POC Glucose 200 (A) 70 - 99 mg/dl  POCT glycosylated hemoglobin (Hb A1C)  Result Value Ref Range   Hemoglobin A1C     HbA1c POC (<> result, manual entry)     HbA1c, POC (prediabetic range)     HbA1c, POC (controlled diabetic range) 12.7 (A) 0.0 - 7.0 %  Pt with hx of NPDR with edema and RVO RT; last saw eye doctor 1.5 yrs ago and does not recall the name of provider. Has increasing protein in urine with last level being 1.7 gram 11/2022.  Should be on Trulicity (out x 1.5 mths), Metformin 1 gram BID which he takes intermittently because it causes constipation and Glucotrol 10 mg BID but taking only intermittently because he also feels it causes constipation.    Eating habits poor Does not check BS but has diabetic testing supplies..  Gabapentin on list but pt denies any numbness, burning or pain in extremities.  He tells me that his previous PCP never really told him why he was on it.  I question whether he is on it for chronic back pain issues.  HTN:  taking HCTZ 25 mg, Norvasc 10 mg daily  and Cozaar and took already today.  Limits salt in foods  HL: taking and tolerating Crestor 20 mg daily   Patient Active Problem List   Diagnosis Date Noted   Moderate persistent asthma with acute exacerbation 02/12/2022   Yeast dermatitis of penis 08/30/2021   Uncircumcised male 08/30/2021   Branch retinal vein occlusion of right eye with macular edema 06/11/2019   Nuclear sclerotic cataract of both eyes 06/11/2019   Retinal edema 06/11/2019   Severe nonproliferative diabetic retinopathy of both eyes with macular edema associated with type 2 diabetes mellitus ([place]) 06/11/2019   Essential hypertension 09/09/2017   Low back pain 06/24/2017   Uncontrolled type 2 diabetes mellitus with hyperglycemia, without long-term current use of insulin ([place]) 12/25/2016     Current Outpatient Medications on File Prior to Visit  Medication Sig Dispense Refill   Accu-Chek Softclix Lancets lancets Check blood sugar 3 times daily 100 each 12   albuterol (VENTOLIN HFA) 108 (90 Base) MCG/ACT inhaler Inhale 2 puffs into the lungs every 6 (six) hours as needed for wheezing or shortness of breath. 6.7 g 1   amLODipine (NORVASC) 10 MG tablet Take 1 tablet (10 mg total) by mouth daily. 90 tablet 3   Blood Glucose Monitoring Suppl (ONETOUCH VERIO) w/Device KIT Please use to check blood sugar once daily. 1 kit 0  fluticasone (FLOVENT HFA) 110 MCG/ACT inhaler Inhale 2 puffs into the lungs 2 (two) times daily. 12 g 12   gabapentin (NEURONTIN) 100 MG capsule Take 100 mg by mouth 3 (three) times daily.     glucose blood (ACCU-CHEK GUIDE) test strip Check blood sugars 3 times daily 100 each 12   hydrochlorothiazide (HYDRODIURIL) 25 MG tablet Take 1 tablet (25 mg total) by mouth daily. 90 tablet 3   metFORMIN (GLUCOPHAGE) 1000 MG tablet Take 1 tablet (1,000 mg total) by mouth 2 (two) times daily with a meal. 180 tablet 1   OneTouch Delica Lancets 99991111 MISC Please use to check blood sugar once daily. 100 each 3    pantoprazole (PROTONIX) 40 MG tablet Take 1 tablet (40 mg total) by mouth daily. 30 tablet 3   No current facility-administered medications on file prior to visit.    No Known Allergies  Social History   Socioeconomic History   Marital status: Married    Spouse name: Not on file   Number of children: 3   Years of education: Not on file   Highest education level: Not on file  Occupational History   Not on file  Tobacco Use   Smoking status: Never   Smokeless tobacco: Never  Vaping Use   Vaping Use: Never used  Substance and Sexual Activity   Alcohol use: Yes    Comment: infrequent   Drug use: No   Sexual activity: Yes  Other Topics Concern   Not on file  Social History Narrative   [place] at home with wife and 3 children   Social Determinants of Health   Financial Resource Strain: Not on file  Food Insecurity: Not on file  Transportation Needs: Not on file  Physical Activity: Not on file  Stress: Not on file  Social Connections: Not on file  Intimate Partner Violence: Not on file    Family History  Problem Relation Age of Onset   Diabetes Mother    High blood pressure Father     Past Surgical History:  Procedure Laterality Date   HERNIA REPAIR      ROS: Review of Systems Negative except as stated above  PHYSICAL EXAM: BP (!) 150/92   Pulse 79   SpO2 98%   Physical Exam   General appearance - alert, well appearing, middle-aged Hispanic male and in no distress Mental status - normal mood, behavior, speech, dress, motor activity, and thought processes Neck - supple, no significant adenopathy Chest - clear to auscultation, no wheezes, rales or rhonchi, symmetric air entry Heart - normal rate, regular rhythm, normal S1, S2, no murmurs, rubs, clicks or gallops Extremities - peripheral pulses normal, no pedal edema, no clubbing or cyanosis     Latest Ref Rng & Units 01/24/2023   10:52 PM 12/05/2022   12:00 PM 02/09/2021   10:13 AM  CMP  Glucose 70 - 99  mg/dL 289  222  87   BUN 6 - 20 mg/dL 9  9  11    Creatinine 0.61 - 1.24 mg/dL 0.69  0.60  0.74   Sodium 135 - 145 mmol/L 135  137  140   Potassium 3.5 - 5.1 mmol/L 4.0  4.4  3.8   Chloride 98 - 111 mmol/L 100  99  100   CO2 22 - 32 mmol/L 24  22  24    Calcium 8.9 - 10.3 mg/dL 9.1  9.4  9.6   Total Protein 6.5 - 8.1 g/dL 6.6  7.3  7.7   Total Bilirubin 0.3 - 1.2 mg/dL 0.9  0.3  0.3   Alkaline Phos 38 - 126 U/L 90  136  102   AST 15 - 41 U/L 31  25  31    ALT 0 - 44 U/L 34  30  33    Lipid Panel     Component Value Date/Time   CHOL 258 (H) 12/05/2022 1200   TRIG 208 (H) 12/05/2022 1200   HDL 47 12/05/2022 1200   CHOLHDL 5.5 (H) 12/05/2022 1200   LDLCALC 172 (H) 12/05/2022 1200    CBC    Component Value Date/Time   WBC 8.5 01/24/2023 2252   RBC 4.79 01/24/2023 2252   HGB 14.3 01/24/2023 2252   HGB 16.2 12/05/2022 1200   HCT 42.1 01/24/2023 2252   HCT 48.3 12/05/2022 1200   PLT 196 01/24/2023 2252   PLT 215 12/05/2022 1200   MCV 87.9 01/24/2023 2252   MCV 88 12/05/2022 1200   MCH 29.9 01/24/2023 2252   MCHC 34.0 01/24/2023 2252   RDW 12.1 01/24/2023 2252   RDW 12.5 12/05/2022 1200   LYMPHSABS 3.5 01/24/2023 2252   LYMPHSABS 2.4 12/05/2022 1200   MONOABS 0.5 01/24/2023 2252   EOSABS 0.9 (H) 01/24/2023 2252   EOSABS 0.8 (H) 12/05/2022 1200   BASOSABS 0.1 01/24/2023 2252   BASOSABS 0.2 12/05/2022 1200    ASSESSMENT AND PLAN:  1. Establishing care with new doctor, encounter for   2. 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 Discussed the importance of healthy eating habits, regular aerobic exercise (at least 150 minutes a week as tolerated) and medication compliance to achieve or maintain control of diabetes. -Advised that metformin can cause GI upset usually bloating and diarrhea not so much constipation. Stop glipizide. Continue metformin 1 g twice a day. I will not restart Trulicity given the severity of his  retinopathy Start Lantus insulin 10 units at bedtime.  Went over signs and symptoms of hypoglycemia and how to treat. Start [place].  Avoid during periods of dehydration or acute illness that causes vomiting/diarrhea. Clinical pharmacist met with patient today for insulin teaching. Advised to check blood sugar twice a day before meals with goal being 90-130.  Will have him follow-up with clinical pharmacist in 3 weeks.  Advised to bring readings with him.  Please recheck BMP on that visit given that we have added Jardiance and have increased the dose on Cozaar today. - POCT glucose (manual entry) - POCT glycosylated hemoglobin (Hb 123XX123) - Basic Metabolic Panel - empagliflozin (JARDIANCE) 10 MG TABS tablet; Take 1 tablet (10 mg total) by mouth daily before breakfast.  Dispense: 30 tablet; Refill: 4 - Insulin Pen Needle (PEN NEEDLES) 31G X 8 MM MISC; Use with insulin.  Dispense: 100 each; Refill: 6  3. Severe nonproliferative diabetic retinopathy of both eyes with macular edema associated with type 2 diabetes mellitus ([place]) Advised patient to find out the name of the eye doctor who he was seeing in the past so that we can submit a referral and also get his records.  He states he will come by next week and give the name and address.  4. Hyperlipidemia associated with type 2 diabetes mellitus (HCC) Continue Crestor - rosuvastatin (CRESTOR) 20 MG tablet; Take 1 tablet (20 mg total) by mouth daily.  Dispense: 90 tablet; Refill: 1  5. Hypertension associated with type 2 diabetes mellitus ([place]) Not at goal.  Increase Cozaar to 100 mg daily. -  losartan (COZAAR) 100 MG tablet; Take 1 tablet (100 mg total) by mouth daily.  Dispense: 90 tablet; Refill: 1    Patient was given the opportunity to ask questions.  Patient verbalized understanding of the plan and was able to repeat key elements of the plan.   This documentation was completed using Radio producer.  Any transcriptional  errors are unintentional.  Orders Placed This Encounter  Procedures   Basic Metabolic Panel   POCT glucose (manual entry)   POCT glycosylated hemoglobin (Hb A1C)     Requested Prescriptions   Signed Prescriptions Disp Refills   empagliflozin (JARDIANCE) 10 MG TABS tablet 30 tablet 4    Sig: Take 1 tablet (10 mg total) by mouth daily before breakfast.   losartan (COZAAR) 100 MG tablet 90 tablet 1    Sig: Take 1 tablet (100 mg total) by mouth daily.   rosuvastatin (CRESTOR) 20 MG tablet 90 tablet 1    Sig: Take 1 tablet (20 mg total) by mouth daily.   Insulin Pen Needle (PEN NEEDLES) 31G X 8 MM MISC 100 each 6    Sig: Use with insulin.    Return in about 4 months (around 07/14/2023) for Appt with [facility] in 3 wks for BS check.  Karle Plumber, MD, FACP

## 2023-03-14 NOTE — Progress Notes (Signed)
Patient was educated on the use of the insulin pen. Reviewed necessary supplies and operation of the pen. Also reviewed goal blood glucose levels. Patient was able to demonstrate use. All questions and concerns were addressed.  Time spent face-to-face counseling: 15 minutes  Eddie North, PharmD PGY1 [facility] Resident

## 2023-03-14 NOTE — Patient Instructions (Signed)
Diabetes mellitus y nutricin, en adultos Diabetes Mellitus and Nutrition, Adult Si sufre de diabetes, o diabetes mellitus, es muy importante tener hbitos alimenticios saludables debido a que sus niveles de azcar en la sangre (glucosa) se ven afectados en gran medida por lo que come y bebe. Comer alimentos saludables en las cantidades correctas, aproximadamente a la misma hora todos los das, lo ayudar a: Controlar su glucemia. Disminuir el riesgo de sufrir una enfermedad cardaca. Mejorar la presin arterial. Alcanzar o mantener un peso saludable. Qu puede afectar mi plan de alimentacin? Todas las personas que sufren de diabetes son diferentes y cada una tiene necesidades diferentes en cuanto a un plan de alimentacin. El mdico puede recomendarle que trabaje con un nutricionista para elaborar el mejor plan para usted. Su plan de alimentacin puede variar segn factores como: Las caloras que necesita. Los medicamentos que toma. Su peso. Sus niveles de glucemia, presin arterial y colesterol. Su nivel de actividad. Otras afecciones que tenga, como enfermedades cardacas o renales. Cmo me afectan los carbohidratos? Los carbohidratos, o hidratos de carbono, afectan su nivel de glucemia ms que cualquier otro tipo de alimento. La ingesta de carbohidratos aumenta la cantidad de glucosa en la sangre. Es importante conocer la cantidad de carbohidratos que se pueden ingerir en cada comida sin correr ningn riesgo. Esto es diferente en cada persona. Su nutricionista puede ayudarlo a calcular la cantidad de carbohidratos que debe ingerir en cada comida y en cada refrigerio. Cmo me afecta el alcohol? El alcohol puede provocar una disminucin de la glucemia (hipoglucemia), especialmente si usa insulina o toma determinados medicamentos por va oral para la diabetes. La hipoglucemia es una afeccin potencialmente mortal. Los sntomas de la hipoglucemia, como somnolencia, mareos y confusin, son  similares a los sntomas de haber consumido demasiado alcohol. No beba alcohol si: Su mdico le indica no hacerlo. Est embarazada, puede estar embarazada o est tratando de quedar embarazada. Si bebe alcohol: Limite la cantidad que bebe a lo siguiente: De 0 a 1 medida por da para las mujeres. De 0 a 2 medidas por da para los hombres. Sepa cunta cantidad de alcohol hay en las bebidas que toma. En los [place], una medida equivale a una botella de cerveza de 12 oz (355 ml), un vaso de vino de 5 oz (148 ml) o un vaso de una bebida alcohlica de alta graduacin de 1 oz (44 ml). Mantngase hidratado bebiendo agua, refrescos dietticos o t helado sin azcar. Tenga en cuenta que los refrescos comunes, los jugos y otras bebidas para mezclar pueden contener mucha azcar y se deben contar como carbohidratos. Consejos para seguir este plan  Leer las etiquetas de los alimentos Comience por leer el tamao de la porcin en la etiqueta de Informacin nutricional de los alimentos envasados y las bebidas. La cantidad de caloras, carbohidratos, grasas y otros nutrientes detallados en la etiqueta se basan en una porcin del alimento. Muchos alimentos contienen ms de una porcin por envase. Verifique la cantidad total de gramos (g) de carbohidratos totales en una porcin. Verifique la cantidad de gramos de grasas saturadas y grasas trans en una porcin. Escoja alimentos que no contengan estas grasas o que su contenido de estas sea bajo. Verifique la cantidad de miligramos (mg) de sal (sodio) en una porcin. La mayora de las personas deben limitar la ingesta de sodio total a menos de 2300 mg por da. Siempre consulte la informacin nutricional de los alimentos etiquetados como "con bajo contenido de grasa" o "sin grasa".   Estos alimentos pueden tener un mayor contenido de azcar agregada o carbohidratos refinados, y deben evitarse. Hable con su nutricionista para identificar sus objetivos diarios en  cuanto a los nutrientes mencionados en la etiqueta. Al ir de compras Evite comprar alimentos procesados, enlatados o precocidos. Estos alimentos tienden a tener una mayor cantidad de grasa, sodio y azcar agregada. Compre en la zona exterior de la tienda de comestibles. Esta es la zona donde se encuentran con mayor frecuencia las frutas y las verduras frescas, los cereales a granel, las carnes frescas y los productos lcteos frescos. Al cocinar Use mtodos de coccin a baja temperatura, como hornear, en lugar de mtodos de coccin a alta temperatura, como frer en abundante aceite. Cocine con aceites saludables, como el aceite de oliva, canola o girasol. Evite cocinar con manteca, crema o carnes con alto contenido de grasa. Planificacin de las comidas Coma las comidas y los refrigerios regularmente, preferentemente a la misma hora todos los das. Evite pasar largos perodos de tiempo sin comer. Consuma alimentos ricos en fibra, como frutas frescas, verduras, frijoles y cereales integrales. Consuma entre 4 y 6 onzas (entre 112 y 168 g) de protenas magras por da, como carnes magras, pollo, pescado, huevos o tofu. Una onza (oz) (28 g) de protena magra equivale a: 1 onza (28 g) de carne, pollo o pescado. 1 huevo.  taza (62 g) de tofu. Coma algunos alimentos por da que contengan grasas saludables, como aguacates, frutos secos, semillas y pescado. Qu alimentos debo comer? Frutas Bayas. Manzanas. Naranjas. Duraznos. Damascos. Ciruelas. Uvas. Mangos. Papayas. Granadas. Kiwi. Cerezas. Verduras Verduras de hoja verde, que incluyen lechuga, espinaca, col rizada, acelga, hojas de berza, hojas de mostaza y repollo. Remolachas. Coliflor. Brcoli. Zanahorias. Judas verdes. Tomates. Pimientos. Cebollas. Pepinos. Coles de Bruselas. Granos Granos integrales, como panes, galletas, tortillas, cereales y pastas de salvado o integrales. Avena sin azcar. Quinua. Arroz integral o salvaje. Carnes y otras  protenas Frutos de mar. Carne de ave sin piel. Cortes magros de ave y carne de res. Tofu. Frutos secos. Semillas. Lcteos Productos lcteos sin grasa o con bajo contenido de grasa, como leche, yogur y queso. Es posible que los productos detallados arriba no constituyan una lista completa de los alimentos y las bebidas que puede tomar. Consulte a un nutricionista para obtener ms informacin. Qu alimentos debo evitar? Frutas Frutas enlatadas al almbar. Verduras Verduras enlatadas. Verduras congeladas con mantequilla o salsa de crema. Granos Productos elaborados con harina y harina blanca refinada, como panes, pastas, bocadillos y cereales. Evite todos los alimentos procesados. Carnes y otras protenas Cortes de carne con alto contenido de grasa. Carne de ave con piel. Carnes empanizadas o fritas. Carne procesada. Evite las grasas saturadas. Lcteos Yogur, queso o leche enteros. Bebidas Bebidas azucaradas, como gaseosas o t helado. Es posible que los productos que se enumeran ms arriba no constituyan una lista completa de los alimentos y las bebidas que debe evitar. Consulte a un nutricionista para obtener ms informacin. Preguntas para hacerle al mdico Debo consultar con un especialista certificado en atencin y educacin sobre la diabetes? Es necesario que me rena con un nutricionista? A qu nmero puedo llamar si tengo preguntas? Cules son los mejores momentos para controlar la glucemia? Dnde encontrar ms informacin: American Diabetes Association (Asociacin Estadounidense de la Diabetes): diabetes.org Academy of Nutrition and Dietetics (Academia de Nutricin y Diettica): eatright.org National Institute of Diabetes and Digestive and Kidney Diseases (Instituto Nacional de la Diabetes y las Enfermedades Digestivas y Renales): niddk.nih.gov Association of Diabetes   Care & Education Specialists (Asociacin de Especialistas en Atencin y Educacin sobre la Diabetes):  diabeteseducator.org Resumen Es importante tener hbitos alimenticios saludables debido a que sus niveles de azcar en la sangre (glucosa) se ven afectados en gran medida por lo que come y bebe. Es importante consumir alcohol con prudencia. Un plan de comidas saludable lo ayudar a controlar la glucosa en sangre y a reducir el riesgo de enfermedades cardacas. El mdico puede recomendarle que trabaje con un nutricionista para elaborar el mejor plan para usted. Esta informacin no tiene como fin reemplazar el consejo del mdico. Asegrese de hacerle al mdico cualquier pregunta que tenga. Document Revised: 08/10/2020 Document Reviewed: 08/10/2020 Elsevier Patient Education  2023 Elsevier Inc.  

## 2023-03-15 LAB — BASIC METABOLIC PANEL
BUN/Creatinine Ratio: 14 (ref 9–20)
BUN: 10 mg/dL (ref 6–24)
CO2: 25 mmol/L (ref 20–29)
Calcium: 9.9 mg/dL (ref 8.7–10.2)
Chloride: 97 mmol/L (ref 96–106)
Creatinine, Ser: 0.69 mg/dL — ABNORMAL LOW (ref 0.76–1.27)
Glucose: 175 mg/dL — ABNORMAL HIGH (ref 70–99)
Potassium: 4 mmol/L (ref 3.5–5.2)
Sodium: 136 mmol/L (ref 134–144)
eGFR: 116 mL/min/{1.73_m2} (ref >59)

## 2023-03-26 ENCOUNTER — Ambulatory Visit: Payer: Self-pay | Admitting: Pharmacist

## 2023-03-26 ENCOUNTER — Telehealth: Payer: Self-pay

## 2023-03-26 DIAGNOSIS — E1159 Type 2 diabetes mellitus with other circulatory complications: Secondary | ICD-10-CM

## 2023-03-26 NOTE — Telephone Encounter (Signed)
Pt came in this morning saying the medication Losartan Potassium 100 Mg making him sick and his blood pressure was going up. Pt is wanting if possible to lower medication or be given a new one

## 2023-03-27 ENCOUNTER — Other Ambulatory Visit: Payer: Self-pay

## 2023-03-27 MED ORDER — LOSARTAN POTASSIUM 50 MG PO TABS
50.0000 mg | ORAL_TABLET | Freq: Every day | ORAL | 1 refills | Status: DC
  Filled 2023-03-27: qty 90, 90d supply, fill #0

## 2023-03-27 MED ORDER — CARVEDILOL 3.125 MG PO TABS
3.1250 mg | ORAL_TABLET | Freq: Two times a day (BID) | ORAL | 3 refills | Status: DC
  Filled 2023-03-27 – 2023-04-09 (×2): qty 60, 30d supply, fill #0

## 2023-03-27 NOTE — Telephone Encounter (Signed)
Called & spoke to the patient. Verified name & DOB. Informed to continue taking the 50 mg of Losartan. Informed patient that another medication called carvedilol 0.125 mg twice a day has been sent to the pharmacy. Patient expressed verbal understanding.  Patient would like to know what medications he is supposed to be taking. Stated that interpreter did not fully interpret everything discussed at appointment and is still unsure what medications to continue and which ones to discontinue. Please advise.

## 2023-03-27 NOTE — Addendum Note (Signed)
Addended by: Jonah Blue B on: 03/27/2023 02:47 PM   Modules accepted: Orders

## 2023-04-01 NOTE — Telephone Encounter (Signed)
Called & spoke to the patient. Verified name & DOB. Informed that the discharge summary with meds listed should have what he should be taking. Informed patient. That he will also be seeing Franky Macho later this month for further clarification. Patient expressed verbal understanding. No further questions at this time

## 2023-04-03 ENCOUNTER — Other Ambulatory Visit: Payer: Self-pay

## 2023-04-09 ENCOUNTER — Other Ambulatory Visit: Payer: Self-pay

## 2023-04-09 ENCOUNTER — Ambulatory Visit: Payer: Self-pay | Attending: Internal Medicine | Admitting: Pharmacist

## 2023-04-09 ENCOUNTER — Encounter: Payer: Self-pay | Admitting: Pharmacist

## 2023-04-09 VITALS — BP 127/79 | HR 81

## 2023-04-09 DIAGNOSIS — I152 Hypertension secondary to endocrine disorders: Secondary | ICD-10-CM

## 2023-04-09 DIAGNOSIS — E1121 Type 2 diabetes mellitus with diabetic nephropathy: Secondary | ICD-10-CM

## 2023-04-09 DIAGNOSIS — E1159 Type 2 diabetes mellitus with other circulatory complications: Secondary | ICD-10-CM

## 2023-04-09 NOTE — Progress Notes (Signed)
S:     No chief complaint on file.  46 y.o. male who p76resents for diabetes evaluation, education, and management.  PMH is significant for HTN and T2DM.   Patient was referred and last seen by Primary Care Provider, Dr. Laural Benes, on 03/14/2023. At last visit, A1c was 12.7% increased from 12% and patient reported being out of Trulicity and not taking metformin or glipizide regularly due to constipation. Dr. Laural Benes restarted metformin and started Jardiance and Semglee 10 units daily. Patient called the office on 03/26/2023 and reported  feeling fatigued and carvedilol 3.125 mg BID was added to his regimen.   Today, patient arrives in good spirits and presents without any assistance using interpreter Para March [phone] and Trula Ore #[id number]. Patient did not bring in medications today. Patient hasn't started insulin due to concern of leaving it out of the fridge. Also, reports not starting carvedilol as he had not picked it up yet. 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   Current diabetes medications include: Jardiance 10 mg daily, Semglee 10 units daily (not started), metformin 1000 mg BID (taking once a day) Current hypertension medications include: amlodipine 10 mg daily, carvedilol 3.125 mg BID (not started), HCTZ 25 mg daily, losartan 50 mg daily Current hyperlipidemia medications include: rosuvastatin 20 mg  Patient reports adherence to taking all medications as prescribed. Except taking metformin daily and has not started carvedilol or Semglee.   Insurance coverage: self-pay  Reported home fasting blood sugars: not taking. Has meter at home.  Patient denies nocturia (nighttime urination).  Patient denies neuropathy (nerve pain). Toe on left side swelled once a few weeks ago. Better with loose socks Patient reports  visual changes. When looking at cell phone for extended periods of time.  Patient reports self foot exams.   Reports no dietary changes   O:  Vitals:   04/09/23 1007  BP: 127/79  Pulse: 81   Lab Results  Component Value Date   HGBA1C 12.7 (A) 03/14/2023   Vitals:   04/09/23 1007  BP: 127/79  Pulse: 81    Lipid Panel     Component Value Date/Time   CHOL 258 (H) 12/05/2022 1200   TRIG 208 (H) 12/05/2022 1200   HDL 47 12/05/2022 1200   CHOLHDL 5.5 (H) 12/05/2022 1200   LDLCALC 172 (H) 12/05/2022 1200    Clinical Atherosclerotic Cardiovascular Disease (ASCVD): No  The 10-year ASCVD risk score (Arnett DK, et al., 2019) is: 7.4%   Values used to calculate the score:     Age: 49 years     Sex: Male     Is Non-Hispanic African American: No     Diabetic: Yes     Tobacco smoker: No     Systolic Blood Pressure: 127 mmHg     Is BP treated: Yes     HDL Cholesterol: 47 mg/dL     Total Cholesterol: 258 mg/dL   A/P: Diabetes longstanding currently uncontrolled. Patient is  able to verbalize appropriate hypoglycemia management plan. Medication adherence appears appropriate, except has not started insulin due to question about storage. Control is suboptimal due to not starting insulin. -Encouraged patient to start basal insulin Semglee (insulin glargine) 10 units every evening.  -Continued SGLT2-I Jardiance (empagliflozin) 10 mg. Counseled on sick day rules. -Continued metformin 1000 mg BID.  -Patient educated on purpose,  proper use, and potential adverse effects of insulin.  -Encouraged patient to bring all medications to next visit. -Discussed starting to take home blood glucose and bring readings to next visit. -Extensively discussed pathophysiology of diabetes, recommended lifestyle interventions, dietary effects on blood sugar control.  -Counseled on s/sx of and management of hypoglycemia.  -Next A1c anticipated July 2024.   Hypertension longstanding currently controlled.  Blood pressure goal of <130/80 mmHg. Medication adherence appropriate. Has not started carvedilol yet. -Start taking prescribed carvedilol 3.125 mg BID. -Continued losartan 50 mg daily. -Continued amlodipine 10 mg daily.  -Continued HCTZ 25 mg daily.  -Encouraged patient to bring all medications to next visit.  -F/u labs - BMP  Written patient instructions provided. Patient verbalized understanding of treatment plan.  Total time in face to face counseling 40 minutes.    Follow-up:  Pharmacist 1 month. PCP clinic visit in July 2024.   Georga Hacking, Pharm.D PGY1 Pharmacy Resident 04/09/2023 11:52 AM

## 2023-04-10 ENCOUNTER — Other Ambulatory Visit: Payer: Self-pay | Admitting: Internal Medicine

## 2023-04-10 ENCOUNTER — Other Ambulatory Visit: Payer: Self-pay

## 2023-04-10 LAB — BMP8+EGFR
BUN/Creatinine Ratio: 21 — ABNORMAL HIGH (ref 9–20)
BUN: 13 mg/dL (ref 6–24)
CO2: 19 mmol/L — ABNORMAL LOW (ref 20–29)
Calcium: 9.5 mg/dL (ref 8.7–10.2)
Chloride: 101 mmol/L (ref 96–106)
Creatinine, Ser: 0.63 mg/dL — ABNORMAL LOW (ref 0.76–1.27)
Glucose: 292 mg/dL — ABNORMAL HIGH (ref 70–99)
Potassium: 4.2 mmol/L (ref 3.5–5.2)
Sodium: 137 mmol/L (ref 134–144)
eGFR: 120 mL/min/{1.73_m2} (ref >59)

## 2023-04-10 NOTE — Progress Notes (Signed)
Kidney function and potassium level are okay on current medications. Blood pressure on visit with clinical pharmacist yesterday was good.  He can hold off on starting the new blood pressure medicine carvedilol. Blood sugar is quite elevated.  Should start the insulin 10 units daily as prescribed.

## 2023-04-11 ENCOUNTER — Other Ambulatory Visit: Payer: Self-pay

## 2023-04-15 ENCOUNTER — Other Ambulatory Visit: Payer: Self-pay

## 2023-04-15 ENCOUNTER — Ambulatory Visit: Payer: Self-pay | Attending: Internal Medicine | Admitting: Pharmacist

## 2023-04-15 DIAGNOSIS — E1165 Type 2 diabetes mellitus with hyperglycemia: Secondary | ICD-10-CM

## 2023-04-15 NOTE — Progress Notes (Signed)
Patient was educated on the use of the OneTouch Verio blood glucose meter. Reviewed necessary supplies and operation of the meter. Also reviewed goal blood glucose levels. Patient was able to demonstrate use. All questions and concerns were addressed. Refills sent for lancets.   Patient also instructed on how to use Semglee insulin pen.   Time spent with patient: 15 minutes  Valeda Malm, Pharm.D. PGY-2 Ambulatory Care Pharmacy Resident

## 2023-04-23 ENCOUNTER — Other Ambulatory Visit: Payer: Self-pay

## 2023-04-23 MED ORDER — IBUPROFEN 400 MG PO TABS
400.0000 mg | ORAL_TABLET | ORAL | 0 refills | Status: DC
  Filled 2023-04-23: qty 30, 5d supply, fill #0

## 2023-04-23 MED ORDER — AMOXICILLIN 500 MG PO CAPS
500.0000 mg | ORAL_CAPSULE | Freq: Three times a day (TID) | ORAL | 0 refills | Status: AC
  Filled 2023-04-23: qty 21, 7d supply, fill #0

## 2023-04-23 MED ORDER — CHLORHEXIDINE GLUCONATE 0.12 % MT SOLN
OROMUCOSAL | 0 refills | Status: DC
  Filled 2023-04-23: qty 473, 15d supply, fill #0

## 2023-04-24 ENCOUNTER — Other Ambulatory Visit: Payer: Self-pay

## 2023-04-24 ENCOUNTER — Other Ambulatory Visit (HOSPITAL_COMMUNITY): Payer: Self-pay

## 2023-04-29 ENCOUNTER — Other Ambulatory Visit: Payer: Self-pay

## 2023-05-09 ENCOUNTER — Other Ambulatory Visit: Payer: Self-pay

## 2023-05-09 ENCOUNTER — Other Ambulatory Visit: Payer: Self-pay | Admitting: Critical Care Medicine

## 2023-05-09 MED ORDER — PANTOPRAZOLE SODIUM 40 MG PO TBEC
40.0000 mg | DELAYED_RELEASE_TABLET | Freq: Every day | ORAL | 0 refills | Status: DC
  Filled 2023-05-09: qty 90, 90d supply, fill #0
  Filled 2023-05-21: qty 30, 30d supply, fill #0

## 2023-05-10 ENCOUNTER — Ambulatory Visit: Payer: Self-pay | Admitting: Pharmacist

## 2023-05-15 ENCOUNTER — Other Ambulatory Visit: Payer: Self-pay

## 2023-05-16 ENCOUNTER — Other Ambulatory Visit: Payer: Self-pay

## 2023-05-20 NOTE — Progress Notes (Unsigned)
S:     No chief complaint on file.  47 y.o. male who presents for diabetes evaluation, education, and management.  PMH is significant for T2DM, HTN, HLD, diabetic retinopathy, and asthma  Patient was referred and last seen by Primary Care Provider, Dr. Jonah Blue, on 03/14/2023.  *** Patient was referred by Dr. Jonah Blue on 03/14/23. Patient was last seen by Primary Care Provider, Dr. ***, on ***.  At last visit, Dr. Laural Benes discontinued carvedilol 3.125 mg daily due to BP at 127/79 which is at goal of less than 130/80. Blood sugar was elevated at 292, started semglee (insulin glargine-yfgn) at 10 units daily. Current A1c from 03/14/2023 is 12.7%.  Today, patient arrives in *** good spirits and presents without *** any assistance. ***  Patient reports Diabetes was diagnosed in 2012.   Family/Social History: mother - diabetes, father - high blood pressure; low physical activity  Current diabetes medications include: jardiance 10 mg daily, semglee 10 units every evening, metformin 1000 mg BID Current hypertension medications include: losartan 50 mg daily, HCTZ 25 mg daily, amlodipine 10 mg daily Current hyperlipidemia medications include: rosuvastatin 20 mg daily   Patient reports adherence to taking all medications as prescribed.  *** Patient denies adherence with medications, reports missing *** medications *** times per week, on average.  Do you feel that your medications are working for you? {YES NO:22349} Have you been experiencing any side effects to the medications prescribed? {YES NO:22349} Do you have any problems obtaining medications due to transportation or finances? {YES [id number] Insurance coverage: self-pay  Patient {Actions; denies-reports:120008} hypoglycemic events.  Reported home fasting blood sugars: ***  Reported 2 hour post-meal/random blood sugars: ***.  Patient {Actions; denies-reports:120008} nocturia (nighttime urination).  Patient {Actions;  denies-reports:120008} neuropathy (nerve pain). Patient {Actions; denies-reports:120008} visual changes. Patient {Actions; denies-reports:120008} self foot exams.   Patient reported dietary habits: Eats *** meals/day Breakfast: *** Lunch: *** Dinner: *** Snacks: *** Drinks: ***  Within the past 12 months, did you worry whether your food would run out before you got money to buy more? {YES NO:22349} Within the past 12 months, did the food you bought run out, and you didn't have money to get more? {YES NO:22349} PHQ-9 Score: ***  Patient-reported exercise habits: ***   O:   ROS  Physical Exam  7 day average blood glucose: ***  *** CGM Download:  % Time CGM is active: ***% Average Glucose: *** mg/dL Glucose Management Indicator: ***  Glucose Variability: *** (goal <36%) Time in Goal:  - Time in range 70-180: ***% - Time above range: ***% - Time below range: ***% Observed patterns:   Lab Results  Component Value Date   HGBA1C 12.7 (A) 03/14/2023   There were no vitals filed for this visit.  Lipid Panel     Component Value Date/Time   CHOL 258 (H) 12/05/2022 1200   TRIG 208 (H) 12/05/2022 1200   HDL 47 12/05/2022 1200   CHOLHDL 5.5 (H) 12/05/2022 1200   LDLCALC 172 (H) 12/05/2022 1200    Clinical Atherosclerotic Cardiovascular Disease (ASCVD): {YES/NO:21197} The 10-year ASCVD risk score (Arnett DK, et al., 2019) is: 8.1%   Values used to calculate the score:     Age: 77 years     Sex: Male     Is Non-Hispanic African American: No     Diabetic: Yes     Tobacco smoker: No     Systolic Blood Pressure: 127 mmHg     Is BP  treated: Yes     HDL Cholesterol: 47 mg/dL     Total Cholesterol: 258 mg/dL   Patient is participating in a Managed Medicaid Plan:  {MM YES/NO:27447::"Yes"}   A/P: Diabetes longstanding *** currently ***. Patient is *** able to verbalize appropriate hypoglycemia management plan. Medication adherence appears ***. Control is suboptimal due  to ***. -{Meds adjust:18428} basal insulin *** Lantus/Basaglar/Semglee (insulin glargine) *** Tresiba (insulin degludec) from *** units to *** units daily in the morning. Patient will continue to titrate 1 unit every *** days if fasting blood sugar > 100mg /dl until fasting blood sugars reach goal or next visit.  -{Meds adjust:18428} rapid insulin *** Novolog (insulin aspart) *** Humalog (insulin lispro) from *** to ***.  -{Meds adjust:18428} GLP-1 *** Trulicity (dulaglutide) *** Ozempic (semaglutide) *** Mounjaro (tirzepatide) from *** mg to *** mg .  -{Meds adjust:18428} SGLT2-I *** Farxiga (dapagliflozin) *** Jardiance (empagliflozin) 10 mg. Counseled on sick day rules. -{Meds adjust:18428} metformin ***.  -Patient educated on purpose, proper use, and potential adverse effects of ***.  -Extensively discussed pathophysiology of diabetes, recommended lifestyle interventions, dietary effects on blood sugar control.  -Counseled on s/sx of and management of hypoglycemia.  -Next A1c anticipated ***.   ASCVD risk - primary ***secondary prevention in patient with diabetes. Last LDL is *** not at goal of <16 *** mg/dL. ASCVD risk factors include *** and 10-year ASCVD risk score of ***. {Desc; low/moderate/high:110033} intensity statin indicated.  -{Meds adjust:18428} ***statin *** mg.   Hypertension longstanding *** currently ***. Blood pressure goal of <130/80 *** mmHg. Medication adherence ***. Blood pressure control is suboptimal due to ***. -{Meds adjust:18428} *** mg.  Written patient instructions provided. Patient verbalized understanding of treatment plan.  Total time in face to face counseling *** minutes.    Follow-up:  Pharmacist ***. PCP clinic visit in ***.  Patient seen with ***

## 2023-05-21 ENCOUNTER — Other Ambulatory Visit (INDEPENDENT_AMBULATORY_CARE_PROVIDER_SITE_OTHER): Payer: Self-pay | Admitting: Primary Care

## 2023-05-21 ENCOUNTER — Ambulatory Visit: Payer: Self-pay | Admitting: Pharmacist

## 2023-05-21 ENCOUNTER — Other Ambulatory Visit: Payer: Self-pay

## 2023-05-21 MED ORDER — ALBUTEROL SULFATE HFA 108 (90 BASE) MCG/ACT IN AERS
2.0000 | INHALATION_SPRAY | Freq: Four times a day (QID) | RESPIRATORY_TRACT | 1 refills | Status: DC | PRN
  Filled 2023-05-21: qty 6.7, 25d supply, fill #0

## 2023-05-21 NOTE — Telephone Encounter (Signed)
Requested medications are due for refill today.  yes  Requested medications are on the active medications list.  yes  Last refill. 12/05/2022 6.7g 1 rf  Future visit scheduled.   yes  Notes to clinic.  Rx signed by Gwinda Passe    Requested Prescriptions  Pending Prescriptions Disp Refills   albuterol (VENTOLIN HFA) 108 (90 Base) MCG/ACT inhaler 6.7 g 1    Sig: Inhale 2 puffs into the lungs every 6 (six) hours as needed for wheezing or shortness of breath.     Pulmonology:  Beta Agonists 2 Passed - 05/21/2023 10:28 AM      Passed - Last BP in normal range    BP Readings from Last 1 Encounters:  04/09/23 127/79         Passed - Last Heart Rate in normal range    Pulse Readings from Last 1 Encounters:  04/09/23 81         Passed - Valid encounter within last 12 months    Recent Outpatient Visits           1 month ago Uncontrolled type 2 diabetes mellitus with hyperglycemia, without long-term current use of insulin (HCC)   [place] [facility] [place], Ulysses L, RPH-CPP   1 month ago Hypertension associated with type 2 diabetes mellitus [facility])   [place] [facility] [place], [place] L, RPH-CPP   2 months ago 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 Marcine Matar, MD   3 months ago Essential hypertension   [place] Drucilla Chalet, RPH-CPP       Future Appointments             In 3 weeks Lois Huxley, Cornelius Moras, RPH-CPP [facility]   In 1 month Laural Benes, Binnie Rail, MD [facility]

## 2023-05-22 ENCOUNTER — Other Ambulatory Visit: Payer: Self-pay

## 2023-05-30 ENCOUNTER — Ambulatory Visit: Payer: Self-pay

## 2023-06-06 ENCOUNTER — Ambulatory Visit: Payer: Self-pay

## 2023-06-11 ENCOUNTER — Ambulatory Visit: Payer: Self-pay | Attending: Internal Medicine | Admitting: Pharmacist

## 2023-06-11 ENCOUNTER — Encounter: Payer: Self-pay | Admitting: Pharmacist

## 2023-06-11 ENCOUNTER — Other Ambulatory Visit: Payer: Self-pay

## 2023-06-11 DIAGNOSIS — Z91148 Patient's other noncompliance with medication regimen for other reason: Secondary | ICD-10-CM

## 2023-06-11 DIAGNOSIS — Z7984 Long term (current) use of oral hypoglycemic drugs: Secondary | ICD-10-CM

## 2023-06-11 DIAGNOSIS — E1165 Type 2 diabetes mellitus with hyperglycemia: Secondary | ICD-10-CM

## 2023-06-11 LAB — POCT GLYCOSYLATED HEMOGLOBIN (HGB A1C): HbA1c, POC (controlled diabetic range): 12.5 % — AB (ref 0.0–7.0)

## 2023-06-11 MED ORDER — INSULIN GLARGINE-YFGN 100 UNIT/ML ~~LOC~~ SOPN
20.0000 [IU] | PEN_INJECTOR | Freq: Every day | SUBCUTANEOUS | 1 refills | Status: DC
  Filled 2023-06-11: qty 15, 75d supply, fill #0

## 2023-06-11 NOTE — Progress Notes (Signed)
S:     No chief complaint on file.  47 y.o. male who presents for diabetes evaluation, education, and management.  PMH is significant for T2DM, HTN, HLD, diabetic retinopathy, and asthma.   Patient was referred and last seen by Primary Care Provider, Dr. Laural Benes, on 03/14/2023. A1c was 12.7% at that visit (up from 12.0% in December). Patient admitted to non-adherence to Trulicty and glipizide. Patient was taking metfomin intermittently due to constipation. Dr. Laural Benes started Semglee 10 units daily and Jardiance 10 mg daily. Restarted metformin 1000 mg BID. Patient seen by pharmacy clinic on 04/09/2023, where medication adherence was encouraged.  Today, patient arrives in positive spirits and presents without any assistance. Spanish interpreter Harrison Mons [phone] assist with visit. Patient reports not taking any of his prescribed oral medications for the past week due to GI upset (loose BM) and abdominal pain since last week. Endorses adherence with Semglee 10 units daily. Reports he has never taken or heard of Jardiance, however, according to medication fill history this was filled on 05/24/2023. Still not checking his blood sugars.  Current diabetes medications include: Jardiance 10 mg daily (not taking), Semglee 10 units daily, metformin 1000 mg BID (was taking once a day) Current hypertension medications include: losartan 50 mg daily, amlodipine 10 mg daily, hydrochlorothiazide 25 mg daily Current hyperlipidemia medications include: rosuvastatin 20 mg daily  Patient denies hypoglycemic events.  Patient denies nocturia (nighttime urination).  Patient denies neuropathy (nerve pain). Patient denies visual changes. Patient reports self foot exams.   Family/Social History:  - Fhx: DM, HTN   Insurance coverage: Self pay  O:   ROS  Physical Exam  7 day average blood glucose: Not checking blood sugars  Lab Results  Component Value Date   HGBA1C 12.5 (A) 06/11/2023   There were no  vitals filed for this visit.  Lipid Panel     Component Value Date/Time   CHOL 258 (H) 12/05/2022 1200   TRIG 208 (H) 12/05/2022 1200   HDL 47 12/05/2022 1200   CHOLHDL 5.5 (H) 12/05/2022 1200   LDLCALC 172 (H) 12/05/2022 1200    Clinical Atherosclerotic Cardiovascular Disease (ASCVD): No  The 10-year ASCVD risk score (Arnett DK, et al., 2019) is: 8.1%   Values used to calculate the score:     Age: 19 years     Sex: Male     Is Non-Hispanic African American: No     Diabetic: Yes     Tobacco smoker: No     Systolic Blood Pressure: 127 mmHg     Is BP treated: Yes     HDL Cholesterol: 47 mg/dL     Total Cholesterol: 258 mg/dL   Patient is participating in a Managed Medicaid Plan: No  A/P: Diabetes longstanding currently uncontrolled, based on A1c. Patient is able to verbalize appropriate hypoglycemia management plan. Medication adherence appears suboptimal. Control is suboptimal due to non adherence with medications. There is a probable need for meal time insulin but will see how patient complies with increase of long acting insulin. Could also consider GLP-1 at next visit, did not start today due to presence of abdominal pain. Encouraged patient to start checking his blood sugars at home.  -Discontinued Jardiance 10 mg daily and metformin 1000 mg BID as he was not taking anyway. -Increased dose of Semglee 20 units daily. -Patient educated on purpose, proper use, and potential adverse effects of Semglee.  -Extensively discussed pathophysiology of diabetes, recommended lifestyle interventions, dietary effects on blood sugar control.  -  Counseled on s/sx of and management of hypoglycemia.  -Next A1c anticipated September 2024.   ASCVD risk - primary prevention in patient with diabetes. Last LDL is 172 not at goal of <47 mg/dL. ASCVD risk factors include DM, HTN and 10-year ASCVD risk score of 8.1%. high intensity statin indicated.  -Continued rosuvastatin 20 mg daily.   Hypertension  longstanding currently uncontrolled, due to patient not taking medications. Blood pressure goal of <130/80 mmHg. Medication adherence suboptimal. Blood pressure control is suboptimal due to nonadherent. -Continued losartan 50 mg daily, amlodipine 10 mg daily, hydrochlorothiazide 25 mg daily mg. (Restart meds today)  Written patient instructions provided. Patient verbalized understanding of treatment plan.  Total time in face to face counseling 30 minutes.    Follow-up:  Pharmacist in 2 months. PCP clinic visit 07/16/23.   Patient seen with  Alesia Banda, PharmD Candidate [employer] 20[employer]  Butch Penny, PharmD, [place], CPP Clinical Pharmacist [facility] [phone]

## 2023-06-12 ENCOUNTER — Other Ambulatory Visit: Payer: Self-pay

## 2023-06-13 ENCOUNTER — Ambulatory Visit: Payer: Self-pay

## 2023-06-18 ENCOUNTER — Other Ambulatory Visit: Payer: Self-pay

## 2023-06-19 ENCOUNTER — Ambulatory Visit: Payer: Self-pay | Attending: Internal Medicine

## 2023-06-19 ENCOUNTER — Telehealth: Payer: Self-pay

## 2023-06-19 NOTE — Telephone Encounter (Signed)
Called & spoke to the patient. Verified name & DOB. Scheduled appointment for 06/21/2023 at 9:10 a.m. Patient confirmed appointment. FYI

## 2023-06-19 NOTE — Telephone Encounter (Signed)
Copied from CRM [id number]. Topic: General - Other >> Jun 19, 2023 11:12 AM Macon Large wrote: Reason for CRM: Tonya with Dr. Rayfield Citizen office called for an update on medical clearance that was faxed on 06/11/23. Cb# [phone]

## 2023-06-21 ENCOUNTER — Ambulatory Visit: Payer: Self-pay | Attending: Internal Medicine | Admitting: Internal Medicine

## 2023-06-21 ENCOUNTER — Encounter: Payer: Self-pay | Admitting: Internal Medicine

## 2023-06-21 VITALS — BP 120/82 | HR 77 | Temp 97.7°F | Ht 64.0 in | Wt 162.0 lb

## 2023-06-21 DIAGNOSIS — Z01818 Encounter for other preprocedural examination: Secondary | ICD-10-CM

## 2023-06-21 DIAGNOSIS — Z794 Long term (current) use of insulin: Secondary | ICD-10-CM

## 2023-06-21 DIAGNOSIS — I152 Hypertension secondary to endocrine disorders: Secondary | ICD-10-CM

## 2023-06-21 DIAGNOSIS — E1169 Type 2 diabetes mellitus with other specified complication: Secondary | ICD-10-CM

## 2023-06-21 DIAGNOSIS — Z1211 Encounter for screening for malignant neoplasm of colon: Secondary | ICD-10-CM

## 2023-06-21 DIAGNOSIS — E785 Hyperlipidemia, unspecified: Secondary | ICD-10-CM

## 2023-06-21 DIAGNOSIS — E1165 Type 2 diabetes mellitus with hyperglycemia: Secondary | ICD-10-CM

## 2023-06-21 DIAGNOSIS — E1159 Type 2 diabetes mellitus with other circulatory complications: Secondary | ICD-10-CM

## 2023-06-21 LAB — GLUCOSE, POCT (MANUAL RESULT ENTRY): POC Glucose: 246 mg/dl — AB (ref 70–99)

## 2023-06-21 MED ORDER — METFORMIN HCL 1000 MG PO TABS: 500.0000 mg | ORAL_TABLET | Freq: Two times a day (BID) | ORAL | 1 refills | Status: DC

## 2023-06-21 NOTE — Progress Notes (Signed)
Patient ID: Corey Graham, male    DOB: 1976-01-17  MRN: [id number]  CC: Pre-op Exam (Pre-op exam. )   Subjective: Corey Graham is a 47 y.o. 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 # Maryclare Bean [id number] Haring [phone]  Pt did not bring meds with him.  Need clearance for excisional bx of the maxilla (#6-8) by the [facility] [phone])  DM: Results for orders placed or performed in visit on 06/21/23  POCT glucose (manual entry)  Result Value Ref Range   POC Glucose 246 (A) 70 - 99 mg/dl   Lab Results  Component Value Date   HGBA1C 12.5 (A) 06/11/2023  On last visit with me, we left him on Lantus 10 units, Metformin 1 gram BID and jardiance.  Saw clinical pharmacist 06/11/2023 and reported he had stopped taking all meds for 1 wk due to GI upset.  All oral meds were d/c and Insulin increased to 20 units.  Reports he has been taking consistently since that visit. Not checking BS.  "I just forgot."   Eating habits:  drinks sugary drinks HTN:  should be on Norvasc 10 mg, hydrochlorothiazide 25 mg and Cozaar 50 mg daily.  Reports compliance with meds and took already for the morning.  Limits salt in foods HL:  reports he is taking the Crestor consistently   Patient Active Problem List   Diagnosis Date Noted   Moderate persistent asthma with acute exacerbation 02/12/2022   Yeast dermatitis of penis 08/30/2021   Uncircumcised male 08/30/2021   Branch retinal vein occlusion of right eye with macular edema 06/11/2019   Nuclear sclerotic cataract of both eyes 06/11/2019   Retinal edema 06/11/2019   Severe nonproliferative diabetic retinopathy of both eyes  with macular edema associated with type 2 diabetes mellitus (HCC) 06/11/2019   Essential hypertension 09/09/2017   Low back pain 06/24/2017   Uncontrolled type 2 diabetes mellitus with hyperglycemia, without long-term current use of insulin (HCC) 12/25/2016     Current Outpatient Medications on File Prior to Visit  Medication Sig Dispense Refill   Accu-Chek Softclix Lancets lancets Check blood sugar 3 times daily 100 each 12   albuterol (VENTOLIN HFA) 108 (90 Base) MCG/ACT inhaler Inhale 2 puffs into the lungs every 6 (six) hours as needed for wheezing or shortness of breath. 6.7 g 1   amLODipine (NORVASC) 10 MG tablet Take 1 tablet (10 mg total) by mouth daily. 90 tablet 3   Blood Glucose Monitoring Suppl (ONETOUCH VERIO) w/Device KIT Please use to check blood sugar once daily. 1 kit 0   gabapentin (NEURONTIN) 100 MG capsule Take 100 mg by mouth 3 (three) times daily.     glucose blood (ACCU-CHEK GUIDE) test strip Check blood sugars 3 times daily 100 each 12   hydrochlorothiazide (HYDRODIURIL) 25 MG tablet Take 1 tablet (25 mg total) by mouth daily. 90 tablet 3   insulin glargine-yfgn (SEMGLEE, YFGN,) 100 UNIT/ML Pen Inject 20 Units into the skin daily. 15 mL 1   Insulin Pen Needle (PEN NEEDLES) 31G X 8 MM MISC Use with insulin. 100 each 6   losartan (COZAAR) 50 MG tablet Take 1 tablet (50 mg total) by mouth  daily. 90 tablet 1   OneTouch Delica Lancets 33G MISC Please use to check blood sugar once daily. 100 each 3   pantoprazole (PROTONIX) 40 MG tablet Take 1 tablet (40 mg total) by mouth daily. 90 tablet 0   rosuvastatin (CRESTOR) 20 MG tablet Take 1 tablet (20 mg total) by mouth daily. 90 tablet 1   chlorhexidine (PERIDEX) 0.12 % solution Swish for 30 sec then spit, use 2 times daily (Patient not taking: Reported on 06/21/2023) 473 mL 0   fluticasone (FLOVENT HFA) 110 MCG/ACT inhaler Inhale 2 puffs into the lungs 2 (two) times daily. (Patient not taking: Reported on 06/21/2023) 12 g 12   No  current facility-administered medications on file prior to visit.    No Known Allergies  Social History   Socioeconomic History   Marital status: Married    Spouse name: Not on file   Number of children: 3   Years of education: Not on file   Highest education level: Not on file  Occupational History   Not on file  Tobacco Use   Smoking status: Never   Smokeless tobacco: Never  Vaping Use   Vaping Use: Never used  Substance and Sexual Activity   Alcohol use: Yes    Comment: infrequent   Drug use: No   Sexual activity: Yes  Other Topics Concern   Not on file  Social History Narrative   LIves at home with wife and 3 children   Social Determinants of Health   Financial Resource Strain: Low Risk  (04/09/2023)   Overall Financial Resource Strain (CARDIA)    Difficulty of Paying Living Expenses: Not very hard  Food Insecurity: No Food Insecurity (04/09/2023)   Hunger Vital Sign    Worried About Running Out of Food in the Last Year: Never true    Ran Out of Food in the Last Year: Never true  Transportation Needs: No Transportation Needs (04/09/2023)   PRAPARE - Administrator, Civil Service (Medical): No    Lack of Transportation (Non-Medical): No  Physical Activity: Inactive (04/09/2023)   Exercise Vital Sign    Days of Exercise per Week: 0 days    Minutes of Exercise per Session: 0 min  Stress: No Stress Concern Present (04/09/2023)   [employer] of Occupational Health - Occupational Stress Questionnaire    Feeling of Stress : Only a little  Social Connections: Moderately Isolated (04/09/2023)   Social Connection and Isolation Panel [NHANES]    Frequency of Communication with Friends and Family: More than three times a week    Frequency of Social Gatherings with Friends and Family: More than three times a week    Attends Religious Services: Never    Database administrator or Organizations: No    Attends Banker Meetings: Never    Marital  Status: Married  Catering manager Violence: Not At Risk (04/09/2023)   Humiliation, Afraid, Rape, and Kick questionnaire    Fear of Current or Ex-Partner: No    Emotionally Abused: No    Physically Abused: No    Sexually Abused: No    Family History  Problem Relation Age of Onset   Diabetes Mother    High blood pressure Father     Past Surgical History:  Procedure Laterality Date   HERNIA REPAIR      ROS: Review of Systems Negative except as stated above  PHYSICAL EXAM: BP 120/82   Pulse 77   Temp 97.7 F (36.5 C) (  Oral)   Ht 5\' 4"  (1.626 m)   Wt 162 lb (73.5 kg)   SpO2 99%   BMI 27.81 kg/m   Physical Exam   General appearance - alert, well appearing, middle age Hispanic male and in no distress Mental status - normal mood, behavior, speech, dress, motor activity, and thought processes Mouth: Small lesion noted in the LT upper jaw outer maxilla above canine and incisors Neck - supple, no significant adenopathy Chest - clear to auscultation, no wheezes, rales or rhonchi, symmetric air entry Heart - normal rate, regular rhythm, normal S1, S2, no murmurs, rubs, clicks or gallops Extremities - peripheral pulses normal, no pedal edema, no clubbing or cyanosis     Latest Ref Rng & Units 04/09/2023   10:41 AM 03/14/2023   11:24 AM 01/24/2023   10:52 PM  CMP  Glucose 70 - 99 mg/dL 119  147  829   BUN 6 - 24 mg/dL 13  10  9    Creatinine 0.76 - 1.27 mg/dL 5.62  1.30  8.65   Sodium 134 - 144 mmol/L 137  136  135   Potassium 3.5 - 5.2 mmol/L 4.2  4.0  4.0   Chloride 96 - 106 mmol/L 101  97  100   CO2 20 - 29 mmol/L 19  25  24    Calcium 8.7 - 10.2 mg/dL 9.5  9.9  9.1   Total Protein 6.5 - 8.1 g/dL   6.6   Total Bilirubin 0.3 - 1.2 mg/dL   0.9   Alkaline Phos 38 - 126 U/L   90   AST 15 - 41 U/L   31   ALT 0 - 44 U/L   34    Lipid Panel     Component Value Date/Time   CHOL 258 (H) 12/05/2022 1200   TRIG 208 (H) 12/05/2022 1200   HDL 47 12/05/2022 1200   CHOLHDL 5.5  (H) 12/05/2022 1200   LDLCALC 172 (H) 12/05/2022 1200    CBC    Component Value Date/Time   WBC 8.5 01/24/2023 2252   RBC 4.79 01/24/2023 2252   HGB 14.3 01/24/2023 2252   HGB 16.2 12/05/2022 1200   HCT 42.1 01/24/2023 2252   HCT 48.3 12/05/2022 1200   PLT 196 01/24/2023 2252   PLT 215 12/05/2022 1200   MCV 87.9 01/24/2023 2252   MCV 88 12/05/2022 1200   MCH 29.9 01/24/2023 2252   MCHC 34.0 01/24/2023 2252   RDW 12.1 01/24/2023 2252   RDW 12.5 12/05/2022 1200   LYMPHSABS 3.5 01/24/2023 2252   LYMPHSABS 2.4 12/05/2022 1200   MONOABS 0.5 01/24/2023 2252   EOSABS 0.9 (H) 01/24/2023 2252   EOSABS 0.8 (H) 12/05/2022 1200   BASOSABS 0.1 01/24/2023 2252   BASOSABS 0.2 12/05/2022 1200    ASSESSMENT AND PLAN: 1. Preoperative clearance We will work on trying to get his blood sugars a little bit better before his excisional biopsy procedure.  2. Uncontrolled diabetes mellitus with hyperglycemia, with long-term current use of insulin (HCC) Advised patient to check blood sugars twice a day before breakfast and dinner.  Please bring log with him in 2 weeks for follow-up.  Goal is to get those blood sugars before meals between 90-130. -Discussed importance of compliance with medications. Continue Semglee 20 units daily. Restart metformin but at 500 mg twice a day. - POCT glucose (manual entry) - metFORMIN (GLUCOPHAGE) 1000 MG tablet; Take 0.5 tablets (500 mg total) by mouth 2 (two) times daily with a  meal.  Dispense: 60 tablet; Refill: 1  3. Hypertension associated with type 2 diabetes mellitus (HCC) Close to goal.  Continue Cozaar 50 mg, HCTZ 25 mg and amlodipine 10 mg daily.  4. Hyperlipidemia associated with type 2 diabetes mellitus (HCC) Continue Crestor.  5. Screening for colon cancer Discussed the importance of colon cancer screening.  He is agreeable to fit test. - Fecal occult blood, imunochemical([facility])    Patient was given the opportunity to ask questions.   Patient verbalized understanding of the plan and was able to repeat key elements of the plan.   This documentation was completed using Paediatric nurse.  Any transcriptional errors are unintentional.  Orders Placed This Encounter  Procedures   Fecal occult blood, imunochemical([facility])   POCT glucose (manual entry)     Requested Prescriptions   Signed Prescriptions Disp Refills   metFORMIN (GLUCOPHAGE) 1000 MG tablet 60 tablet 1    Sig: Take 0.5 tablets (500 mg total) by mouth 2 (two) times daily with a meal.    Return in about 2 weeks (around 07/05/2023).  Jonah Blue, MD, FACP

## 2023-06-28 ENCOUNTER — Telehealth: Payer: Self-pay

## 2023-06-28 NOTE — Telephone Encounter (Signed)
Copied from CRM [id number]. Topic: General - Other >> Jun 28, 2023  9:20 AM Macon Large wrote: Reason for CRM: Hillary with Dr. Rayfield Citizen office called for an update on medical clearance that was faxed on 06/11/23. Cb# [phone]

## 2023-07-02 NOTE — Telephone Encounter (Signed)
Have called back multiple times but there is no answer. Patient needs to hold off until he sees Dr.Johnson for his next appointment on 07/12/2023. Unable to send fax at this time.

## 2023-07-03 NOTE — Telephone Encounter (Signed)
Called Dr. Rayfield Citizen office on 07/03/2023. Spoke to Francis. Informed that patient is being held off until his upcoming appointment on 07/12/2023. Hillary expressed verbal understanding.

## 2023-07-09 ENCOUNTER — Other Ambulatory Visit: Payer: Self-pay | Admitting: Pharmacist

## 2023-07-09 ENCOUNTER — Other Ambulatory Visit: Payer: Self-pay

## 2023-07-09 MED ORDER — EMPAGLIFLOZIN 10 MG PO TABS
10.0000 mg | ORAL_TABLET | Freq: Every day | ORAL | 3 refills | Status: DC
  Filled 2023-07-09: qty 30, 30d supply, fill #0

## 2023-07-10 LAB — FECAL OCCULT BLOOD, IMMUNOCHEMICAL: Fecal Occult Bld: NEGATIVE

## 2023-07-12 ENCOUNTER — Ambulatory Visit: Payer: Self-pay | Admitting: Internal Medicine

## 2023-07-12 NOTE — Telephone Encounter (Signed)
Patient called and cancelled appointment on 07/12/2023 and rescheduled appointment to 08/01/2023.

## 2023-07-16 ENCOUNTER — Ambulatory Visit: Payer: Self-pay | Admitting: Internal Medicine

## 2023-08-01 ENCOUNTER — Ambulatory Visit: Payer: Self-pay | Admitting: Internal Medicine

## 2023-08-14 NOTE — Progress Notes (Incomplete)
S:     No chief complaint on file.  47 y.o. male who presents for diabetes evaluation, education, and management. Patient was referred and last seen by Primary Care Provider, Dr. Laural Benes, on 06/21/23. Patient was last seen by pharmacy clinic on 06/11/23  PMH is significant for T2DM, HTN, HLD, diabetic retinopathy, asthma.   At last visit, A1c was 12.5, patient reported not taking Jardiance and only taking metformin once a day. These meds were discontinued and his basal insulin was increased from 10 to 20 units daily. Dr. Laural Benes restarted metformin 500 mg BID on 06/21/23. Non-adherence to BP meds was also noted, though BP as of recent has been at goal or close. Does not check BG at home.   Today, Patient arrives in *** good spirits and presents without *** any assistance.   Patient reports Diabetes was diagnosed in ***.   Family/Social History: DM, HTN  Current diabetes medications include: Semglee 20 units daily  Current hypertension medications include: losartan 50 mg daily, amlodipine 10 mg daily, hydrochlorothiazide 25 mg daily Current hyperlipidemia medications include: rosuvastatin  Patient reports adherence to taking all medications as prescribed.  *** Patient denies adherence with medications, reports missing *** medications *** times per week, on average.  Do you feel that your medications are working for you? {YES NO:22349} Have you been experiencing any side effects to the medications prescribed?  --constipation w/ metformin in the past Do you have any problems obtaining medications due to transportation or finances? {YES [id number] Insurance coverage: ***  Patient {Actions; denies-reports:120008} hypoglycemic events.  Reported home fasting blood sugars: ***  Reported 2 hour post-meal/random blood sugars: ***.  Patient {Actions; denies-reports:120008} nocturia (nighttime urination).  Patient {Actions; denies-reports:120008} neuropathy (nerve pain). Patient {Actions;  denies-reports:120008} visual changes. Patient {Actions; denies-reports:120008} self foot exams.   Patient reported dietary habits: Eats *** meals/day Breakfast: *** Lunch: *** Dinner: *** Snacks: *** Drinks: ***  Within the past 12 months, did you worry whether your food would run out before you got money to buy more? {YES NO:22349} Within the past 12 months, did the food you bought run out, and you didn't have money to get more? {YES NO:22349} PHQ-9 Score: ***  Patient-reported exercise habits: ***   O:   ROS  Physical Exam  7 day average blood glucose: ***  Libre3 *** CGM Download today *** on *** % Time CGM is active: ***% Average Glucose: *** mg/dL Glucose Management Indicator: ***  Glucose Variability: ***% (goal <36%) Time in Goal:  - Time in range 70-180: ***% - Time above range: ***% - Time below range: ***% Observed patterns:   Lab Results  Component Value Date   HGBA1C 12.5 (A) 06/11/2023   There were no vitals filed for this visit.  Lipid Panel     Component Value Date/Time   CHOL 258 (H) 12/05/2022 1200   TRIG 208 (H) 12/05/2022 1200   HDL 47 12/05/2022 1200   CHOLHDL 5.5 (H) 12/05/2022 1200   LDLCALC 172 (H) 12/05/2022 1200    Clinical Atherosclerotic Cardiovascular Disease (ASCVD): No  The 10-year ASCVD risk score (Arnett DK, et al., 2019) is: 7.3%   Values used to calculate the score:     Age: 43 years     Sex: Male     Is Non-Hispanic African American: No     Diabetic: Yes     Tobacco smoker: No     Systolic Blood Pressure: 120 mmHg     Is BP treated: Yes  HDL Cholesterol: 47 mg/dL     Total Cholesterol: 258 mg/dL   Patient is participating in a Managed Medicaid Plan:  {MM YES/NO:27447::"Yes"}   A/P: Diabetes longstanding *** currently ***. Patient is *** able to verbalize appropriate hypoglycemia management plan. Medication adherence appears ***. Control is suboptimal due to ***. -{Meds adjust:18428} basal insulin ***  Lantus/Basaglar/Semglee (insulin glargine) *** Tresiba (insulin degludec) from *** units to *** units daily in the morning. Patient will continue to titrate 1 unit every *** days if fasting blood sugar > 100mg /dl until fasting blood sugars reach goal or next visit.  -{Meds adjust:18428} rapid insulin *** Novolog (insulin aspart) *** Humalog (insulin lispro) from *** to ***.  -{Meds adjust:18428} GLP-1 *** Trulicity (dulaglutide) *** Ozempic (semaglutide) *** Mounjaro (tirzepatide) from *** mg to *** mg .  -{Meds adjust:18428} SGLT2-I *** Farxiga (dapagliflozin) *** Jardiance (empagliflozin) 10 mg. Counseled on sick day rules. -{Meds adjust:18428} metformin ***.  -Patient educated on purpose, proper use, and potential adverse effects of ***.  -Extensively discussed pathophysiology of diabetes, recommended lifestyle interventions, dietary effects on blood sugar control.  -Counseled on s/sx of and management of hypoglycemia.  -Next A1c anticipated ***.   ASCVD risk - primary prevention in patient with diabetes. Last LDL is 172 not at goal of <21 mg/dL. ASCVD risk factors include DM, HTN and 10-year ASCVD risk score of 8.1%. high intensity statin indicated.  -increase rosuvastatin to 40 mg daily -start ezetimibe 10 mg daily  Hypertension longstanding *** currently ***. Blood pressure goal of <130/80 *** mmHg. Medication adherence ***. Blood pressure control is suboptimal due to ***. -{Meds adjust:18428} *** mg.  Written patient instructions provided. Patient verbalized understanding of treatment plan.  Total time in face to face counseling *** minutes.    Follow-up:  Pharmacist *** PCP clinic visit in *** Patient seen with ***

## 2023-08-14 NOTE — Progress Notes (Signed)
S:     No chief complaint on file.  47 y.o. male who presents for diabetes evaluation, education, and management. Patient was referred and last seen by Primary Care Provider, Dr. Laural Benes, on 06/21/23. Patient was last seen by pharmacy clinic on 06/11/23. At that visit with [place], his A1c was 12.5%. We found that his adherence was suboptimal - he had stopped all medications d/t side effects and perceived renal harm. With Dr. Laural Benes last month, he reported that he was not checking blood sugars but was taking insulin daily. Dr. Laural Benes added metformin at that time.   PMH is significant for T2DM, HTN, HLD, diabetic retinopathy, asthma.   Today, Patient arrives in good spirits and presents without any assistance.   Since his last visit, he is not taking insulin. Stopped several days ago d/t burning and swelling at the injection site. Has only attempted injections in the abdomen. Has not used the thigh or back of the arm. He is not taking metformin for fear of it "hurting his 1 remaining kidney". He also admits to not taking medications in general for this reason, including his BP and cholesterol medication.   Family/Social History: DM, HTN  Current diabetes medications include: Semglee 20 units daily  Current hypertension medications include: losartan 50 mg daily, amlodipine 10 mg daily, hydrochlorothiazide 25 mg daily Current hyperlipidemia medications include: rosuvastatin 20 mg daily  Insurance coverage: none  Patient denies hypoglycemic events.  Reported home fasting blood sugars: not checking   Reported 2 hour post-meal/random blood sugars: not checking.  Patient denies nocturia (nighttime urination).  Patient reports neuropathy (nerve pain). Patient reports visual changes. Patient reports self foot exams.   Patient reported dietary habits:  -Not following a diabetic diet.   Patient-reported exercise habits: none   O:   Lab Results  Component Value Date   HGBA1C 12.5 (A)  06/11/2023   There were no vitals filed for this visit.  Lipid Panel     Component Value Date/Time   CHOL 258 (H) 12/05/2022 1200   TRIG 208 (H) 12/05/2022 1200   HDL 47 12/05/2022 1200   CHOLHDL 5.5 (H) 12/05/2022 1200   LDLCALC 172 (H) 12/05/2022 1200    Clinical Atherosclerotic Cardiovascular Disease (ASCVD): No  The 10-year ASCVD risk score (Arnett DK, et al., 2019) is: 7.3%   Values used to calculate the score:     Age: 88 years     Sex: Male     Is Non-Hispanic African American: No     Diabetic: Yes     Tobacco smoker: No     Systolic Blood Pressure: 120 mmHg     Is BP treated: Yes     HDL Cholesterol: 47 mg/dL     Total Cholesterol: 258 mg/dL   Patient is participating in a Managed Medicaid Plan: no   A/P: Diabetes longstanding currently uncontrolled, far from goal A1c <7%. Patient is able to verbalize appropriate hypoglycemia management plan. Medication adherence is suboptimal. Medication regimen is suboptimal due to non-adherence, side effects, and lack of BG readings. -Started Semglee. Smaller needles sent and pt encouraged to use thigh and posterior upper arm for injections.  -After discussion, pt verbalized understanding to take metformin as prescribed.   -Pt encouraged to check blood sugar at home.  -Patient educated on purpose, proper use, and potential adverse effects of Semglee and metformin.  -Extensively discussed pathophysiology of diabetes, recommended lifestyle interventions, dietary effects on blood sugar control.  -Counseled on s/sx of and management  of hypoglycemia.  -Next A1c anticipated 08/2023.   Written patient instructions provided. Patient verbalized understanding of treatment plan.  Total time in face to face counseling 30 minutes.    Follow-up:  Pharmacist in 1 month  Butch Penny, PharmD, [place], CPP Clinical Pharmacist [facility] [phone]

## 2023-08-15 ENCOUNTER — Ambulatory Visit: Payer: Self-pay | Attending: Internal Medicine | Admitting: Pharmacist

## 2023-08-15 ENCOUNTER — Encounter: Payer: Self-pay | Admitting: Pharmacist

## 2023-08-15 ENCOUNTER — Other Ambulatory Visit: Payer: Self-pay

## 2023-08-15 DIAGNOSIS — Z794 Long term (current) use of insulin: Secondary | ICD-10-CM

## 2023-08-15 DIAGNOSIS — Z7984 Long term (current) use of oral hypoglycemic drugs: Secondary | ICD-10-CM

## 2023-08-15 DIAGNOSIS — E1165 Type 2 diabetes mellitus with hyperglycemia: Secondary | ICD-10-CM

## 2023-08-15 MED ORDER — PEN NEEDLES 32G X 4 MM MISC
3 refills | Status: AC
  Filled 2023-08-15 – 2023-09-16 (×2): qty 100, 90d supply, fill #0
  Filled 2023-12-23 – 2024-03-09 (×3): qty 100, 90d supply, fill #1

## 2023-08-22 ENCOUNTER — Other Ambulatory Visit: Payer: Self-pay

## 2023-08-29 ENCOUNTER — Ambulatory Visit: Payer: Self-pay | Admitting: Physician Assistant

## 2023-08-31 NOTE — Progress Notes (Unsigned)
Established Patient Office Visit  Subjective   Patient ID: Corey Graham, male    DOB: August 05, 1976  Age: 47 y.o. MRN: [id number]  No chief complaint on file.   Med clearance oral surgery  PCP Oletta Darter saw 06/2023 for clearance:  Corey Graham is a 47 y.o. 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 # Maryclare Bean [id number] Haring [phone]   Pt did not bring meds with him.  Need clearance for excisional bx of the maxilla (#6-8) by the [facility] [phone])   DM: Results for orders placed or performed in visit on 06/21/23 POCT glucose (manual entry) Result Value Ref Range   POC Glucose 246 (A) 70 - 99 mg/dl   Recent Labs Lab Results Component Value Date   HGBA1C 12.5 (A) 06/11/2023  On last visit with me, we left him on Lantus 10 units, Metformin 1 gram BID and jardiance.  Saw clinical pharmacist 06/11/2023 and reported he had stopped taking all meds for 1 wk due to GI upset.  All oral meds were d/c and Insulin increased to 20 units.  Reports he has been taking consistently since that visit. Not checking BS.  "I just forgot."   Eating habits:  drinks sugary drinks HTN:  should be on Norvasc 10 mg, hydrochlorothiazide 25 mg and Cozaar 50 mg daily.  Reports compliance with meds and took already for the morning.  Limits salt in foods HL:  reports he is taking the Crestor consistently  Preoperative clearance We will work on trying to get his blood sugars a little bit better before his excisional biopsy procedure.   2. Uncontrolled diabetes mellitus with hyperglycemia, with long-term current use of insulin (HCC) Advised patient to check blood sugars  twice a day before breakfast and dinner.  Please bring log with him in 2 weeks for follow-up.  Goal is to get those blood sugars before meals between 90-130. -Discussed importance of compliance with medications. Continue Semglee 20 units daily. Restart metformin but at 500 mg twice a day. - POCT glucose (manual entry) - metFORMIN (GLUCOPHAGE) 1000 MG tablet; Take 0.5 tablets (500 mg total) by mouth 2 (two) times daily with a meal.  Dispense: 60 tablet; Refill: 1   3. Hypertension associated with type 2 diabetes mellitus (HCC) Close to goal.  Continue Cozaar 50 mg, HCTZ 25 mg and amlodipine 10 mg daily.   4. Hyperlipidemia associated with type 2 diabetes mellitus (HCC) Continue Crestor.   5. Screening for colon cancer Discussed the importance of colon cancer screening.  He is agreeable to fit test. - Fecal occult blood, imunochemical([facility])    {History (Optional):23778}  ROS    Objective:     There were no vitals taken for this visit. {Vitals History (Optional):23777}  Physical Exam   No results found for any visits on 09/03/23.  {Labs (Optional):23779}  The 10-year ASCVD risk score (Arnett DK, et al., 2019) is: 7.3%    Assessment & Plan:   Problem List Items Addressed This Visit   None   No follow-ups on file.    Shan Levans, MD

## 2023-09-03 ENCOUNTER — Other Ambulatory Visit: Payer: Self-pay

## 2023-09-03 ENCOUNTER — Encounter: Payer: Self-pay | Admitting: Critical Care Medicine

## 2023-09-03 ENCOUNTER — Ambulatory Visit: Payer: Self-pay | Attending: Physician Assistant | Admitting: Critical Care Medicine

## 2023-09-03 VITALS — BP 154/89 | HR 86 | Wt 166.0 lb

## 2023-09-03 DIAGNOSIS — E1169 Type 2 diabetes mellitus with other specified complication: Secondary | ICD-10-CM

## 2023-09-03 DIAGNOSIS — Z794 Long term (current) use of insulin: Secondary | ICD-10-CM

## 2023-09-03 DIAGNOSIS — I10 Essential (primary) hypertension: Secondary | ICD-10-CM

## 2023-09-03 DIAGNOSIS — I152 Hypertension secondary to endocrine disorders: Secondary | ICD-10-CM

## 2023-09-03 DIAGNOSIS — E785 Hyperlipidemia, unspecified: Secondary | ICD-10-CM

## 2023-09-03 DIAGNOSIS — M79605 Pain in left leg: Secondary | ICD-10-CM

## 2023-09-03 DIAGNOSIS — M79604 Pain in right leg: Secondary | ICD-10-CM

## 2023-09-03 DIAGNOSIS — E1159 Type 2 diabetes mellitus with other circulatory complications: Secondary | ICD-10-CM

## 2023-09-03 DIAGNOSIS — Z760 Encounter for issue of repeat prescription: Secondary | ICD-10-CM

## 2023-09-03 DIAGNOSIS — E1165 Type 2 diabetes mellitus with hyperglycemia: Secondary | ICD-10-CM

## 2023-09-03 DIAGNOSIS — Z23 Encounter for immunization: Secondary | ICD-10-CM

## 2023-09-03 DIAGNOSIS — J4540 Moderate persistent asthma, uncomplicated: Secondary | ICD-10-CM

## 2023-09-03 LAB — POCT GLYCOSYLATED HEMOGLOBIN (HGB A1C): HbA1c, POC (controlled diabetic range): 13.4 % — AB (ref 0.0–7.0)

## 2023-09-03 LAB — POCT ABI - SCREENING FOR PILOT NO CHARGE
Left ABI: 1.34
Right ABI: 1.35

## 2023-09-03 LAB — GLUCOSE, POCT (MANUAL RESULT ENTRY): POC Glucose: 243 mg/dL — AB (ref 70–99)

## 2023-09-03 MED ORDER — ACCU-CHEK GUIDE VI STRP
ORAL_STRIP | 12 refills | Status: DC
  Filled 2023-09-03: qty 100, 33d supply, fill #0

## 2023-09-03 MED ORDER — INSULIN GLARGINE-YFGN 100 UNIT/ML ~~LOC~~ SOPN
30.0000 [IU] | PEN_INJECTOR | Freq: Every day | SUBCUTANEOUS | 1 refills | Status: DC
  Filled 2023-09-03: qty 15, 50d supply, fill #0
  Filled 2023-12-23: qty 15, 50d supply, fill #1

## 2023-09-03 MED ORDER — METFORMIN HCL 1000 MG PO TABS
1000.0000 mg | ORAL_TABLET | Freq: Every day | ORAL | 2 refills | Status: DC
Start: 2023-09-03 — End: 2024-06-29
  Filled 2023-09-03: qty 90, 90d supply, fill #0
  Filled 2024-03-09: qty 30, 30d supply, fill #1

## 2023-09-03 MED ORDER — LOSARTAN POTASSIUM 50 MG PO TABS
50.0000 mg | ORAL_TABLET | Freq: Every day | ORAL | 1 refills | Status: DC
  Filled 2023-09-03: qty 90, 90d supply, fill #0

## 2023-09-03 MED ORDER — ROSUVASTATIN CALCIUM 20 MG PO TABS
20.0000 mg | ORAL_TABLET | Freq: Every day | ORAL | 1 refills | Status: DC
  Filled 2023-09-03: qty 90, 90d supply, fill #0

## 2023-09-03 MED ORDER — AMLODIPINE BESYLATE 10 MG PO TABS
10.0000 mg | ORAL_TABLET | Freq: Every day | ORAL | 3 refills | Status: DC
  Filled 2023-09-03: qty 90, 90d supply, fill #0

## 2023-09-03 MED ORDER — ACCU-CHEK SOFTCLIX LANCETS MISC
12 refills | Status: DC
  Filled 2023-09-03: qty 100, fill #0

## 2023-09-03 MED ORDER — ALBUTEROL SULFATE HFA 108 (90 BASE) MCG/ACT IN AERS
2.0000 | INHALATION_SPRAY | Freq: Four times a day (QID) | RESPIRATORY_TRACT | 1 refills | Status: AC | PRN
  Filled 2023-09-03: qty 6.7, 25d supply, fill #0

## 2023-09-03 MED ORDER — HYDROCHLOROTHIAZIDE 25 MG PO TABS
25.0000 mg | ORAL_TABLET | Freq: Every day | ORAL | 3 refills | Status: DC
  Filled 2023-09-03: qty 90, 90d supply, fill #0

## 2023-09-03 MED ORDER — GABAPENTIN 300 MG PO CAPS
300.0000 mg | ORAL_CAPSULE | Freq: Every day | ORAL | 2 refills | Status: AC
  Filled 2023-09-03: qty 90, 90d supply, fill #0

## 2023-09-03 NOTE — Progress Notes (Deleted)
Established Patient Office Visit  Subjective   Patient ID: Corey Graham, male    DOB: [birth year]  Age: 47 y.o. MRN: [id number]  No chief complaint on file.   HPI  {History (Optional):23778}  ROS    Objective:     There were no vitals taken for this visit. {Vitals History (Optional):23777}  Physical Exam   No results found for any visits on 09/03/23.  {Labs (Optional):23779}  The 10-year ASCVD risk score (Arnett DK, et al., 2019) is: 7.3%    Assessment & Plan:   Problem List Items Addressed This Visit       Endocrine   Uncontrolled type 2 diabetes mellitus with hyperglycemia, without long-term current use of insulin (HCC) - Primary   Relevant Orders   HgB A1c    No follow-ups on file.    Shan Levans, MD

## 2023-09-03 NOTE — Assessment & Plan Note (Signed)
Hypertension poorly controlled due to medication nonadherence Refilled amlodipine 10 mg daily hydrochlorothiazide 25 mg daily and losartan 50 mg daily and asked patient to return for clinical pharmacy short-term and nurse blood pressure checks short-term  Patient told he cannot be cleared for surgery for now

## 2023-09-03 NOTE — Patient Instructions (Addendum)
All medications refilled Increase insulin to 30 units daily Get a pill organizer from pharmacy Labs today REturn 2 months for primary care and surgery clearance  you are not ready for surgery Return 3 weeks for clinical pharmacy visit [employer] medicamentos reabastecidos Consigue un organizador de pastillas en la farmacia. ABI normal buen flujo sanguneo Aumentar la insulina a 30 unidades diarias [facility] Regrese 2 meses para atencin primaria y autorizacin de [place] si no est listo para la ciruga Regrese 3 semanas para visita a la farmacia clnica [facility]

## 2023-09-03 NOTE — Progress Notes (Addendum)
Acute Office Visit  Subjective:     Patient ID: Corey Graham, male    DOB: 1976-07-02, 47 y.o.   MRN: [id number]  Chief Complaint  Patient presents with   Medical Management of Chronic Issues    HPI Patient is in today for management of chronic conditions and need for clearance for oral surgery visit assisted by Spanish video interpreter Remi Deter [phone]  Patient was seen in early July by his primary care provider Dr. Laural Benes cannot be cleared for surgery on a tooth in the mouth due to elevated blood pressure and blood sugar A1c of 13  Patient returns today and endorses that he does not take his medicines regularly.  He forgets to take many of the pills.  He sometimes does not pick up refills.  The only medicine he is taking is the Lantus and that he misses occasionally.  On arrival blood pressure 154/89 A1c is 13.4 blood sugar 243.  The patient did agree to a flu shot. Patient has no symptoms at this time.  Review of Systems  Constitutional:  Negative for chills, diaphoresis, fever, malaise/fatigue and weight loss.  HENT:  Negative for congestion, hearing loss, nosebleeds, sore throat and tinnitus.        Oral pain  Eyes:  Negative for blurred vision, photophobia and redness.  Respiratory:  Negative for cough, hemoptysis, sputum production, shortness of breath, wheezing and stridor.   Cardiovascular:  Negative for chest pain, palpitations, orthopnea, claudication, leg swelling and PND.  Gastrointestinal:  Negative for abdominal pain, blood in stool, constipation, diarrhea, heartburn, nausea and vomiting.  Genitourinary:  Negative for dysuria, flank pain, frequency, hematuria and urgency.  Musculoskeletal:  Negative for back pain, falls, joint pain, myalgias and neck pain.  Skin:  Negative for itching and rash.  Neurological:  Negative for dizziness, tingling, tremors, sensory change, speech change, focal weakness, seizures, loss of consciousness, weakness and headaches.   Endo/Heme/Allergies:  Negative for environmental allergies and polydipsia. Does not bruise/bleed easily.  Psychiatric/Behavioral:  Negative for depression, memory loss, substance abuse and suicidal ideas. The patient is not nervous/anxious and does not have insomnia.         Objective:    BP (!) 154/89 (BP Location: Left Arm, Patient Position: Sitting, Cuff Size: Normal)   Pulse 86   Wt 166 lb (75.3 kg)   SpO2 98%   BMI 28.49 kg/m    Physical Exam Vitals reviewed.  Constitutional:      Appearance: Normal appearance. He is well-developed. He is not diaphoretic.  HENT:     Head: Normocephalic and atraumatic.     Nose: No nasal deformity, septal deviation, mucosal edema or rhinorrhea.     Right Sinus: No maxillary sinus tenderness or frontal sinus tenderness.     Left Sinus: No maxillary sinus tenderness or frontal sinus tenderness.     Mouth/Throat:     Pharynx: No oropharyngeal exudate.     Comments: Poor dentition fracture right lower last molar Eyes:     General: No scleral icterus.    Conjunctiva/sclera: Conjunctivae normal.     Pupils: Pupils are equal, round, and reactive to light.  Neck:     Thyroid: No thyromegaly.     Vascular: No carotid bruit or JVD.     Trachea: Trachea normal. No tracheal tenderness or tracheal deviation.  Cardiovascular:     Rate and Rhythm: Normal rate and regular rhythm.     Chest Wall: PMI is not displaced.     Pulses:  Normal pulses. No decreased pulses.     Heart sounds: Normal heart sounds, S1 normal and S2 normal. Heart sounds not distant. No murmur heard.    No systolic murmur is present.     No diastolic murmur is present.     No friction rub. No gallop. No S3 or S4 sounds.  Pulmonary:     Effort: No tachypnea, accessory muscle usage or respiratory distress.     Breath sounds: No stridor. No decreased breath sounds, wheezing, rhonchi or rales.  Chest:     Chest wall: No tenderness.  Abdominal:     General: Bowel sounds are  normal. There is no distension.     Palpations: Abdomen is soft. Abdomen is not rigid.     Tenderness: There is no abdominal tenderness. There is no guarding or rebound.  Musculoskeletal:        General: Normal range of motion.     Cervical back: Normal range of motion and neck supple. No edema, erythema or rigidity. No muscular tenderness. Normal range of motion.     Comments: Foot exam shows callus right great toe otherwise normal Decreased pulses bilaterally  Lymphadenopathy:     Head:     Right side of head: No submental or submandibular adenopathy.     Left side of head: No submental or submandibular adenopathy.     Cervical: No cervical adenopathy.  Skin:    General: Skin is warm and dry.     Coloration: Skin is not pale.     Findings: No rash.     Nails: There is no clubbing.  Neurological:     Mental Status: He is alert and oriented to person, place, and time.     Sensory: No sensory deficit.  Psychiatric:        Speech: Speech normal.        Behavior: Behavior normal.     Results for orders placed or performed in visit on 09/03/23  HgB A1c  Result Value Ref Range   Hemoglobin A1C     HbA1c POC (<> result, manual entry)     HbA1c, POC (prediabetic range)     HbA1c, POC (controlled diabetic range) 13.4 (A) 0.0 - 7.0 %  POCT glucose (manual entry)  Result Value Ref Range   POC Glucose 243 (A) 70 - 99 mg/dl  POCT ABI Screening Pilot No Charge  Result Value Ref Range   Left ABI 1.34    Right ABI 1.35   c0 Result Notes    Component 17:25  Left ABI 1.34  Right ABI 1.35          Assessment & Plan:   Problem List Items Addressed This Visit       Cardiovascular and Mediastinum   Essential hypertension    Hypertension poorly controlled due to medication nonadherence Refilled amlodipine 10 mg daily hydrochlorothiazide 25 mg daily and losartan 50 mg daily and asked patient to return for clinical pharmacy short-term and nurse blood pressure checks  short-term  Patient told he cannot be cleared for surgery for now      Relevant Medications   amLODipine (NORVASC) 10 MG tablet   rosuvastatin (CRESTOR) 20 MG tablet   hydrochlorothiazide (HYDRODIURIL) 25 MG tablet   losartan (COZAAR) 50 MG tablet   Other Relevant Orders   BMP8+eGFR     Respiratory   Asthma, moderate persistent    Stable at this time use albuterol as needed      Relevant Medications  albuterol (VENTOLIN HFA) 108 (90 Base) MCG/ACT inhaler     Endocrine   Uncontrolled type 2 diabetes mellitus with hyperglycemia, without long-term current use of insulin (HCC) - Primary    A1c today 13 patient uncontrolled plan is to change metformin to 1000 mg daily increase insulin glargine to 30 units daily and see clinical pharmacy short-term Patient cannot be cleared for oral surgery      Relevant Medications   Accu-Chek Softclix Lancets lancets   glucose blood (ACCU-CHEK GUIDE) test strip   rosuvastatin (CRESTOR) 20 MG tablet   losartan (COZAAR) 50 MG tablet   insulin glargine-yfgn (SEMGLEE, YFGN,) 100 UNIT/ML Pen   metFORMIN (GLUCOPHAGE) 1000 MG tablet   Other Relevant Orders   HgB A1c (Completed)   POCT glucose (manual entry) (Completed)   BMP8+eGFR     Other   Leg pain, bilateral    Leg pain bilaterally and decreased pulses on foot exam but ABI screen was normal      Relevant Orders   POCT ABI Screening Pilot No Charge (Completed)   Other Visit Diagnoses     Medication refill       Relevant Medications   amLODipine (NORVASC) 10 MG tablet   hydrochlorothiazide (HYDRODIURIL) 25 MG tablet   Hyperlipidemia associated with type 2 diabetes mellitus (HCC)       Relevant Medications   amLODipine (NORVASC) 10 MG tablet   rosuvastatin (CRESTOR) 20 MG tablet   hydrochlorothiazide (HYDRODIURIL) 25 MG tablet   losartan (COZAAR) 50 MG tablet   insulin glargine-yfgn (SEMGLEE, YFGN,) 100 UNIT/ML Pen   metFORMIN (GLUCOPHAGE) 1000 MG tablet   Hypertension associated  with type 2 diabetes mellitus (HCC)       Relevant Medications   amLODipine (NORVASC) 10 MG tablet   rosuvastatin (CRESTOR) 20 MG tablet   hydrochlorothiazide (HYDRODIURIL) 25 MG tablet   losartan (COZAAR) 50 MG tablet   insulin glargine-yfgn (SEMGLEE, YFGN,) 100 UNIT/ML Pen   metFORMIN (GLUCOPHAGE) 1000 MG tablet   Uncontrolled diabetes mellitus with hyperglycemia, with long-term current use of insulin (HCC)       Relevant Medications   rosuvastatin (CRESTOR) 20 MG tablet   losartan (COZAAR) 50 MG tablet   insulin glargine-yfgn (SEMGLEE, YFGN,) 100 UNIT/ML Pen   metFORMIN (GLUCOPHAGE) 1000 MG tablet   Need for immunization against influenza       Relevant Orders   Flu vaccine trivalent PF, 6mos and older(Flulaval,Afluria,Fluarix,Fluzone) (Completed)       Meds ordered this encounter  Medications   Accu-Chek Softclix Lancets lancets    Sig: Check blood sugar 3 times daily    Dispense:  100 each    Refill:  12   glucose blood (ACCU-CHEK GUIDE) test strip    Sig: Check blood sugars 3 times daily    Dispense:  100 each    Refill:  12   albuterol (VENTOLIN HFA) 108 (90 Base) MCG/ACT inhaler    Sig: Inhale 2 puffs into the lungs every 6 (six) hours as needed for wheezing or shortness of breath.    Dispense:  6.7 g    Refill:  1   amLODipine (NORVASC) 10 MG tablet    Sig: Take 1 tablet (10 mg total) by mouth daily.    Dispense:  90 tablet    Refill:  3   rosuvastatin (CRESTOR) 20 MG tablet    Sig: Take 1 tablet (20 mg total) by mouth daily.    Dispense:  90 tablet    Refill:  1  hydrochlorothiazide (HYDRODIURIL) 25 MG tablet    Sig: Take 1 tablet (25 mg total) by mouth daily.    Dispense:  90 tablet    Refill:  3   losartan (COZAAR) 50 MG tablet    Sig: Take 1 tablet (50 mg total) by mouth daily.    Dispense:  90 tablet    Refill:  1   gabapentin (NEURONTIN) 300 MG capsule    Sig: Take 1 capsule (300 mg total) by mouth at bedtime.    Dispense:  90 capsule    Refill:   2   insulin glargine-yfgn (SEMGLEE, YFGN,) 100 UNIT/ML Pen    Sig: Inject 30 Units into the skin daily.    Dispense:  15 mL    Refill:  1   metFORMIN (GLUCOPHAGE) 1000 MG tablet    Sig: Take 1 tablet (1,000 mg total) by mouth daily with breakfast.    Dispense:  90 tablet    Refill:  2  30 minutes spent extra time needed due to language barrier and multisystem assessments  Return in about 2 months (around 11/03/2023) for htn, diabetes.  Shan Levans, MD

## 2023-09-03 NOTE — Assessment & Plan Note (Signed)
Leg pain bilaterally and decreased pulses on foot exam but ABI screen was normal

## 2023-09-03 NOTE — Assessment & Plan Note (Signed)
Stable at this time use albuterol as needed

## 2023-09-03 NOTE — Assessment & Plan Note (Addendum)
A1c today 13 patient uncontrolled plan is to change metformin to 1000 mg daily increase insulin glargine to 30 units daily and see clinical pharmacy short-term Patient cannot be cleared for oral surgery

## 2023-09-03 NOTE — Addendum Note (Signed)
Addended by: Dalene Carrow I on: 09/03/2023 05:26 PM   Modules accepted: Orders

## 2023-09-04 ENCOUNTER — Telehealth: Payer: Self-pay

## 2023-09-04 NOTE — Progress Notes (Signed)
Let patient know kidney function normal

## 2023-09-04 NOTE — Telephone Encounter (Signed)
-----   Message from Shan Levans sent at 09/04/2023 12:25 PM EDT ----- Let patient know kidney function normal

## 2023-09-04 NOTE — Telephone Encounter (Signed)
Pt was called and vm was left, Information has been sent to nurse pool.   Interpreter id # [id number]

## 2023-09-09 ENCOUNTER — Other Ambulatory Visit: Payer: Self-pay

## 2023-09-09 ENCOUNTER — Telehealth: Payer: Self-pay

## 2023-09-09 NOTE — Telephone Encounter (Signed)
Copied from CRM [id number]. Topic: General - Other >> Sep 09, 2023  8:56 AM Phill Myron wrote: Re-faxing Document (2)   H[place], [facility]   fax# [phone]   telephone# [phone] Faxing a document for medical clearance, for patient to have a procedure.Please advise and notate in chart if the document has been received

## 2023-09-10 ENCOUNTER — Other Ambulatory Visit: Payer: Self-pay

## 2023-09-11 ENCOUNTER — Telehealth: Payer: Self-pay | Admitting: Internal Medicine

## 2023-09-11 NOTE — Telephone Encounter (Signed)
Form successfully faxed to [facility].

## 2023-09-11 NOTE — Telephone Encounter (Signed)
Duplicate

## 2023-09-11 NOTE — Telephone Encounter (Signed)
FYI

## 2023-09-11 NOTE — Telephone Encounter (Signed)
Hiliary from [facility] called stated they need to get the medical clearance form completed and faxed back to them as patient needs to be scheduled for an oral procedure. If form has not been recvd by our office that was sent on 9/19 and 9/23 please contact Hiliary for an alternate fax# or email.

## 2023-09-16 ENCOUNTER — Other Ambulatory Visit: Payer: Self-pay

## 2023-09-19 ENCOUNTER — Ambulatory Visit: Payer: Self-pay | Admitting: Pharmacist

## 2023-09-30 ENCOUNTER — Ambulatory Visit: Payer: Self-pay | Attending: Internal Medicine | Admitting: Pharmacist

## 2023-09-30 ENCOUNTER — Encounter: Payer: Self-pay | Admitting: Pharmacist

## 2023-09-30 ENCOUNTER — Other Ambulatory Visit: Payer: Self-pay

## 2023-09-30 ENCOUNTER — Encounter: Payer: Self-pay | Admitting: Nurse Practitioner

## 2023-09-30 ENCOUNTER — Ambulatory Visit: Payer: Self-pay | Attending: Nurse Practitioner | Admitting: Nurse Practitioner

## 2023-09-30 VITALS — BP 145/84 | HR 90

## 2023-09-30 VITALS — BP 129/78 | HR 91 | Ht 64.0 in | Wt 167.6 lb

## 2023-09-30 DIAGNOSIS — Z7984 Long term (current) use of oral hypoglycemic drugs: Secondary | ICD-10-CM

## 2023-09-30 DIAGNOSIS — E1165 Type 2 diabetes mellitus with hyperglycemia: Secondary | ICD-10-CM

## 2023-09-30 DIAGNOSIS — Z794 Long term (current) use of insulin: Secondary | ICD-10-CM

## 2023-09-30 DIAGNOSIS — N471 Phimosis: Secondary | ICD-10-CM

## 2023-09-30 MED ORDER — BETAMETHASONE DIPROPIONATE 0.05 % EX CREA
TOPICAL_CREAM | Freq: Two times a day (BID) | CUTANEOUS | 0 refills | Status: AC
Start: 2023-09-30 — End: ?
  Filled 2023-09-30 (×2): qty 30, 30d supply, fill #0

## 2023-09-30 MED ORDER — FLUCONAZOLE 150 MG PO TABS
150.0000 mg | ORAL_TABLET | ORAL | 0 refills | Status: DC
Start: 2023-09-30 — End: 2023-11-04
  Filled 2023-09-30: qty 3, 9d supply, fill #0

## 2023-09-30 NOTE — Progress Notes (Signed)
S:     No chief complaint on file.  47 y.o. male who presents for diabetes evaluation, education, and management. Patient was referred and last seen by Primary Care Provider, Dr. Laural Benes, on 06/21/23. Patient was last seen by pharmacy clinic on 08/15/23. At that visit with [place], he reported medication non-adherence. Provided education on side effects of medications. Encouraged patient to take metformin as prescribed and start taking Semglee. Provided RX for smaller pen needles to help with injection site irritation and encouraged him to try injecting in the thigh or back of arm. Last visit with Dr. Delford Field on 9/17 and A1c was 13.4%. Patient reported medication non-adherence. Dr. Delford Field increased metformin to 1000 mg daily and Semglee to 30 units daily.  PMH is significant for T2DM, HTN, HLD, diabetic retinopathy, asthma.   Today, patient reports that he's doing better with adherence. He is only taking 1 tablet of metformin in the morning but this is improved since he was not taking any at all prior to seeing Dr. Delford Field last month. Additionally, he is taking insulin 30u once daily and endorses adherence with his antihypertensives. His fill history shows that all meds were last filled 09/16/2023. Commended him for this!   Family/Social History: DM, HTN  Current diabetes medications include: Semglee 20 units daily  Current hypertension medications include: losartan 50 mg daily, amlodipine 10 mg daily, hydrochlorothiazide 25 mg daily Current hyperlipidemia medications include: rosuvastatin 20 mg daily  Insurance coverage: none  Patient denies hypoglycemic events.  Reported home fasting blood sugars: not checking   Reported 2 hour post-meal/random blood sugars: not checking.  Patient denies nocturia (nighttime urination).  Patient reports neuropathy (nerve pain). Patient reports visual changes. Patient reports self foot exams.   Patient reported dietary habits:  -Not following a diabetic diet.    Patient-reported exercise habits: none   O:   Lab Results  Component Value Date   HGBA1C 13.4 (A) 09/03/2023   There were no vitals filed for this visit.  Lipid Panel     Component Value Date/Time   CHOL 258 (H) 12/05/2022 1200   TRIG 208 (H) 12/05/2022 1200   HDL 47 12/05/2022 1200   CHOLHDL 5.5 (H) 12/05/2022 1200   LDLCALC 172 (H) 12/05/2022 1200    Clinical Atherosclerotic Cardiovascular Disease (ASCVD): No  The 10-year ASCVD risk score (Arnett DK, et al., 2019) is: 11.2%   Values used to calculate the score:     Age: 46 years     Sex: Male     Is Non-Hispanic African American: No     Diabetic: Yes     Tobacco smoker: No     Systolic Blood Pressure: 154 mmHg     Is BP treated: Yes     HDL Cholesterol: 47 mg/dL     Total Cholesterol: 258 mg/dL   Patient is participating in a Managed Medicaid Plan: no   A/P: Diabetes longstanding currently uncontrolled, far from goal A1c <7%. Patient is able to verbalize appropriate hypoglycemia management plan. Medication adherence is improving but he is not checking glucose levels at home. I have advised him to do this and report back in 1 month so we can make changes.  -Continue Semglee 30 once daily. -Continue metformin 500 mg once daily for now.   -Patient educated on purpose, proper use, and potential adverse effects of Semglee and metformin.  -Extensively discussed pathophysiology of diabetes, recommended lifestyle interventions, dietary effects on blood sugar control.  -Counseled on s/sx of and management  of hypoglycemia.  -Next A1c anticipated 11/2023.   Written patient instructions provided. Patient verbalized understanding of treatment plan.  Total time in face to face counseling 30 minutes.    Follow-up:  Pharmacist in 1 month  Butch Penny, PharmD, [place], CPP Clinical Pharmacist [facility] [phone]

## 2023-09-30 NOTE — Patient Instructions (Signed)
Aplique la pomada en el anillo apretado del prepucio y/o la cabeza del pene. La pomada se masajea en las reas [employer] veces al da durante 6 a 8 semanas junto con estiramiento/retraccin IT trainer al da. Una vez que el prepucio se puede retraer completamente, se suspende la pomada y la retraccin manual diaria evitar que vuelva a ocurrir la fimosis.

## 2023-09-30 NOTE — Progress Notes (Signed)
[employer] Interpreter

## 2023-09-30 NOTE — Progress Notes (Signed)
Assessment & Plan:  [name] was seen today for male yeast infection and phimosis.  Diagnoses and all orders for this visit:  Phimosis of penis -     betamethasone dipropionate 0.05 % cream; Apply topically 2 (two) times daily. Crema de betametasona (0,05%) aplicada dos veces al da directamente sobre y alrededor de la punta del pene durante cuatro a ocho semanas. -     fluconazole (DIFLUCAN) 150 MG tablet; Take 1 tablet (150 mg total) by mouth every 3 (three) days. -     Ambulatory referral to Urology    Patient has been counseled on age-appropriate routine health concerns for screening and prevention. These are reviewed and up-to-date. Referrals have been placed accordingly. Immunizations are up-to-date or declined.    Subjective:   Chief Complaint  Patient presents with   male yeast infection   phimosis   HPI Corey Graham 47 y.o. male presents to office today with complaints of difficulty retracting his foreskin He is a patient of Dr. Laural Benes  VRI was used to communicate directly with patient for the entire encounter including providing detailed patient instructions.      Corey Graham reports that he has been having difficulty retracting the foreskin of his penis back for the past 6 months. Sexual intercourse with his wife has been painful. States when he tries to retract it it does become very red and will crack and bleed. Denies dysuria, penile discharge. He has experienced these symptoms in the past and was prescribed nystatin topical and diflucan po with good results.  He does have a history of diabetes. Lab Results  Component Value Date   HGBA1C 13.4 (A) 09/03/2023     Review of Systems  Constitutional:  Negative for fever, malaise/fatigue and weight loss.  HENT: Negative.  Negative for nosebleeds.   Eyes: Negative.  Negative for blurred vision, double vision and photophobia.  Respiratory: Negative.  Negative for cough and shortness of breath.    Cardiovascular: Negative.  Negative for chest pain, palpitations and leg swelling.  Gastrointestinal: Negative.  Negative for heartburn, nausea and vomiting.  Genitourinary:        SEE HPI  Musculoskeletal: Negative.  Negative for myalgias.  Neurological: Negative.  Negative for dizziness, focal weakness, seizures and headaches.  Psychiatric/Behavioral: Negative.  Negative for suicidal ideas.     Past Medical History:  Diagnosis Date   Diabetes mellitus without complication (HCC) 2012   Essential hypertension 09/09/2017   Low back pain 06/24/2017    Past Surgical History:  Procedure Laterality Date   HERNIA REPAIR      Family History  Problem Relation Age of Onset   Diabetes Mother    High blood pressure Father     Social History Reviewed with no changes to be made today.   Outpatient Medications Prior to Visit  Medication Sig Dispense Refill   Accu-Chek Softclix Lancets lancets Check blood sugar 3 times daily 100 each 12   albuterol (VENTOLIN HFA) 108 (90 Base) MCG/ACT inhaler Inhale 2 puffs into the lungs every 6 (six) hours as needed for wheezing or shortness of breath. 6.7 g 1   amLODipine (NORVASC) 10 MG tablet Take 1 tablet (10 mg total) by mouth daily. 90 tablet 3   Blood Glucose Monitoring Suppl (ONETOUCH VERIO) w/Device KIT Please use to check blood sugar once daily. (Patient not taking: Reported on 09/03/2023) 1 kit 0   chlorhexidine (PERIDEX) 0.12 % solution Swish for 30 sec then spit, use 2 times daily (Patient  not taking: Reported on 06/21/2023) 473 mL 0   gabapentin (NEURONTIN) 300 MG capsule Take 1 capsule (300 mg total) by mouth at bedtime. 90 capsule 2   glucose blood (ACCU-CHEK GUIDE) test strip Check blood sugars 3 times daily 100 each 12   hydrochlorothiazide (HYDRODIURIL) 25 MG tablet Take 1 tablet (25 mg total) by mouth daily. 90 tablet 3   insulin glargine-yfgn (SEMGLEE, YFGN,) 100 UNIT/ML Pen Inject 30 Units into the skin daily. 15 mL 1   Insulin Pen Needle  (PEN NEEDLES) 32G X 4 MM MISC Use to inject insulin once daily. Stop 8 mm needles. (Patient not taking: Reported on 09/03/2023) 100 each 3   losartan (COZAAR) 50 MG tablet Take 1 tablet (50 mg total) by mouth daily. 90 tablet 1   metFORMIN (GLUCOPHAGE) 1000 MG tablet Take 1 tablet (1,000 mg total) by mouth daily with breakfast. 90 tablet 2   OneTouch Delica Lancets 33G MISC Please use to check blood sugar once daily. (Patient not taking: Reported on 09/03/2023) 100 each 3   pantoprazole (PROTONIX) 40 MG tablet Take 1 tablet (40 mg total) by mouth daily. (Patient not taking: Reported on 09/03/2023) 90 tablet 0   rosuvastatin (CRESTOR) 20 MG tablet Take 1 tablet (20 mg total) by mouth daily. 90 tablet 1   No facility-administered medications prior to visit.    No Known Allergies     Objective:    BP 129/78 (BP Location: Left Arm, Patient Position: Sitting, Cuff Size: Normal)   Pulse 91   Ht 5\' 4"  (1.626 m)   Wt 167 lb 9.6 oz (76 kg)   SpO2 97%   BMI 28.77 kg/m  Wt Readings from Last 3 Encounters:  09/30/23 167 lb 9.6 oz (76 kg)  09/03/23 166 lb (75.3 kg)  06/21/23 162 lb (73.5 kg)    Physical Exam Vitals and nursing note reviewed.  Constitutional:      Appearance: He is well-developed.  HENT:     Head: Normocephalic and atraumatic.  Cardiovascular:     Rate and Rhythm: Normal rate and regular rhythm.     Heart sounds: Normal heart sounds. No murmur heard.    No friction rub. No gallop.  Pulmonary:     Effort: Pulmonary effort is normal. No tachypnea or respiratory distress.     Breath sounds: Normal breath sounds. No decreased breath sounds, wheezing, rhonchi or rales.  Chest:     Chest wall: No tenderness.  Abdominal:     General: Bowel sounds are normal.     Palpations: Abdomen is soft.  Genitourinary:    Penis: Circumcised. Phimosis, erythema, tenderness and swelling present. No hypospadias, discharge or lesions.   Musculoskeletal:        General: Normal range of  motion.     Cervical back: Normal range of motion.  Skin:    General: Skin is warm and dry.  Neurological:     Mental Status: He is alert and oriented to person, place, and time.     Coordination: Coordination normal.  Psychiatric:        Behavior: Behavior normal. Behavior is cooperative.        Thought Content: Thought content normal.        Judgment: Judgment normal.          Patient has been counseled extensively about nutrition and exercise as well as the importance of adherence with medications and regular follow-up. The patient was given clear instructions to go to ER or return to  medical center if symptoms don't improve, worsen or new problems develop. The patient verbalized understanding.   Follow-up: Return if symptoms worsen or fail to improve.   Claiborne Rigg, FNP-BC [facility] [place], [place] [phone]   09/30/2023, 2:22 PM

## 2023-10-07 ENCOUNTER — Other Ambulatory Visit: Payer: Self-pay

## 2023-10-15 ENCOUNTER — Telehealth: Payer: Self-pay

## 2023-10-15 NOTE — Telephone Encounter (Signed)
Copied from CRM [id number]. Topic: General - Other >> Oct 14, 2023  2:34 PM Turkey B wrote: Skeet Simmer from [facility], say received clinical notes from September, but needs some for October. She is sending that request today, for medical clearance

## 2023-10-16 NOTE — Telephone Encounter (Signed)
Noted  

## 2023-10-16 NOTE — Telephone Encounter (Signed)
Keep appt in November.

## 2023-10-17 NOTE — Telephone Encounter (Signed)
Hillary from [facility] called in  about medical clearance, does pt have to wait until his November appt before getting clearance?

## 2023-10-18 NOTE — Telephone Encounter (Signed)
Will forward to nurse 

## 2023-10-21 ENCOUNTER — Ambulatory Visit: Payer: Self-pay | Admitting: Urology

## 2023-10-22 NOTE — Telephone Encounter (Signed)
Hiliary from [facility] called several times to see if the medical clearance needs to wait until after the 11/18 appt with provider. Please f/u to let Hilliary know if patient needs to come in for the appt on the 18th before the medical clearance can be completed or if it can be done now

## 2023-10-22 NOTE — Telephone Encounter (Signed)
Please advise 

## 2023-10-25 NOTE — Telephone Encounter (Signed)
Hillary from [employer] is calling to f/u on the medical clearance.  Call dropped.    Please advise.

## 2023-10-25 NOTE — Telephone Encounter (Signed)
Let caller know that we have been trying to work with the patient to get his blood sugars under control before clearing him for the procedure.  He has an upcoming appointment with [place] on the 18th of this month.  Hopefully he keeps the appointment and we can clear him at that time.

## 2023-10-25 NOTE — Telephone Encounter (Signed)
Corey Graham called back because call dropped, wants to know when she can expect correspondence. Does patient need to be seen prior the medical clearance getting approved? Please contact Corey Graham today and advise.   Best contact: [phone] (they close at 1pm today)

## 2023-10-28 NOTE — Telephone Encounter (Signed)
Noted  

## 2023-10-28 NOTE — Telephone Encounter (Signed)
Called [name] and made her aware of note

## 2023-10-31 ENCOUNTER — Ambulatory Visit: Payer: Self-pay | Admitting: Pharmacist

## 2023-11-04 ENCOUNTER — Encounter: Payer: Self-pay | Admitting: Internal Medicine

## 2023-11-04 ENCOUNTER — Other Ambulatory Visit: Payer: Self-pay

## 2023-11-04 ENCOUNTER — Ambulatory Visit: Payer: Self-pay | Attending: Internal Medicine | Admitting: Internal Medicine

## 2023-11-04 VITALS — BP 128/90 | HR 89 | Temp 97.9°F | Ht 64.0 in | Wt 162.0 lb

## 2023-11-04 DIAGNOSIS — E1165 Type 2 diabetes mellitus with hyperglycemia: Secondary | ICD-10-CM

## 2023-11-04 DIAGNOSIS — Z794 Long term (current) use of insulin: Secondary | ICD-10-CM

## 2023-11-04 DIAGNOSIS — E1159 Type 2 diabetes mellitus with other circulatory complications: Secondary | ICD-10-CM

## 2023-11-04 DIAGNOSIS — I152 Hypertension secondary to endocrine disorders: Secondary | ICD-10-CM

## 2023-11-04 DIAGNOSIS — J4541 Moderate persistent asthma with (acute) exacerbation: Secondary | ICD-10-CM

## 2023-11-04 LAB — POCT URINALYSIS DIP (CLINITEK)
Bilirubin, UA: NEGATIVE
Blood, UA: NEGATIVE
Glucose, UA: >=1000 mg/dL — AB
Ketones, POC UA: NEGATIVE mg/dL
Leukocytes, UA: NEGATIVE
Nitrite, UA: NEGATIVE
POC PROTEIN,UA: 100 — AB
Spec Grav, UA: 1.015 (ref 1.010–1.025)
Urobilinogen, UA: 0.2 U/dL
pH, UA: 5.5 (ref 5.0–8.0)

## 2023-11-04 LAB — GLUCOSE, POCT (MANUAL RESULT ENTRY): POC Glucose: 351 mg/dL — AB (ref 70–99)

## 2023-11-04 LAB — POCT GLYCOSYLATED HEMOGLOBIN (HGB A1C): HbA1c, POC (controlled diabetic range): 14.3 % — AB (ref 0.0–7.0)

## 2023-11-04 MED ORDER — NOVOLOG FLEXPEN 100 UNIT/ML ~~LOC~~ SOPN
5.0000 [IU] | PEN_INJECTOR | Freq: Three times a day (TID) | SUBCUTANEOUS | 11 refills | Status: DC
  Filled 2023-11-04: qty 12, 80d supply, fill #0
  Filled 2024-03-09: qty 12, 80d supply, fill #1
  Filled 2024-03-09: qty 6, 40d supply, fill #1
  Filled 2024-03-09: qty 12, 80d supply, fill #1

## 2023-11-04 MED ORDER — BUDESONIDE-FORMOTEROL FUMARATE 80-4.5 MCG/ACT IN AERO
2.0000 | INHALATION_SPRAY | Freq: Two times a day (BID) | RESPIRATORY_TRACT | 3 refills | Status: AC
  Filled 2023-11-04: qty 10.2, 30d supply, fill #0

## 2023-11-04 MED ORDER — AZITHROMYCIN 250 MG PO TABS
ORAL_TABLET | ORAL | 0 refills | Status: AC
  Filled 2023-11-04: qty 6, 5d supply, fill #0

## 2023-11-04 MED ORDER — HYDRALAZINE HCL 10 MG PO TABS
10.0000 mg | ORAL_TABLET | Freq: Two times a day (BID) | ORAL | 1 refills | Status: DC
  Filled 2023-11-04: qty 180, 90d supply, fill #0

## 2023-11-04 MED ORDER — PANTOPRAZOLE SODIUM 40 MG PO TBEC
40.0000 mg | DELAYED_RELEASE_TABLET | Freq: Every day | ORAL | 0 refills | Status: AC
Start: 2023-11-04 — End: ?
  Filled 2023-11-04: qty 90, 90d supply, fill #0

## 2023-11-04 NOTE — Patient Instructions (Signed)
We have added another insulin called NovoLog for you to take 5 units with meals.  Continue glargine insulin 30 units at bedtime.  We will have you follow-up with our clinical pharmacist in 1 month for recheck of your diabetes.  Please check blood sugars at least once a day and record the readings.  Bring readings with you on your next visit.  Your blood pressure is not at goal.  We have added another blood pressure medicine called hydralazine 10 mg twice a day.  Please bring your medicines with you to all of your visits.  We cannot clear you for dental surgery at this time.  We need to get your blood sugars better before we can clear you for surgery.

## 2023-11-04 NOTE — Progress Notes (Signed)
Patient ID: Corey Graham, male    DOB: [birth year]  MRN: [id number]  CC: Pre-op Exam (Pre-op clearance f/u. Nicki Reaper having a cold for the last 2 weeks - taking honey & lemon mixture & cough drops & suspects that it's causing high BS readings)   Subjective: Corey Graham is a 47 y.o. male who presents for chronic ds management.  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 #[id number]  He has form with him from the [facility].  They are wanting presurgical evaluation to allow him to have excisional biopsy of the Maxibar under local anesthesia.  We have been working on trying to get him approved since summer but diabetes and blood pressure remain uncontrolled.  C/o having cough x 2 wks Similar episode 2 mths ago; he was taking some OTC cough syrup.  Resolved but came back 2 wks ago Cough dry but sometimes he brings up phlegm.  Some SOB, using albuterol inhaler in a.m and twice in the evenings -no recent sick contacts.  No recent international travel   DM: Results for orders placed or performed in visit on 11/04/23  POCT glycosylated hemoglobin (Hb A1C)  Result Value Ref Range   Hemoglobin A1C     HbA1c POC (<> result, manual entry)     HbA1c, POC (prediabetic range)     HbA1c, POC (controlled diabetic range) 14.3 (A) 0.0 - 7.0 %  POCT glucose (manual entry)  Result Value Ref Range   POC Glucose 351 (A) 70 - 99 mg/dl  POCT URINALYSIS DIP (CLINITEK)  Result Value Ref Range   Color, UA yellow yellow   Clarity, UA clear clear   Glucose, UA >=1,000 (A) negative mg/dL   Bilirubin, UA negative negative   Ketones, POC UA negative negative mg/dL   Spec Grav, UA 9.518 8.416 - 1.025   Blood, UA negative negative    pH, UA 5.5 5.0 - 8.0   POC PROTEIN,UA =100 (A) negative, trace   Urobilinogen, UA 0.2 0.2 or 1.0 E.U./dL   Nitrite, UA Negative Negative   Leukocytes, UA Negative Negative  A1C continues to go up. Pt should be on Glargine 30 units, Metformin 1 gram BID and jardiance. Not on Jardiance; likely did not qulify through PAP.   Reports he stopped taking his insulin for 1 wk because he was afraid he would not inject self correctly since having his acute respiratory symptoms. -not checking BS. -has glucometer but not checking BS consistently  HTN:  Did not bring meds with him.  should be on Cozaar 50 mg, HCTZ 25 mg and amlodipine 10 mg daily.  Tells me in total he is taking 6 tabs and took already for the morning.    Patient Active Problem List   Diagnosis Date Noted   Leg pain, bilateral 09/03/2023   Asthma, moderate persistent 02/12/2022   Uncircumcised male 08/30/2021   Branch retinal vein occlusion of right eye with macular edema 06/11/2019   Nuclear sclerotic cataract of both eyes 06/11/2019   Retinal edema 06/11/2019   Severe nonproliferative diabetic retinopathy of both eyes with macular edema associated with type 2 diabetes mellitus (HCC) 06/11/2019   Essential hypertension 09/09/2017   Low back pain 06/24/2017   Uncontrolled type 2 diabetes mellitus with  hyperglycemia, without long-term current use of insulin (HCC) 12/25/2016     Current Outpatient Medications on File Prior to Visit  Medication Sig Dispense Refill   Accu-Chek Softclix Lancets lancets Check blood sugar 3 times daily 100 each 12   albuterol (VENTOLIN HFA) 108 (90 Base) MCG/ACT inhaler Inhale 2 puffs into the lungs every 6 (six) hours as needed for wheezing or shortness of breath. 6.7 g 1   amLODipine (NORVASC) 10 MG tablet Take 1 tablet (10 mg total) by mouth daily. 90 tablet 3   Blood Glucose Monitoring Suppl (ONETOUCH VERIO) w/Device KIT Please use to check blood sugar once daily. 1 kit 0   gabapentin (NEURONTIN) 300  MG capsule Take 1 capsule (300 mg total) by mouth at bedtime. 90 capsule 2   glucose blood (ACCU-CHEK GUIDE) test strip Check blood sugars 3 times daily 100 each 12   hydrochlorothiazide (HYDRODIURIL) 25 MG tablet Take 1 tablet (25 mg total) by mouth daily. 90 tablet 3   insulin glargine-yfgn (SEMGLEE, YFGN,) 100 UNIT/ML Pen Inject 30 Units into the skin daily. 15 mL 1   Insulin Pen Needle (PEN NEEDLES) 32G X 4 MM MISC Use to inject insulin once daily. Stop 8 mm needles. 100 each 3   losartan (COZAAR) 50 MG tablet Take 1 tablet (50 mg total) by mouth daily. 90 tablet 1   metFORMIN (GLUCOPHAGE) 1000 MG tablet Take 1 tablet (1,000 mg total) by mouth daily with breakfast. 90 tablet 2   OneTouch Delica Lancets 33G MISC Please use to check blood sugar once daily. 100 each 3   rosuvastatin (CRESTOR) 20 MG tablet Take 1 tablet (20 mg total) by mouth daily. 90 tablet 1   betamethasone dipropionate 0.05 % cream Apply topically 2 (two) times daily. Crema de betametasona (0,05%) aplicada dos veces al da directamente sobre y alrededor de la punta del pene durante cuatro a ocho semanas. 30 g 0   No current facility-administered medications on file prior to visit.    No Known Allergies  Social History   Socioeconomic History   Marital status: Married    Spouse name: Not on file   Number of children: 3   Years of education: Not on file   Highest education level: Not on file  Occupational History   Not on file  Tobacco Use   Smoking status: Never   Smokeless tobacco: Never  Vaping Use   Vaping status: Never Used  Substance and Sexual Activity   Alcohol use: Yes    Comment: infrequent   Drug use: No   Sexual activity: Yes  Other Topics Concern   Not on file  Social History Narrative   [place] at home with wife and 3 children   Social Determinants of Health   Financial Resource Strain: Low Risk  (04/09/2023)   Overall Financial Resource Strain (CARDIA)    Difficulty of Paying Living  Expenses: Not very hard  Food Insecurity: No Food Insecurity (04/09/2023)   Hunger Vital Sign    Worried About Running Out of Food in the Last Year: Never true    Ran Out of Food in the Last Year: Never true  Transportation Needs: No Transportation Needs (04/09/2023)   PRAPARE - Administrator, Civil Service (Medical): No    Lack of Transportation (Non-Medical): No  Physical Activity: Inactive (04/09/2023)   Exercise Vital Sign    Days of Exercise per Week: 0 days    Minutes of Exercise per Session: 0 min  Stress:  No Stress Concern Present (04/09/2023)   [employer] of Occupational Health - Occupational Stress Questionnaire    Feeling of Stress : Only a little  Social Connections: Moderately Isolated (04/09/2023)   Social Connection and Isolation Panel [NHANES]    Frequency of Communication with Friends and Family: More than three times a week    Frequency of Social Gatherings with Friends and Family: More than three times a week    Attends Religious Services: Never    Database administrator or Organizations: No    Attends Banker Meetings: Never    Marital Status: Married  Catering manager Violence: Not At Risk (04/09/2023)   Humiliation, Afraid, Rape, and Kick questionnaire    Fear of Current or Ex-Partner: No    Emotionally Abused: No    Physically Abused: No    Sexually Abused: No    Family History  Problem Relation Age of Onset   Diabetes Mother    High blood pressure Father     Past Surgical History:  Procedure Laterality Date   HERNIA REPAIR      ROS: Review of Systems Negative except as stated above  PHYSICAL EXAM: BP (!) 128/90   Pulse 89   Temp 97.9 F (36.6 C) (Oral)   Ht 5\' 4"  (1.626 m)   Wt 162 lb (73.5 kg)   SpO2 96%   BMI 27.81 kg/m   Wt Readings from Last 3 Encounters:  11/04/23 162 lb (73.5 kg)  09/30/23 167 lb 9.6 oz (76 kg)  09/03/23 166 lb (75.3 kg)    Physical Exam   General appearance - alert, well  appearing, middle-age Hispanic male and in no distress Mental status - normal mood, behavior, speech, dress, motor activity, and thought processes Neck - supple, no significant adenopathy Chest - clear to auscultation, no wheezes, rales or rhonchi, symmetric air entry Heart - normal rate, regular rhythm, normal S1, S2, no murmurs, rubs, clicks or gallops Extremities - peripheral pulses normal, no pedal edema, no clubbing or cyanosis     Latest Ref Rng & Units 09/03/2023   10:43 AM 04/09/2023   10:41 AM 03/14/2023   11:24 AM  CMP  Glucose 70 - 99 mg/dL 469  629  528   BUN 6 - 24 mg/dL 11  13  10    Creatinine 0.76 - 1.27 mg/dL 4.13  2.44  0.10   Sodium 134 - 144 mmol/L 137  137  136   Potassium 3.5 - 5.2 mmol/L 4.2  4.2  4.0   Chloride 96 - 106 mmol/L 99  101  97   CO2 20 - 29 mmol/L 23  19  25    Calcium 8.7 - 10.2 mg/dL 9.2  9.5  9.9    Lipid Panel     Component Value Date/Time   CHOL 258 (H) 12/05/2022 1200   TRIG 208 (H) 12/05/2022 1200   HDL 47 12/05/2022 1200   CHOLHDL 5.5 (H) 12/05/2022 1200   LDLCALC 172 (H) 12/05/2022 1200    CBC    Component Value Date/Time   WBC 8.5 01/24/2023 2252   RBC 4.79 01/24/2023 2252   HGB 14.3 01/24/2023 2252   HGB 16.2 12/05/2022 1200   HCT 42.1 01/24/2023 2252   HCT 48.3 12/05/2022 1200   PLT 196 01/24/2023 2252   PLT 215 12/05/2022 1200   MCV 87.9 01/24/2023 2252   MCV 88 12/05/2022 1200   MCH 29.9 01/24/2023 2252   MCHC 34.0 01/24/2023 2252   RDW  12.1 01/24/2023 2252   RDW 12.5 12/05/2022 1200   LYMPHSABS 3.5 01/24/2023 2252   LYMPHSABS 2.4 12/05/2022 1200   MONOABS 0.5 01/24/2023 2252   EOSABS 0.9 (H) 01/24/2023 2252   EOSABS 0.8 (H) 12/05/2022 1200   BASOSABS 0.1 01/24/2023 2252   BASOSABS 0.2 12/05/2022 1200    ASSESSMENT AND PLAN: 1. Type 2 diabetes mellitus with hyperglycemia, with long-term current use of insulin (HCC) Advised patient that I cannot clear him for surgery until his A1c is significantly improved.   Advised against stopping his insulin during acute respiratory illness.  Recommend restarting glargine 30 units at bedtime. Continue metformin 1 g daily. Add NovoLog insulin 5 units with meals. Encouraged him to check blood sugars if only once a day in the mornings before breakfast with goal being 90-130. Get him back in with the clinical pharmacist in 1 month for recheck.  If blood sugars are improving, we can clear him for surgery. - POCT glycosylated hemoglobin (Hb A1C) - POCT glucose (manual entry) - POCT URINALYSIS DIP (CLINITEK) - insulin aspart (NOVOLOG FLEXPEN) 100 UNIT/ML FlexPen; Inject 5 Units into the skin 3 (three) times daily with meals.  Dispense: 15 mL; Refill: 11  2. Hypertension associated with type 2 diabetes mellitus (HCC) Not at goal.  Continue medications listed above.  Add hydralazine 10 mg twice a day. - hydrALAZINE (APRESOLINE) 10 MG tablet; Take 1 tablet (10 mg total) by mouth in the morning and at bedtime.  Dispense: 180 tablet; Refill: 1  3. Moderate persistent asthma with acute exacerbation Continue albuterol as needed.  Add Symbicort to use as maintenance inhaler.  Advised to wash mouth out after each use. - budesonide-formoterol (SYMBICORT) 80-4.5 MCG/ACT inhaler; Inhale 2 puffs into the lungs 2 (two) times daily.  Dispense: 10.2 g; Refill: 3 - azithromycin (ZITHROMAX Z-PAK) 250 MG tablet; Take 2 tablets (500 mg total) by mouth daily for 1 day, THEN 1 tablet (250 mg total) daily for 4 days.  Dispense: 6 tablet; Refill: 0   Patient was given the opportunity to ask questions.  Patient verbalized understanding of the plan and was able to repeat key elements of the plan.   This documentation was completed using Paediatric nurse.  Any transcriptional errors are unintentional.  Orders Placed This Encounter  Procedures   POCT glycosylated hemoglobin (Hb A1C)   POCT glucose (manual entry)   POCT URINALYSIS DIP (CLINITEK)     Requested  Prescriptions   Signed Prescriptions Disp Refills   pantoprazole (PROTONIX) 40 MG tablet 90 tablet 0    Sig: Take 1 tablet (40 mg total) by mouth daily.   insulin aspart (NOVOLOG FLEXPEN) 100 UNIT/ML FlexPen 15 mL 11    Sig: Inject 5 Units into the skin 3 (three) times daily with meals.   budesonide-formoterol (SYMBICORT) 80-4.5 MCG/ACT inhaler 10.2 g 3    Sig: Inhale 2 puffs into the lungs 2 (two) times daily.   azithromycin (ZITHROMAX Z-PAK) 250 MG tablet 6 tablet 0    Sig: Take 2 tablets (500 mg total) by mouth daily for 1 day, THEN 1 tablet (250 mg total) daily for 4 days.   hydrALAZINE (APRESOLINE) 10 MG tablet 180 tablet 1    Sig: Take 1 tablet (10 mg total) by mouth in the morning and at bedtime.    Return in about 3 months (around 02/04/2024) for 4 weeks with clinical pharmacist for DM.  Jonah Blue, MD, FACP

## 2023-11-05 ENCOUNTER — Other Ambulatory Visit: Payer: Self-pay

## 2023-11-15 ENCOUNTER — Other Ambulatory Visit: Payer: Self-pay

## 2023-12-03 ENCOUNTER — Ambulatory Visit: Payer: Self-pay | Admitting: Pharmacist

## 2023-12-23 ENCOUNTER — Other Ambulatory Visit: Payer: Self-pay

## 2023-12-24 ENCOUNTER — Other Ambulatory Visit: Payer: Self-pay

## 2024-01-02 ENCOUNTER — Other Ambulatory Visit: Payer: Self-pay

## 2024-01-30 IMAGING — CR DG CHEST 2V
3 series · 3 of 3 positions shown · non-contrast
Comparison: 10/28/2015

CLINICAL DATA: Shortness of breath.  History of viral bronchiolitis

EXAM:
CHEST - 2 VIEW

[w chest pa (1 of 2)]
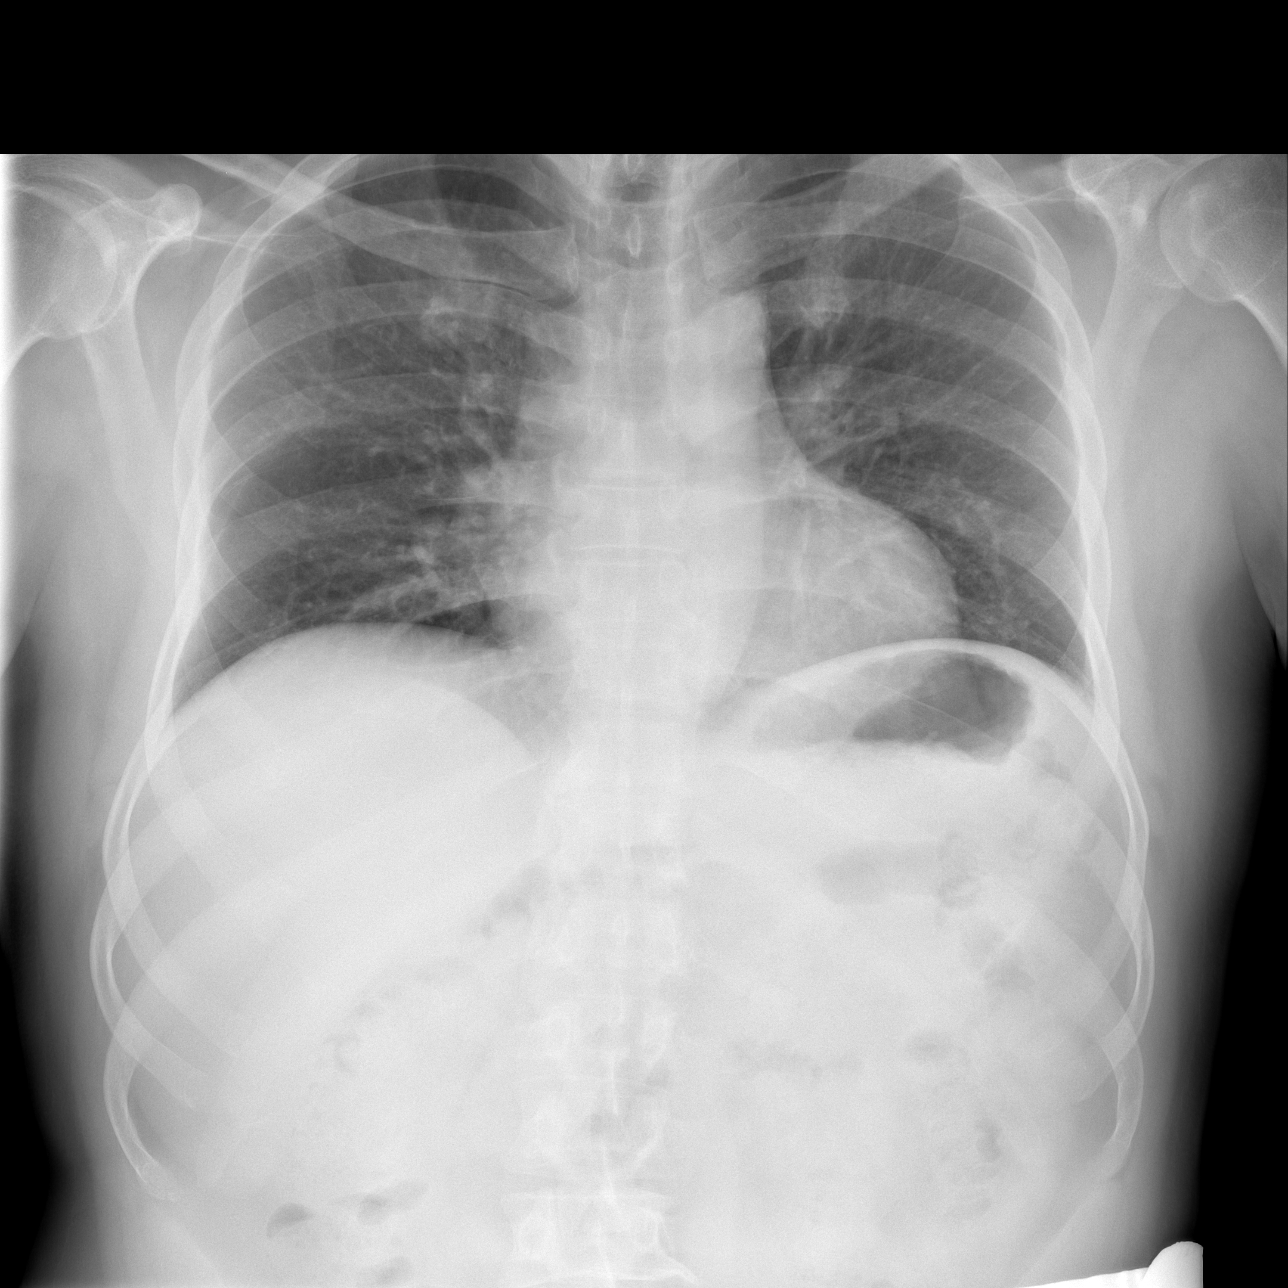

[w chest pa (2 of 2)]
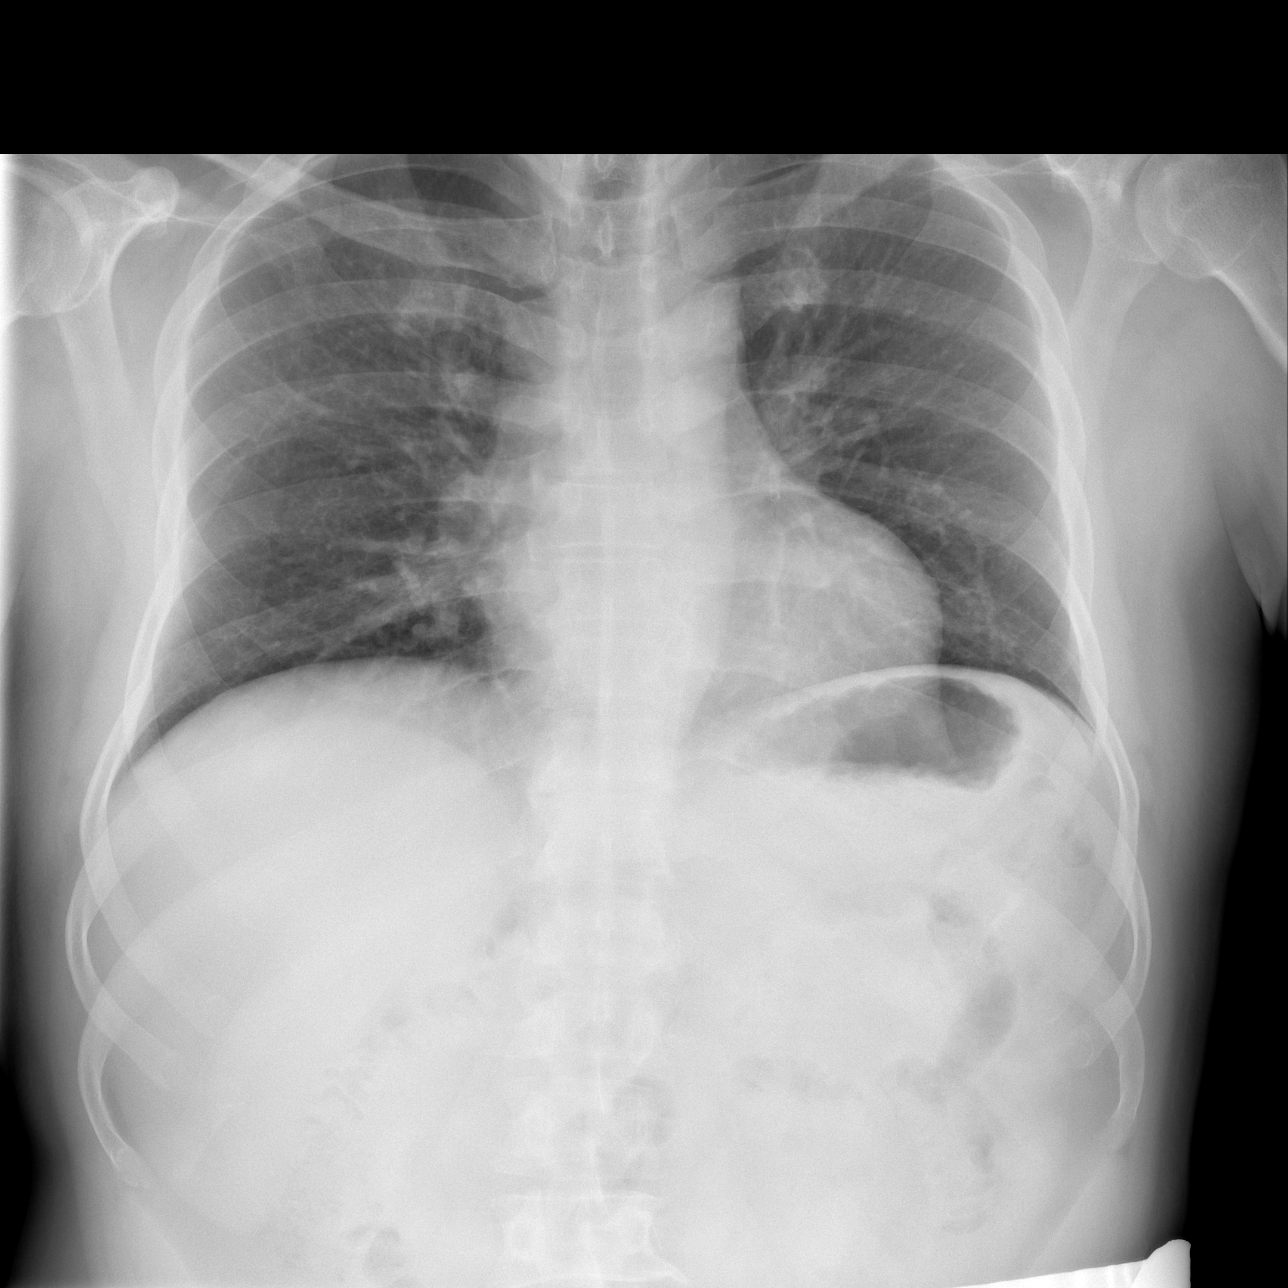

[w chest lat]
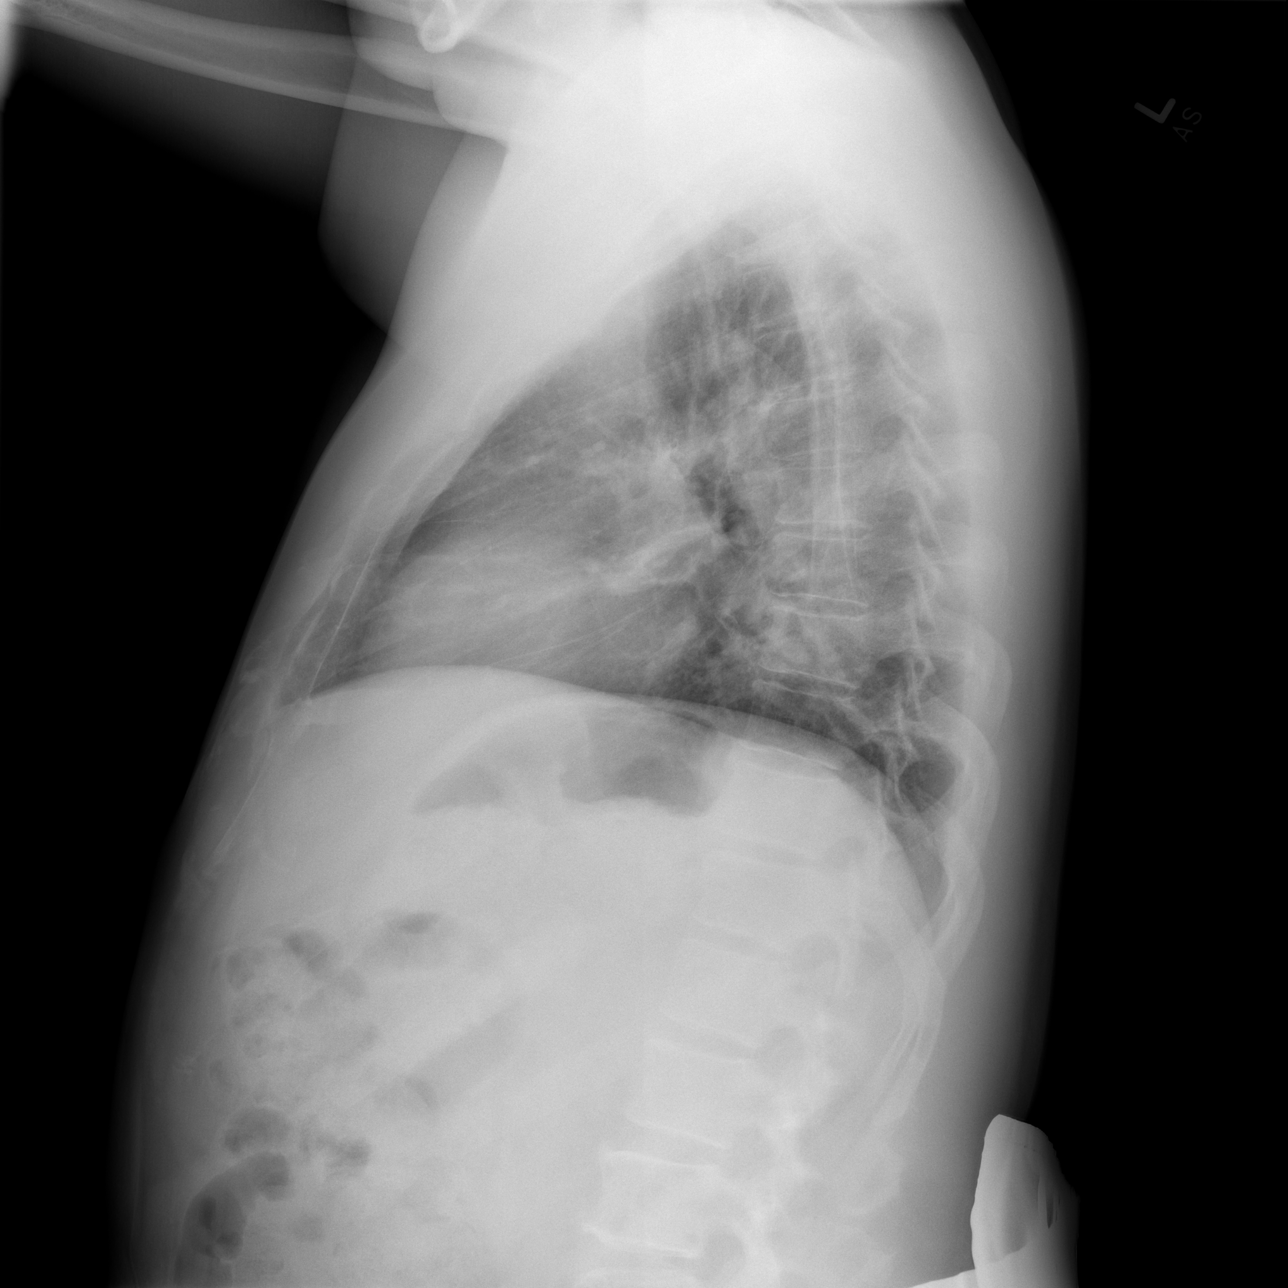

[3 of 3 positions shown; findings below may reference images not displayed]

FINDINGS: Heart and mediastinal contours are within normal limits. No focal
opacities or effusions. No acute bony abnormality.
IMPRESSION: No active cardiopulmonary disease.

## 2024-02-04 ENCOUNTER — Ambulatory Visit: Payer: Self-pay | Admitting: Internal Medicine

## 2024-02-18 ENCOUNTER — Telehealth: Payer: Self-pay

## 2024-02-18 NOTE — Telephone Encounter (Signed)
 Copied from CRM [id number]. Topic: Appointments - Appointment Scheduling >> Feb 18, 2024  1:16 PM Corey Graham wrote: Patient needs appt for the [facility].   Please transfer to [facility] for scheduling.

## 2024-03-09 ENCOUNTER — Other Ambulatory Visit: Payer: Self-pay

## 2024-03-19 ENCOUNTER — Other Ambulatory Visit: Payer: Self-pay

## 2024-06-29 ENCOUNTER — Ambulatory Visit: Payer: Self-pay | Attending: Internal Medicine | Admitting: Internal Medicine

## 2024-06-29 ENCOUNTER — Encounter: Payer: Self-pay | Admitting: Internal Medicine

## 2024-06-29 ENCOUNTER — Other Ambulatory Visit: Payer: Self-pay

## 2024-06-29 VITALS — BP 160/97 | HR 88 | Temp 98.1°F | Resp 12 | Ht 64.0 in | Wt 165.2 lb

## 2024-06-29 DIAGNOSIS — E1169 Type 2 diabetes mellitus with other specified complication: Secondary | ICD-10-CM

## 2024-06-29 DIAGNOSIS — Z794 Long term (current) use of insulin: Secondary | ICD-10-CM

## 2024-06-29 DIAGNOSIS — E1159 Type 2 diabetes mellitus with other circulatory complications: Secondary | ICD-10-CM

## 2024-06-29 DIAGNOSIS — I10 Essential (primary) hypertension: Secondary | ICD-10-CM

## 2024-06-29 DIAGNOSIS — Z7984 Long term (current) use of oral hypoglycemic drugs: Secondary | ICD-10-CM

## 2024-06-29 DIAGNOSIS — I152 Hypertension secondary to endocrine disorders: Secondary | ICD-10-CM

## 2024-06-29 DIAGNOSIS — R748 Abnormal levels of other serum enzymes: Secondary | ICD-10-CM

## 2024-06-29 DIAGNOSIS — E785 Hyperlipidemia, unspecified: Secondary | ICD-10-CM

## 2024-06-29 DIAGNOSIS — E1165 Type 2 diabetes mellitus with hyperglycemia: Secondary | ICD-10-CM

## 2024-06-29 DIAGNOSIS — Z23 Encounter for immunization: Secondary | ICD-10-CM

## 2024-06-29 LAB — POCT URINE DIPSTICK
Bilirubin, UA: NEGATIVE
Blood, UA: NEGATIVE
Glucose, UA: 500 mg/dL — AB
Ketones, POC UA: NEGATIVE mg/dL
Leukocytes, UA: NEGATIVE
Nitrite, UA: NEGATIVE
POC PROTEIN,UA: 30 — AB
Spec Grav, UA: 1.015 (ref 1.010–1.025)
Urobilinogen, UA: 0.2 U/dL
pH, UA: 7 (ref 5.0–8.0)

## 2024-06-29 LAB — POCT GLYCOSYLATED HEMOGLOBIN (HGB A1C): HbA1c, POC (controlled diabetic range): 14.4 % — AB (ref 0.0–7.0)

## 2024-06-29 LAB — GLUCOSE, POCT (MANUAL RESULT ENTRY): POC Glucose: 543 mg/dL — AB (ref 70–99)

## 2024-06-29 MED ORDER — METFORMIN HCL 500 MG PO TABS
500.0000 mg | ORAL_TABLET | Freq: Two times a day (BID) | ORAL | 6 refills | Status: AC
  Filled 2024-06-29: qty 60, 30d supply, fill #0

## 2024-06-29 MED ORDER — AMLODIPINE BESYLATE 10 MG PO TABS
10.0000 mg | ORAL_TABLET | Freq: Every day | ORAL | 6 refills | Status: AC
  Filled 2024-06-29: qty 30, 30d supply, fill #0

## 2024-06-29 MED ORDER — TRUE METRIX METER W/DEVICE KIT
PACK | 0 refills | Status: AC
Start: 2024-06-29 — End: ?
  Filled 2024-06-29: qty 1, fill #0

## 2024-06-29 MED ORDER — LOSARTAN POTASSIUM 50 MG PO TABS
50.0000 mg | ORAL_TABLET | Freq: Every day | ORAL | 6 refills | Status: AC
  Filled 2024-06-29: qty 30, 30d supply, fill #0

## 2024-06-29 MED ORDER — INSULIN GLARGINE-YFGN 100 UNIT/ML ~~LOC~~ SOPN
30.0000 [IU] | PEN_INJECTOR | Freq: Every day | SUBCUTANEOUS | 1 refills | Status: DC
Start: 2024-06-29 — End: 2024-06-29
  Filled 2024-06-29: qty 15, 50d supply, fill #0

## 2024-06-29 MED ORDER — INSULIN ASPART 100 UNIT/ML IJ SOLN
6.0000 [IU] | Freq: Once | INTRAMUSCULAR | Status: AC
  Administered 2024-06-29: 6 [IU] via SUBCUTANEOUS

## 2024-06-29 MED ORDER — TRUEPLUS LANCETS 28G MISC
6 refills | Status: AC
  Filled 2024-06-29: qty 100, 30d supply, fill #0

## 2024-06-29 MED ORDER — TRUE METRIX BLOOD GLUCOSE TEST VI STRP
ORAL_STRIP | 6 refills | Status: AC
  Filled 2024-06-29: qty 100, fill #0

## 2024-06-29 MED ORDER — ROSUVASTATIN CALCIUM 20 MG PO TABS
20.0000 mg | ORAL_TABLET | Freq: Every day | ORAL | 6 refills | Status: DC
  Filled 2024-06-29: qty 30, 30d supply, fill #0

## 2024-06-29 MED ORDER — INSULIN LISPRO (1 UNIT DIAL) 100 UNIT/ML (KWIKPEN)
5.0000 [IU] | PEN_INJECTOR | Freq: Three times a day (TID) | SUBCUTANEOUS | 2 refills | Status: AC
  Filled 2024-06-29: qty 6, 40d supply, fill #0

## 2024-06-29 MED ORDER — NOVOLOG FLEXPEN 100 UNIT/ML ~~LOC~~ SOPN
5.0000 [IU] | PEN_INJECTOR | Freq: Three times a day (TID) | SUBCUTANEOUS | 11 refills | Status: DC
  Filled 2024-06-29: qty 15, 100d supply, fill #0

## 2024-06-29 MED ORDER — TRUE METRIX METER W/DEVICE KIT
PACK | 0 refills | Status: AC
  Filled 2024-06-29: qty 1, 30d supply, fill #0

## 2024-06-29 MED ORDER — TRUE METRIX BLOOD GLUCOSE TEST VI STRP
ORAL_STRIP | 12 refills | Status: AC
  Filled 2024-06-29: qty 100, fill #0
  Filled 2024-06-29: qty 100, 30d supply, fill #0

## 2024-06-29 MED ORDER — HYDROCHLOROTHIAZIDE 25 MG PO TABS
25.0000 mg | ORAL_TABLET | Freq: Every day | ORAL | 6 refills | Status: AC
  Filled 2024-06-29: qty 30, 30d supply, fill #0

## 2024-06-29 MED ORDER — BASAGLAR KWIKPEN 100 UNIT/ML ~~LOC~~ SOPN
30.0000 [IU] | PEN_INJECTOR | Freq: Every day | SUBCUTANEOUS | 2 refills | Status: AC
  Filled 2024-06-29: qty 9, 30d supply, fill #0

## 2024-06-29 NOTE — Patient Instructions (Signed)
 Alimentacin saludable en los [employer], Adult Una alimentacin saludable puede ayudarlo a Barista y Pharmacologist un peso saludable, reducir el riesgo de tener enfermedades crnicas y vivir Neomia Dear vida larga y productiva. Es importante que siga una modalidad de alimentacin saludable. Sus necesidades nutricionales y calricas deben satisfacerse principalmente con distintos alimentos ricos en nutrientes. Consejos para seguir Surveyor, minerals Lea las etiquetas de los alimentos Lea las etiquetas y elija las que digan lo siguiente: Productos reducidos en sodio o con bajo contenido de [place]. Jugos con 100 % jugo de fruta. Alimentos con bajo contenido de grasas saturadas (menos de 3 g por porcin) y alto contenido de grasas poliinsaturadas y Mining engineer. Alimentos con cereales integrales, como trigo integral, trigo partido, arroz integral y arroz salvaje. Cereales integrales fortificados con cido flico. Esto se recomienda a las mujeres embarazadas o que desean quedar embarazadas. Lea las etiquetas y no coma ni beba lo siguiente: Alimentos o bebidas con azcar agregada. Estos incluyen los alimentos que contienen azcar moreno, endulzante a base de maz, jarabe de maz, dextrosa, fructosa, glucosa, jarabe de maz de alta fructosa, miel, azcar invertido, lactosa, jarabe de [place], maltosa, Sunflower, azcar sin refinar, sacarosa, trehalosa y azcar turbinado. Limite el consumo de azcar agregada a menos del 10 % del total de caloras diarias. No consuma ms que las siguientes cantidades de azcar agregada por da: 6 cucharaditas (25 g) para las mujeres. 9 cucharaditas (38 g) para los hombres. Los alimentos que contienen almidones y cereales refinados o procesados. Los productos de cereales refinados, como harina blanca, harina de maz desgerminada, pan blanco y arroz blanco. Al ir de compras Elija refrigerios ricos en nutrientes, como verduras, frutas enteras y frutos secos. Evite los refrigerios con  alto contenido de caloras y International aid/development worker, como las papas fritas, los refrigerios frutales y los caramelos. Use alios y productos para untar a base de aceite con los Publishing rights manager de grasas slidas como la [place], la [place], la crema agria o el queso crema. Limite las salsas, las mezclas y los productos "instantneos" preelaborados como el arroz saborizado, los fideos instantneos y las pastas listas para comer. Pruebe ms fuentes de protena vegetal, como tofu, tempeh, frijoles negros, edamame, lentejas, frutos secos y semillas. Explore planes de alimentacin como la dieta mediterrnea o la dieta vegetariana. Pruebe salsas cardiosaludables hechas con frijoles y grasas saludables, como hummus y [place]. Las verduras van muy bien con ellas. Al cocinar Use aceite para Designer, multimedia de grasas slidas como [place], margarina o [place] de [place]. En lugar de frer, trate de cocinar en el horno, en la plancha o en la parrilla, o hervir los alimentos. Retire la parte grasa de las carnes antes de cocinarlas. Cocine las verduras al vapor en agua o caldo. Planificacin de las comidas  En las comidas, imagine dividir su plato en cuartos: La mitad del plato tiene frutas y verduras. Un cuarto del plato tiene cereales integrales. Un cuarto del plato tiene protena, especialmente carnes [place], aves, huevos, tofu, frijoles o frutos secos. Incluya lcteos descremados en su dieta diaria. Estilo de vida Elija opciones saludables en todos los mbitos, como en el hogar, el [place], la [place], los restaurantes y [place]. Prepare los alimentos de un modo seguro: Lvese las manos despus de manipular carnes crudas. Donde prepare alimentos, mantenga las superficies limpias lavndolas regularmente con agua caliente y [place]. Mantenga las carnes crudas separadas de los alimentos que estn listos para comer como las frutas y las verduras. Cocine los frutos  de mar, carnes, aves y Loss adjuster, chartered la temperatura recomendada. Consiga un termmetro para alimentos. Almacene los alimentos a temperaturas seguras. En general: Mantenga los alimentos fros a una temperatura de 40 F (4,4 C) o inferior. Mantenga los alimentos calientes a una temperatura de 140 F (60 C) o superior. Mantenga el congelador a una temperatura de 0 F (-17,8 C) o inferior. Los alimentos no son seguros para su consumo cuando han estado a una temperatura de entre 40 y 140 F (4.4 y 60 C) por ms de 2 horas. Qu alimentos debo comer? Frutas Propngase comer entre 1 y 2 tazas de frutas frescas, Primary school teacher (en su jugo natural) o Primary school teacher. Una taza de fruta equivale a 1 manzana pequea, 1 banana grande, 8 fresas grandes, 1 taza (237 g) de fruta enlatada,  taza (82 g) de fruta seca o 1 taza (240 ml) de jugo al 100 %. Verduras Propngase comer de 2 a 4 tazas de verduras frescas y Primary school teacher, incluyendo diferentes variedades y colores. Una taza de verduras equivale a 1 taza (91 g) de brcoli o coliflor, 2 zanahorias medianas, 2 tazas (150 g) de verduras de [employer] crudas, 1 tomate grande, 1 pimiento morrn grande, 1 batata grande o 1 patata blanca mediana. Cereales Propngase comer el equivalente a entre 4 y 10 onzas de cereales integrales por Futures trader. Algunos ejemplos de equivalentes a 1 onza de cereales son 1 rebanada de pan, 1 taza (40 g) de cereal listo para comer, 3 tazas (24 g) de palomitas de maz o  taza (93 g) de arroz cocido. Carnes y otras protenas Propngase comer el equivalente a entre 5 y 7  onzas de protena por Futures trader. Algunos ejemplos de equivalentes a 1 onza de protenas incluyen 1 huevo,  oz de frutos secos (12 almendras, 24 pistachos o 7 mitades de nueces), 1/4 taza (90 g) de frijoles cocidos, 6 cucharadas (90 g) de hummus o 1 cucharada (16 g) de [place] de man. Un corte de carne o pescado del tamao de un mazo de cartas equivale aproximadamente a 3 a 4 onzas (85  g). De las protenas que consume cada semana, intente que al menos 8 onzas (227 g) sean frutos de mar. Esto equivale a unas 2 porciones por semana. Esto incluye salmn, trucha, arenque y anchoas. Lcteos [employer] a 3 tazas de lcteos descremados o con bajo contenido de Museum/gallery curator. Algunos ejemplos de equivalentes a 1 taza de lcteos son 1 taza (240 ml) de leche, 8 onzas (250 g) de yogur, 1 onzas (44 g) de queso natural o 1 taza (240 ml) de leche de soja fortificada. Grasas y aceites Propngase consumir alrededor de 5 cucharaditas (21 g) de grasas y Acupuncturist. Elija grasas monoinsaturadas, como el aceite de canola y de [place], la [place] con aceite de [place] o de [place], [place], [place] de man y Games developer de los frutos secos, o bien grasas poliinsaturadas, como el aceite de [place], maz y soja, nueces, piones, semillas de ssamo, semillas de girasol y semillas de lino. Bebidas Propngase beber 6 vasos de 8 onzas de Warehouse manager. Limite el caf a entre 3 y 5 tazas de ocho onzas por Futures trader. Limite el consumo de bebidas con cafena que tengan caloras agregadas, como los refrescos y las bebidas energizantes. Si bebe alcohol: Limite la cantidad que bebe a lo siguiente: De 0 a 1 medida al da si es [place]. De 0 a 2 medidas  al da si es varn. Sepa cunta cantidad de alcohol hay en las bebidas que toma. En los [address], una medida es una botella de cerveza de 12 oz (355 ml), un vaso de vino de 5 oz (148 ml) o un vaso de una bebida alcohlica de alta graduacin de 1 oz (44 ml). Condimentos y otros alimentos Trate de no agregar demasiada sal a los alimentos. Trate de usar hierbas y especias en lugar de sal. Trate de no agregar azcar a los alimentos. Esta informacin se basa en las pautas de nutricin de los [place]. Para obtener ms informacin, visite [email or web address]. Las Information systems manager. Es posible que necesite cantidades  diferentes. Esta informacin no tiene Theme park manager el consejo del mdico. Asegrese de hacerle al mdico cualquier pregunta que tenga. Document Revised: 10/02/2022 Document Reviewed: 10/02/2022 Elsevier Patient Education  2024 [employer].

## 2024-06-29 NOTE — Progress Notes (Signed)
 Patient ID: Corey Graham, male    DOB: Feb 28, 1976  MRN: [id number]  CC: Follow-up   Subjective: Corey Graham is a 48 y.o. male who presents for chronic ds management. 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 #Corey Graham, Corey Graham   Discussed the use of AI scribe software for clinical note transcription with the patient, who gave verbal consent to proceed.  History of Present Illness Corey Graham is a 48 year old male with diabetes and hypertension who presents for medication management and follow-up.  DM: Results for orders placed or performed in visit on 06/29/24  HgB A1c   Collection Time: 06/29/24  2:17 PM  Result Value Ref Range   Hemoglobin A1C     HbA1c POC (<> result, manual entry)     HbA1c, POC (prediabetic range)     HbA1c, POC (controlled diabetic range) 14.4 (A) 0.0 - 7.0 %  Glucose (CBG)   Collection Time: 06/29/24  2:58 PM  Result Value Ref Range   POC Glucose 543 (A) 70 - 99 mg/dl  POCT URINE DIPSTICK   Collection Time: 06/29/24  2:58 PM  Result Value Ref Range   Color, UA yellow yellow   Clarity, UA clear clear   Glucose, UA =500 (A) negative mg/dL   Bilirubin, UA negative negative   Ketones, POC UA negative negative mg/dL   Spec Grav, UA 8.984 8.989 - 1.025   Blood, UA negative negative   pH, UA 7.0 5.0 - 8.0   POC PROTEIN,UA =30 (A) negative, trace   Urobilinogen, UA 0.2 0.2 or 1.0 E.U./dL   Nitrite, UA Negative Negative   Leukocytes, UA Negative Negative  He has been off all his diabetes and hypertension medications for approximately three months due to financial constraints. Previously, he was on glargine insulin  once daily at 30 units and NovoLog  5 units three times a day with meals.  He was also  supposed to be on metformin  1 g daily.  He has not been able to check his blood sugars as his glucose meter is broken. He experiences frequent urination and increased water intake, especially while working. No blurred vision, chest pain, shortness of breath, or leg swelling. He notes blurred vision in his right eye when his blood sugar is low, which he believes is related to an abnormal optic nerve.  Hypertension: he was on amlodipine  10 mg, losartan  50 mg, and hydrochlorothiazide , but has not taken these medications for the past three months. No current symptoms related to his blood pressure.  HL: Previously he was on rosuvastatin  20 mg daily.  Asthma: He was previously on Symbicort  and Albuterol .He has only one inhaler left and uses it infrequently, primarily when the weather is cold. No recent breathing issues.  He has not seen an eye doctor in two years and does not have medical insurance, relying on an 'orange card' for support.    Patient Active Problem List   Diagnosis Date Noted   Leg pain, bilateral 09/03/2023   Asthma, moderate persistent 02/12/2022   Uncircumcised male 08/30/2021   Branch retinal vein occlusion of right eye with macular edema 06/11/2019   Nuclear sclerotic cataract of both eyes 06/11/2019   Retinal edema 06/11/2019   Severe nonproliferative diabetic retinopathy of both eyes with macular edema  associated with type 2 diabetes mellitus (HCC) 06/11/2019   Essential hypertension 09/09/2017   Low back pain 06/24/2017   Uncontrolled type 2 diabetes mellitus with hyperglycemia, without long-term current use of insulin  (HCC) 12/25/2016     Current Outpatient Medications on File Prior to Visit  Medication Sig Dispense Refill   albuterol  (VENTOLIN  HFA) 108 (90 Base) MCG/ACT inhaler Inhale 2 puffs into the lungs every 6 (six) hours as needed for wheezing or shortness of breath. 6.7 g 1   betamethasone  dipropionate 0.05 % cream Apply topically 2 (two) times daily. Crema de  betametasona (0,05%) aplicada dos veces al da directamente sobre y alrededor de la punta del pene durante cuatro a ocho semanas. 30 g 0   budesonide -formoterol  (SYMBICORT ) 80-4.5 MCG/ACT inhaler Inhale 2 puffs into the lungs 2 (two) times daily. 10.2 g 3   gabapentin  (NEURONTIN ) 300 MG capsule Take 1 capsule (300 mg total) by mouth at bedtime. 90 capsule 2   Insulin  Pen Needle (PEN NEEDLES) 32G X 4 MM MISC Use to inject insulin  once daily. Stop 8 mm needles. 100 each 3   pantoprazole  (PROTONIX ) 40 MG tablet Take 1 tablet (40 mg total) by mouth daily. 90 tablet 0   No current facility-administered medications on file prior to visit.    No Known Allergies  Social History   Socioeconomic History   Marital status: Married    Spouse name: Not on file   Number of children: 3   Years of education: Not on file   Highest education level: Not on file  Occupational History   Not on file  Tobacco Use   Smoking status: Never   Smokeless tobacco: Never  Vaping Use   Vaping status: Never Used  Substance and Sexual Activity   Alcohol use: Yes    Comment: infrequent   Drug use: No   Sexual activity: Yes  Other Topics Concern   Not on file  Social History Narrative   LIves at home with wife and 3 children   Social Drivers of Health   Financial Resource Strain: Low Risk  (04/09/2023)   Overall Financial Resource Strain (CARDIA)    Difficulty of Paying Living Expenses: Not very hard  Food Insecurity: No Food Insecurity (04/09/2023)   Hunger Vital Sign    Worried About Running Out of Food in the Last Year: Never true    Ran Out of Food in the Last Year: Never true  Transportation Needs: No Transportation Needs (04/09/2023)   PRAPARE - Administrator, Civil Service (Medical): No    Lack of Transportation (Non-Medical): No  Physical Activity: Inactive (04/09/2023)   Exercise Vital Sign    Days of Exercise per Week: 0 days    Minutes of Exercise per Session: 0 min  Stress: No  Stress Concern Present (04/09/2023)   [employer] of Occupational Health - Occupational Stress Questionnaire    Feeling of Stress : Only a little  Social Connections: Moderately Isolated (04/09/2023)   Social Connection and Isolation Panel    Frequency of Communication with Friends and Family: More than three times a week    Frequency of Social Gatherings with Friends and Family: More than three times a week    Attends Religious Services: Never    Database administrator or Organizations: No    Attends Banker Meetings: Never    Marital Status: Married  Catering manager Violence: Not At Risk (04/09/2023)   Humiliation, Afraid, Rape, and Kick questionnaire  Fear of Current or Ex-Partner: No    Emotionally Abused: No    Physically Abused: No    Sexually Abused: No    Family History  Problem Relation Age of Onset   Diabetes Mother    High blood pressure Father     Past Surgical History:  Procedure Laterality Date   HERNIA REPAIR      ROS: Review of Systems Negative except as stated above  PHYSICAL EXAM: BP (!) 160/97 (BP Location: Left Arm, Patient Position: Sitting, Cuff Size: Normal)   Pulse 88   Temp 98.1 F (36.7 C) (Oral)   Resp 12   Ht 5' 4 (1.626 m)   Wt 165 lb 3.2 oz (74.9 kg)   SpO2 99%   BMI 28.36 kg/m   Physical Exam General appearance - alert, well appearing, and in no distress Mental status - normal mood, behavior, speech, dress, motor activity, and thought processes Chest - clear to auscultation, no wheezes, rales or rhonchi, symmetric air entry Heart - normal rate, regular rhythm, normal S1, S2, no murmurs, rubs, clicks or gallops Extremities - peripheral pulses normal, no pedal edema, no clubbing or cyanosis Diabetic Foot Exam - Simple   Simple Foot Form Diabetic Foot exam was performed with the following findings: Yes 06/29/2024  2:44 PM  Visual Inspection No deformities, no ulcerations, no other skin breakdown bilaterally:  Yes Sensation Testing Intact to touch and monofilament testing bilaterally: Yes Pulse Check Posterior Tibialis and Dorsalis pulse intact bilaterally: Yes Comments        Latest Ref Rng & Units 09/03/2023   10:43 AM 04/09/2023   10:41 AM 03/14/2023   11:24 AM  CMP  Glucose 70 - 99 mg/dL 751  707  824   BUN 6 - 24 mg/dL 11  13  10    Creatinine 0.76 - 1.27 mg/dL 9.35  9.36  9.30   Sodium 134 - 144 mmol/L 137  137  136   Potassium 3.5 - 5.2 mmol/L 4.2  4.2  4.0   Chloride 96 - 106 mmol/L 99  101  97   CO2 20 - 29 mmol/L 23  19  25    Calcium  8.7 - 10.2 mg/dL 9.2  9.5  9.9    Lipid Panel     Component Value Date/Time   CHOL 258 (H) 12/05/2022 1200   TRIG 208 (H) 12/05/2022 1200   HDL 47 12/05/2022 1200   CHOLHDL 5.5 (H) 12/05/2022 1200   LDLCALC 172 (H) 12/05/2022 1200    CBC    Component Value Date/Time   WBC 8.5 01/24/2023 2252   RBC 4.79 01/24/2023 2252   HGB 14.3 01/24/2023 2252   HGB 16.2 12/05/2022 1200   HCT 42.1 01/24/2023 2252   HCT 48.3 12/05/2022 1200   PLT 196 01/24/2023 2252   PLT 215 12/05/2022 1200   MCV 87.9 01/24/2023 2252   MCV 88 12/05/2022 1200   MCH 29.9 01/24/2023 2252   MCHC 34.0 01/24/2023 2252   RDW 12.1 01/24/2023 2252   RDW 12.5 12/05/2022 1200   LYMPHSABS 3.5 01/24/2023 2252   LYMPHSABS 2.4 12/05/2022 1200   MONOABS 0.5 01/24/2023 2252   EOSABS 0.9 (H) 01/24/2023 2252   EOSABS 0.8 (H) 12/05/2022 1200   BASOSABS 0.1 01/24/2023 2252   BASOSABS 0.2 12/05/2022 1200    ASSESSMENT AND PLAN: 1. Type 2 diabetes mellitus with hyperglycemia, with long-term current use of insulin  (HCC) (Primary) Uncontrolled due to being out of medications for 3 months.  Patient has  difficulty paying for his medicines due to financial constraints.  I was able to speak with the clinical pharmacist.  We are able to get him Humalog  and glargine insulin  without additional cost.  For his other medicines including metformin , they may be able to do discounts or get for  free as well. Patient to apply for the orange card/cone discount. We will put him on glargine insulin  30 units daily and Humalog  5 units with meals.  Discussed and encourage healthy eating habits. -Prescription sent for diabetic testing supplies.  Advised to check blood sugars at least twice a day before meals with goal being 90-130.  Will have him see our clinical pharmacist in 1 month and then follow-up with me in 2 months. - HgB A1c - CBC - Comprehensive metabolic panel with GFR - Microalbumin / creatinine urine ratio - Blood Glucose Monitoring Suppl (TRUE METRIX METER) w/Device KIT; Use as directed to check blood sugars twice a day.  Dispense: 1 kit; Refill: 0 - glucose blood (TRUE METRIX BLOOD GLUCOSE TEST) test strip; Use as instructed to check blood sugar twice a day  Dispense: 100 each; Refill: 12 - metFORMIN  (GLUCOPHAGE ) 500 MG tablet; Take 1 tablet (500 mg total) by mouth 2 (two) times daily with a meal.  Dispense: 60 tablet; Refill: 6 - Glucose (CBG) - POCT URINE DIPSTICK - insulin  aspart (novoLOG ) injection 6 Units  2. Hypertension associated with type 2 diabetes mellitus (HCC) Not at goal.  He has been out of his blood pressure medications.  Refill sent.  DASH diet encouraged. - amLODipine  (NORVASC ) 10 MG tablet; Take 1 tablet (10 mg total) by mouth daily.  Dispense: 30 tablet; Refill: 6 - hydrochlorothiazide  (HYDRODIURIL ) 25 MG tablet; Take 1 tablet (25 mg total) by mouth daily.  Dispense: 30 tablet; Refill: 6 - losartan  (COZAAR ) 50 MG tablet; Take 1 tablet (50 mg total) by mouth daily.  Dispense: 30 tablet; Refill: 6  3. Hyperlipidemia associated with type 2 diabetes mellitus (HCC) - Lipid panel - rosuvastatin  (CRESTOR ) 20 MG tablet; Take 1 tablet (20 mg total) by mouth daily.  Dispense: 30 tablet; Refill: 6  4. Need for vaccination against Streptococcus pneumoniae - Pneumococcal conjugate vaccine 20-valent  Patient was given the opportunity to ask questions.  Patient  verbalized understanding of the plan and was able to repeat key elements of the plan.   This documentation was completed using Paediatric nurse.  Any transcriptional errors are unintentional.  Orders Placed This Encounter  Procedures   Pneumococcal conjugate vaccine 20-valent   CBC   Comprehensive metabolic panel with GFR   Lipid panel   Microalbumin / creatinine urine ratio   HgB A1c   Glucose (CBG)   POCT URINE DIPSTICK     Requested Prescriptions   Signed Prescriptions Disp Refills   amLODipine  (NORVASC ) 10 MG tablet 30 tablet 6    Sig: Take 1 tablet (10 mg total) by mouth daily.   hydrochlorothiazide  (HYDRODIURIL ) 25 MG tablet 30 tablet 6    Sig: Take 1 tablet (25 mg total) by mouth daily.   losartan  (COZAAR ) 50 MG tablet 30 tablet 6    Sig: Take 1 tablet (50 mg total) by mouth daily.   rosuvastatin  (CRESTOR ) 20 MG tablet 30 tablet 6    Sig: Take 1 tablet (20 mg total) by mouth daily.   Blood Glucose Monitoring Suppl (TRUE METRIX METER) w/Device KIT 1 kit 0    Sig: Use as directed to check blood sugars twice a day.  glucose blood (TRUE METRIX BLOOD GLUCOSE TEST) test strip 100 each 12    Sig: Use as instructed to check blood sugar twice a day   metFORMIN  (GLUCOPHAGE ) 500 MG tablet 60 tablet 6    Sig: Take 1 tablet (500 mg total) by mouth 2 (two) times daily with a meal.   Insulin  Glargine (BASAGLAR  KWIKPEN) 100 UNIT/ML 9 mL 2    Sig: Inject 30 Units into the skin daily.   insulin  lispro (HUMALOG  KWIKPEN) 100 UNIT/ML KwikPen 6 mL 2    Sig: Inject 5 Units into the skin 3 (three) times daily.   TRUEplus Lancets 28G MISC 100 each 6    Sig: Use to check blood sugar 3 times daily.   Blood Glucose Monitoring Suppl (TRUE METRIX METER) w/Device KIT 1 kit 0    Sig: Use to check blood sugar 3 times daily.   glucose blood (TRUE METRIX BLOOD GLUCOSE TEST) test strip 100 each 6    Sig: Use to check blood sugar 3 times daily.    Return in about 2 months (around  08/30/2024) for 4 weeks with clinical pharmacist for DM.  Barnie Louder, MD, FACP

## 2024-06-30 ENCOUNTER — Telehealth: Payer: Self-pay

## 2024-06-30 NOTE — Telephone Encounter (Signed)
 Call received for E2C2.

## 2024-07-01 ENCOUNTER — Telehealth: Payer: Self-pay | Admitting: Internal Medicine

## 2024-07-01 ENCOUNTER — Other Ambulatory Visit: Payer: Self-pay

## 2024-07-01 ENCOUNTER — Ambulatory Visit: Payer: Self-pay | Admitting: Internal Medicine

## 2024-07-01 DIAGNOSIS — E1165 Type 2 diabetes mellitus with hyperglycemia: Secondary | ICD-10-CM

## 2024-07-01 DIAGNOSIS — R748 Abnormal levels of other serum enzymes: Secondary | ICD-10-CM

## 2024-07-01 MED ORDER — GEMFIBROZIL 600 MG PO TABS
600.0000 mg | ORAL_TABLET | Freq: Two times a day (BID) | ORAL | 1 refills | Status: AC
Start: 2024-07-01 — End: ?
  Filled 2024-07-01: qty 60, 30d supply, fill #0

## 2024-07-01 NOTE — Telephone Encounter (Signed)
 Duplicate. PCP aware. Please see other telephone encounter for more information.

## 2024-07-01 NOTE — Telephone Encounter (Signed)
 Please call patient today and let him know that blood test showed his blood sugar to be quite elevated with increased acid buildup in the blood.  I saw him on 06/29/2024 and he was suppose to have picked up prescriptions from the pharmacy for Lantus  insulin  to take 30 units daily and Humalog  insulin  to take 5 units with meals to start taking that day.  Looks like he has not picked those up as yet.  If that is the case, he needs to pick up immediately or be seen in the emergency room. -Hold off on taking metformin  for 1 week. - Cholesterol level and triglyceride levels significantly elevated due to uncontrolled diabetes.  Will place him on medication to lower the triglyceride level called gemfibrozil .  I will have the pharmacy hold off on filling the rosuvastatin  for now. - Please return to the lab in 1 week for recheck.

## 2024-07-01 NOTE — Addendum Note (Signed)
 Addended by: VICCI SOBER B on: 07/01/2024 11:13 AM   Modules accepted: Orders

## 2024-07-01 NOTE — Telephone Encounter (Signed)
 Copied from CRM [id number]. Topic: Clinical - Lab/Test Results >> Jul 01, 2024  9:25 AM Silvana PARAS wrote:  Reason for CRM: Critical lab result from [facility] from [place] at [phone] option 1. Attempted to call twice with no response.

## 2024-07-01 NOTE — Telephone Encounter (Signed)
 Copied from CRM [id number]. Topic: Clinical - Lab/Test Results >> Jul 01, 2024  1:06 PM Geneva B wrote:  Reason for CRM: [facility] is calling with critical lab please call [phone] opt 1 reference number [phone]

## 2024-07-02 ENCOUNTER — Other Ambulatory Visit: Payer: Self-pay

## 2024-07-02 LAB — COMPREHENSIVE METABOLIC PANEL WITH GFR
ALT: 15 IU/L (ref 0–44)
AST: 14 IU/L (ref 0–40)
Albumin: 4.3 g/dL (ref 4.1–5.1)
Alkaline Phosphatase: 196 IU/L — ABNORMAL HIGH (ref 44–121)
BUN/Creatinine Ratio: 19 (ref 9–20)
BUN: 11 mg/dL (ref 6–24)
Bilirubin Total: <0.2 mg/dL (ref 0.0–1.2)
CO2: 21 mmol/L (ref 20–29)
Calcium: 9.3 mg/dL (ref 8.7–10.2)
Chloride: 94 mmol/L — ABNORMAL LOW (ref 96–106)
Creatinine, Ser: 0.58 mg/dL — ABNORMAL LOW (ref 0.76–1.27)
Globulin, Total: 2.6 g/dL (ref 1.5–4.5)
Glucose: 582 mg/dL (ref 70–99)
Potassium: 4.9 mmol/L (ref 3.5–5.2)
Sodium: 132 mmol/L — ABNORMAL LOW (ref 134–144)
Total Protein: 6.9 g/dL (ref 6.0–8.5)
eGFR: 121 mL/min/1.73 (ref >59)

## 2024-07-02 LAB — LIPID PANEL
Chol/HDL Ratio: 7.2 ratio — ABNORMAL HIGH (ref 0.0–5.0)
Cholesterol, Total: 282 mg/dL — ABNORMAL HIGH (ref 100–199)
HDL: 39 mg/dL — ABNORMAL LOW (ref >39)
Triglycerides: 841 mg/dL (ref 0–149)

## 2024-07-02 LAB — CBC
Hematocrit: 50.8 % (ref 37.5–51.0)
Hemoglobin: 16.4 g/dL (ref 13.0–17.7)
MCH: 30.7 pg (ref 26.6–33.0)
MCHC: 32.3 g/dL (ref 31.5–35.7)
MCV: 95 fL (ref 79–97)
Platelets: 225 x10E3/uL (ref 150–450)
RBC: 5.35 x10E6/uL (ref 4.14–5.80)
RDW: 12.1 % (ref 11.6–15.4)
WBC: 6.6 x10E3/uL (ref 3.4–10.8)

## 2024-07-02 LAB — MICROALBUMIN / CREATININE URINE RATIO
Microalb/Creat Ratio: 910 mg/g{creat} — ABNORMAL HIGH (ref 0–29)
Microalbumin, Urine: 210.2 ug/mL
Microalbumin, Urine: 210.2 ug/mL

## 2024-07-03 ENCOUNTER — Other Ambulatory Visit: Payer: Self-pay

## 2024-07-07 LAB — GAMMA GT: GGT: 27 IU/L (ref 0–65)

## 2024-07-07 LAB — MITOCHONDRIAL ANTIBODIES: Mitochondrial Ab: <20 U (ref 0.0–20.0)

## 2024-07-07 LAB — SPECIMEN STATUS REPORT

## 2024-07-17 ENCOUNTER — Other Ambulatory Visit (HOSPITAL_COMMUNITY): Payer: Self-pay

## 2024-07-28 ENCOUNTER — Ambulatory Visit: Payer: Self-pay | Admitting: Pharmacist

## 2024-08-31 ENCOUNTER — Ambulatory Visit: Payer: Self-pay | Admitting: Internal Medicine
